# Patient Record
Sex: Female | Born: 1980 | Race: Black or African American | Hispanic: No | Marital: Single | Smoking: Never smoker
Health system: Southern US, Community
[De-identification: ages and names within clinical notes are randomized; demographics above are authoritative.]

## PROBLEM LIST (undated history)

## (undated) DIAGNOSIS — J45909 Unspecified asthma, uncomplicated: Secondary | ICD-10-CM

## (undated) DIAGNOSIS — N62 Hypertrophy of breast: Secondary | ICD-10-CM

## (undated) DIAGNOSIS — Z9889 Other specified postprocedural states: Secondary | ICD-10-CM

## (undated) DIAGNOSIS — O10913 Unspecified pre-existing hypertension complicating pregnancy, third trimester: Secondary | ICD-10-CM

## (undated) DIAGNOSIS — F419 Anxiety disorder, unspecified: Secondary | ICD-10-CM

## (undated) DIAGNOSIS — O039 Complete or unspecified spontaneous abortion without complication: Secondary | ICD-10-CM

## (undated) DIAGNOSIS — R51 Headache: Secondary | ICD-10-CM

## (undated) DIAGNOSIS — R112 Nausea with vomiting, unspecified: Secondary | ICD-10-CM

## (undated) DIAGNOSIS — K219 Gastro-esophageal reflux disease without esophagitis: Secondary | ICD-10-CM

## (undated) DIAGNOSIS — D219 Benign neoplasm of connective and other soft tissue, unspecified: Secondary | ICD-10-CM

## (undated) DIAGNOSIS — R519 Headache, unspecified: Secondary | ICD-10-CM

## (undated) DIAGNOSIS — I10 Essential (primary) hypertension: Secondary | ICD-10-CM

## (undated) HISTORY — PX: NO PAST SURGERIES: SHX2092

## (undated) HISTORY — DX: Complete or unspecified spontaneous abortion without complication: O03.9

## (undated) HISTORY — DX: Benign neoplasm of connective and other soft tissue, unspecified: D21.9

---

## 2004-11-28 ENCOUNTER — Emergency Department: Payer: Self-pay | Admitting: Emergency Medicine

## 2004-12-06 ENCOUNTER — Emergency Department: Payer: Self-pay | Admitting: Emergency Medicine

## 2005-01-19 ENCOUNTER — Emergency Department: Payer: Self-pay | Admitting: Emergency Medicine

## 2005-11-04 ENCOUNTER — Emergency Department: Payer: Self-pay | Admitting: Internal Medicine

## 2006-02-15 ENCOUNTER — Emergency Department: Payer: Self-pay | Admitting: General Practice

## 2006-06-19 ENCOUNTER — Emergency Department: Payer: Self-pay | Admitting: Emergency Medicine

## 2007-03-18 ENCOUNTER — Emergency Department: Payer: Self-pay | Admitting: Emergency Medicine

## 2007-10-17 ENCOUNTER — Emergency Department: Payer: Self-pay | Admitting: Emergency Medicine

## 2007-12-24 ENCOUNTER — Emergency Department: Payer: Self-pay | Admitting: Emergency Medicine

## 2007-12-26 ENCOUNTER — Emergency Department: Payer: Self-pay | Admitting: Emergency Medicine

## 2008-02-18 ENCOUNTER — Emergency Department: Payer: Self-pay | Admitting: Emergency Medicine

## 2008-08-17 ENCOUNTER — Emergency Department: Payer: Self-pay | Admitting: Emergency Medicine

## 2008-09-15 ENCOUNTER — Emergency Department: Payer: Self-pay | Admitting: Emergency Medicine

## 2009-01-23 ENCOUNTER — Emergency Department: Payer: Self-pay | Admitting: Emergency Medicine

## 2009-03-10 ENCOUNTER — Emergency Department: Payer: Self-pay | Admitting: Emergency Medicine

## 2009-04-15 ENCOUNTER — Emergency Department: Payer: Self-pay | Admitting: Emergency Medicine

## 2009-04-28 ENCOUNTER — Emergency Department: Payer: Self-pay | Admitting: Emergency Medicine

## 2009-05-02 ENCOUNTER — Emergency Department: Payer: Self-pay | Admitting: Emergency Medicine

## 2009-05-20 ENCOUNTER — Emergency Department: Payer: Self-pay | Admitting: Internal Medicine

## 2009-05-31 ENCOUNTER — Emergency Department: Payer: Self-pay | Admitting: Internal Medicine

## 2009-07-10 ENCOUNTER — Emergency Department: Payer: Self-pay | Admitting: Emergency Medicine

## 2009-07-15 ENCOUNTER — Emergency Department: Payer: Self-pay | Admitting: Emergency Medicine

## 2009-08-01 ENCOUNTER — Emergency Department: Payer: Self-pay | Admitting: Emergency Medicine

## 2009-08-03 ENCOUNTER — Emergency Department: Payer: Self-pay | Admitting: Emergency Medicine

## 2009-08-07 ENCOUNTER — Emergency Department: Payer: Self-pay | Admitting: Emergency Medicine

## 2009-09-05 ENCOUNTER — Emergency Department: Payer: Self-pay | Admitting: Emergency Medicine

## 2009-10-23 ENCOUNTER — Emergency Department: Payer: Self-pay | Admitting: Emergency Medicine

## 2009-11-02 ENCOUNTER — Emergency Department: Payer: Self-pay | Admitting: Emergency Medicine

## 2009-12-09 ENCOUNTER — Emergency Department: Payer: Self-pay | Admitting: Emergency Medicine

## 2010-03-04 ENCOUNTER — Emergency Department: Payer: Self-pay | Admitting: Emergency Medicine

## 2010-04-20 ENCOUNTER — Emergency Department: Payer: Self-pay

## 2010-09-19 ENCOUNTER — Emergency Department: Payer: Self-pay | Admitting: Emergency Medicine

## 2010-12-07 ENCOUNTER — Emergency Department: Payer: Self-pay | Admitting: Unknown Physician Specialty

## 2011-07-26 ENCOUNTER — Emergency Department: Payer: Self-pay | Admitting: *Deleted

## 2011-07-29 ENCOUNTER — Emergency Department: Payer: Self-pay | Admitting: Internal Medicine

## 2012-08-01 ENCOUNTER — Emergency Department: Payer: Self-pay | Admitting: Emergency Medicine

## 2012-08-01 LAB — BASIC METABOLIC PANEL
BUN: 22 mg/dL — ABNORMAL HIGH (ref 7–18)
Chloride: 108 mmol/L — ABNORMAL HIGH (ref 98–107)
Co2: 27 mmol/L (ref 21–32)
Creatinine: 1.11 mg/dL (ref 0.60–1.30)
Osmolality: 290 (ref 275–301)
Potassium: 4.2 mmol/L (ref 3.5–5.1)

## 2012-08-01 LAB — URINALYSIS, COMPLETE
Ketone: NEGATIVE
Ph: 5 (ref 4.5–8.0)
Protein: 100
RBC,UR: 2776 /HPF (ref 0–5)
WBC UR: 285 /HPF (ref 0–5)

## 2012-08-01 LAB — CBC
HCT: 35.7 % (ref 35.0–47.0)
HGB: 12.3 g/dL (ref 12.0–16.0)
MCV: 89 fL (ref 80–100)
Platelet: 171 10*3/uL (ref 150–440)
RBC: 4.01 10*6/uL (ref 3.80–5.20)
RDW: 13.8 % (ref 11.5–14.5)
WBC: 5.4 10*3/uL (ref 3.6–11.0)

## 2012-08-01 LAB — PREGNANCY, URINE: Pregnancy Test, Urine: NEGATIVE m[IU]/mL

## 2012-10-08 ENCOUNTER — Emergency Department: Payer: Self-pay | Admitting: Emergency Medicine

## 2013-12-18 ENCOUNTER — Ambulatory Visit: Payer: Self-pay | Admitting: Family Medicine

## 2015-06-02 ENCOUNTER — Emergency Department
Admission: EM | Admit: 2015-06-02 | Discharge: 2015-06-02 | Disposition: A | Discharge status: Home / Self Care | Payer: BLUE CROSS/BLUE SHIELD | Attending: Emergency Medicine | Admitting: Emergency Medicine

## 2015-06-02 ENCOUNTER — Encounter: Payer: Self-pay | Admitting: Emergency Medicine

## 2015-06-02 DIAGNOSIS — N390 Urinary tract infection, site not specified: Secondary | ICD-10-CM | POA: Diagnosis not present

## 2015-06-02 DIAGNOSIS — Z3202 Encounter for pregnancy test, result negative: Secondary | ICD-10-CM | POA: Diagnosis not present

## 2015-06-02 DIAGNOSIS — R350 Frequency of micturition: Secondary | ICD-10-CM | POA: Diagnosis present

## 2015-06-02 DIAGNOSIS — Z79899 Other long term (current) drug therapy: Secondary | ICD-10-CM | POA: Diagnosis not present

## 2015-06-02 LAB — URINALYSIS COMPLETE WITH MICROSCOPIC (ARMC ONLY)
Bacteria, UA: NONE SEEN
Bilirubin Urine: NEGATIVE
Glucose, UA: NEGATIVE mg/dL
HGB URINE DIPSTICK: NEGATIVE
KETONES UR: NEGATIVE mg/dL
LEUKOCYTES UA: NEGATIVE
NITRITE: NEGATIVE
Protein, ur: NEGATIVE mg/dL
Specific Gravity, Urine: 1.021 (ref 1.005–1.030)
pH: 5 (ref 5.0–8.0)

## 2015-06-02 LAB — POCT PREGNANCY, URINE: Preg Test, Ur: NEGATIVE

## 2015-06-02 MED ORDER — PHENAZOPYRIDINE HCL 200 MG PO TABS
200.0000 mg | ORAL_TABLET | Freq: Three times a day (TID) | ORAL | Status: DC | PRN
Start: 2015-06-02 — End: 2017-06-21

## 2015-06-02 MED ORDER — SULFAMETHOXAZOLE-TRIMETHOPRIM 800-160 MG PO TABS: 1.0000 | ORAL_TABLET | Freq: Two times a day (BID) | ORAL | Status: DC

## 2015-06-02 MED ORDER — IBUPROFEN 800 MG PO TABS: 800.0000 mg | ORAL_TABLET | Freq: Three times a day (TID) | ORAL | Status: DC | PRN

## 2015-06-02 MED ORDER — METRONIDAZOLE 500 MG PO TABS: 1000.0000 mg | ORAL_TABLET | Freq: Two times a day (BID) | ORAL | Status: DC

## 2015-06-02 NOTE — ED Provider Notes (Signed)
[facility] Emergency Department Provider Note  ____________________________________________  Time seen: Approximately 3:54 PM  I have reviewed the triage vital signs and the nursing notes.   HISTORY  Chief Complaint Urinary Frequency    HPI Gabriela Thomas is a 34 y.o. female presents for evaluation of intermittent urinary frequency for 3 days. Patient states the symptoms started last week and had progressed to today. Complains of dysuria with minimal vaginal discharge.Has a past medical history the same and feels very certain to UTI.   History reviewed. No pertinent past medical history.  There are no active problems to display for this patient.   No past surgical history on file.  Current Outpatient Rx  Name  Route  Sig  Dispense  Refill  . ibuprofen (ADVIL,MOTRIN) 800 MG tablet   Oral   Take 1 tablet (800 mg total) by mouth every 8 (eight) hours as needed.   30 tablet   0   . metroNIDAZOLE (FLAGYL) 500 MG tablet   Oral   Take 2 tablets (1,000 mg total) by mouth 2 (two) times daily.   4 tablet   0   . phenazopyridine (PYRIDIUM) 200 MG tablet   Oral   Take 1 tablet (200 mg total) by mouth 3 (three) times daily as needed for pain.   6 tablet   0   . sulfamethoxazole-trimethoprim (BACTRIM DS,SEPTRA DS) 800-160 MG per tablet   Oral   Take 1 tablet by mouth 2 (two) times daily.   20 tablet   0     Allergies Review of patient's allergies indicates no known allergies.  No family history on file.  Social History History  Substance Use Topics  . Smoking status: Never Smoker   . Smokeless tobacco: Not on file  . Alcohol Use: No    Review of Systems Constitutional: No fever/chills Eyes: No visual changes. ENT: No sore throat. Cardiovascular: Denies chest pain. Respiratory: Denies shortness of breath. Gastrointestinal: No abdominal pain.  No nausea, no vomiting.  No diarrhea.  No constipation. Genitourinary: Positive for  dysuria. Musculoskeletal: Negative for back pain. Skin: Negative for rash. Neurological: Negative for headaches, focal weakness or numbness.  10-point ROS otherwise negative.  ____________________________________________   PHYSICAL EXAM:  VITAL SIGNS: ED Triage Vitals  Enc Vitals Group     BP 06/02/15 1530 150/107 mmHg     Pulse Rate 06/02/15 1530 87     Resp 06/02/15 1530 20     Temp 06/02/15 1530 98.7 F (37.1 C)     Temp Source 06/02/15 1530 Oral     SpO2 06/02/15 1530 97 %     Weight 06/02/15 1530 160 lb (72.576 kg)     Height 06/02/15 1530 5\' 5"  (1.651 m)     Head Cir --      Peak Flow --      Pain Score 06/02/15 1531 8     Pain Loc --      Pain Edu? --      Excl. in [place]? --     Constitutional: Alert and oriented. Well appearing and in no acute distress. Eyes: Conjunctivae are normal. PERRL. EOMI. Head: Atraumatic. Nose: No congestion/rhinnorhea. Mouth/Throat: Mucous membranes are moist.  Oropharynx non-erythematous. Neck: No stridor.   Cardiovascular: Normal rate, regular rhythm. Grossly normal heart sounds.  Good peripheral circulation. Respiratory: Normal respiratory effort.  No retractions. Lungs CTAB. Gastrointestinal: Soft and nontender. No distention. No abdominal bruits. No CVA tenderness. Musculoskeletal: No lower extremity tenderness nor edema.  No joint effusions. Neurologic:  Normal speech and language. No gross focal neurologic deficits are appreciated. Speech is normal. No gait instability. Skin:  Skin is warm, dry and intact. No rash noted. Psychiatric: Mood and affect are normal. Speech and behavior are normal.  ____________________________________________   LABS (all labs ordered are listed, but only abnormal results are displayed)  Labs Reviewed  URINALYSIS COMPLETEWITH MICROSCOPIC ([place] ONLY) - Abnormal; Notable for the following:    Color, Urine YELLOW (*)    APPearance HAZY (*)    Squamous Epithelial / LPF 6-30 (*)    All other  components within normal limits  POCT PREGNANCY, URINE  POC URINE PREG, ED   ____________________________________________  EKG  Deferred ____________________________________________  RADIOLOGY  Not applicable ____________________________________________   PROCEDURES  Procedure(s) performed: None  Critical Care performed: No  ____________________________________________   INITIAL IMPRESSION / ASSESSMENT AND PLAN / ED COURSE  Pertinent labs & imaging results that were available during my care of the patient were reviewed by me and considered in my medical decision making (see chart for details).  Symptoms consistent with UTI. We'll treat prophylactically with Bactrim DS twice a day Pyridium 200 mg 3 times a day and Motrin 800 mg 3 times a day. Flagyl was given at the patient's request for upcoming discharge. ____________________________________________   FINAL CLINICAL IMPRESSION(S) / ED DIAGNOSES  Final diagnoses:  UTI (lower urinary tract infection)      Arlyss Repress, PA-C 06/02/15 1639  Ponciano Ort, MD 06/03/15 0030

## 2015-06-02 NOTE — Discharge Instructions (Signed)

## 2015-06-02 NOTE — ED Notes (Signed)
No fevers

## 2015-06-02 NOTE — ED Notes (Signed)
Low back pain, low abd pain, abd presswure

## 2015-12-15 ENCOUNTER — Encounter: Payer: Self-pay | Admitting: Emergency Medicine

## 2015-12-15 ENCOUNTER — Emergency Department
Admission: EM | Admit: 2015-12-15 | Discharge: 2015-12-15 | Disposition: A | Discharge status: Home / Self Care | Payer: BLUE CROSS/BLUE SHIELD | Attending: Emergency Medicine | Admitting: Emergency Medicine

## 2015-12-15 DIAGNOSIS — IMO0001 Reserved for inherently not codable concepts without codable children: Secondary | ICD-10-CM

## 2015-12-15 DIAGNOSIS — J0110 Acute frontal sinusitis, unspecified: Secondary | ICD-10-CM | POA: Insufficient documentation

## 2015-12-15 DIAGNOSIS — R05 Cough: Secondary | ICD-10-CM | POA: Diagnosis present

## 2015-12-15 DIAGNOSIS — R030 Elevated blood-pressure reading, without diagnosis of hypertension: Secondary | ICD-10-CM | POA: Diagnosis not present

## 2015-12-15 MED ORDER — AMOXICILLIN-POT CLAVULANATE 875-125 MG PO TABS: 1.0000 | ORAL_TABLET | Freq: Two times a day (BID) | ORAL | Status: AC

## 2015-12-15 NOTE — ED Provider Notes (Signed)
[facility] Emergency Department Provider Note  ____________________________________________  Time seen: Approximately 2:15 PM  I have reviewed the triage vital signs and the nursing notes.   HISTORY  Chief Complaint Cough and Nasal Congestion   HPI Gabriela Thomas is a 34 y.o. female is here with complaint of cough congestion for 2 months. Patient states that 2-3 weeks ago she saw her primary care doctorand was placed on some allergy medicine "nasal spray". Patient states that the cough has continued and she has pressure around her face. She states that her ears feel  full and has pressure. Patient is unaware of fever or chills. She denies any past history of bronchitis or pneumonia. Patient is a nonsmoker. Currently she rates her headache and facial pain as 5 out of 10. Facial discomfort is constant.   History reviewed. No pertinent past medical history.  There are no active problems to display for this patient.   History reviewed. No pertinent past surgical history.  Current Outpatient Rx  Name  Route  Sig  Dispense  Refill  . amoxicillin-clavulanate (AUGMENTIN) 875-125 MG tablet   Oral   Take 1 tablet by mouth 2 (two) times daily.   20 tablet   0   . phenazopyridine (PYRIDIUM) 200 MG tablet   Oral   Take 1 tablet (200 mg total) by mouth 3 (three) times daily as needed for pain.   6 tablet   0     Allergies Review of patient's allergies indicates no known allergies.  History reviewed. No pertinent family history.  Social History Social History  Substance Use Topics  . Smoking status: Never Smoker   . Smokeless tobacco: None  . Alcohol Use: No    Review of Systems Constitutional: Unaware of fever/chills Eyes: No visual changes. ENT: No sore throat. Positive facial pain. Positive nasal congestion Cardiovascular: Denies chest pain. Respiratory: Denies shortness of breath. Positive cough Gastrointestinal: No abdominal pain.  No  nausea, no vomiting.  No diarrhea.  No constipation. Musculoskeletal: Negative for back pain. Skin: Negative for rash. Neurological: Positive for normal headaches, no focal weakness or numbness.  10-point ROS otherwise negative.  ____________________________________________   PHYSICAL EXAM:  VITAL SIGNS: ED Triage Vitals  Enc Vitals Group     BP 12/15/15 1343 141/98 mmHg     Pulse Rate 12/15/15 1343 80     Resp 12/15/15 1343 20     Temp 12/15/15 1343 98.8 F (37.1 C)     Temp Source 12/15/15 1343 Oral     SpO2 12/15/15 1343 100 %     Weight 12/15/15 1343 163 lb (73.936 kg)     Height 12/15/15 1343 5\' 5"  (1.651 m)     Head Cir --      Peak Flow --      Pain Score 12/15/15 1344 5     Pain Loc --      Pain Edu? --      Excl. in [place]? --     Constitutional: Alert and oriented. Well appearing and in no acute distress. Eyes: Conjunctivae are normal. PERRL. EOMI. Head: Atraumatic.   Moderate frontal sinus tenderness to percussion. Nose: Mild congestion/rhinnorhea.  EACs are clear bilaterally. TMs are dull with poor light reflex bilaterally. Mouth/Throat: Mucous membranes are moist.  Oropharynx non-erythematous. Posterior drainage present. Neck: No stridor.   Hematological/Lymphatic/Immunilogical: No cervical lymphadenopathy. Cardiovascular: Normal rate, regular rhythm. Grossly normal heart sounds.  Good peripheral circulation. Respiratory: Normal respiratory effort.  No retractions. Lungs CTAB.  Gastrointestinal: Soft and nontender. No distention. Musculoskeletal: Moves upper and lower extremities without any difficulty. Normal gait was noted. Neurologic:  Normal speech and language. No gross focal neurologic deficits are appreciated. No gait instability. Skin:  Skin is warm, dry and intact. No rash noted. Psychiatric: Mood and affect are normal. Speech and behavior are normal.  ____________________________________________   LABS (all labs ordered are listed, but only abnormal  results are displayed)  Labs Reviewed - No data to display   PROCEDURES  Procedure(s) performed: None  Critical Care performed: No  ____________________________________________   INITIAL IMPRESSION / ASSESSMENT AND PLAN / ED COURSE  Pertinent labs & imaging results that were available during my care of the patient were reviewed by me and considered in my medical decision making (see chart for details).   Patient is to continue taking medication for allergies per her medical doctor. She is started on Augmentin 875 twice a day for 10 days. She is to follow-up with [facility] if any continued problems with her sinuses. ____________________________________________   FINAL CLINICAL IMPRESSION(S) / ED DIAGNOSES  Final diagnoses:  Acute frontal sinusitis, recurrence not specified  Elevated blood pressure      Johnn Hai, PA-C 12/15/15 1438  Johnn Hai, PA-C 12/15/15 1439  Daymon Larsen, MD 12/15/15 [phone]

## 2015-12-15 NOTE — ED Notes (Signed)
Reports cough and congestion x 2 months.  States she was given allergy med by her MD but not better.

## 2015-12-15 NOTE — Discharge Instructions (Signed)
Sinusitis, Adult Sinusitis is redness, soreness, and puffiness (inflammation) of the air pockets in the bones of your face (sinuses). The redness, soreness, and puffiness can cause air and mucus to get trapped in your sinuses. This can allow germs to grow and cause an infection.  HOME CARE   Drink enough fluids to keep your pee (urine) clear or pale yellow.  Use a humidifier in your home.  Run a hot shower to create steam in the bathroom. Sit in the bathroom with the door closed. Breathe in the steam 3-4 times a day.  Put a warm, moist washcloth on your face 3-4 times a day, or as told by your doctor.  Use salt water sprays (saline sprays) to wet the thick fluid in your nose. This can help the sinuses drain.  Only take medicine as told by your doctor. GET HELP RIGHT AWAY IF:   Your pain gets worse.  You have very bad headaches.  You are sick to your stomach (nauseous).  You throw up (vomit).  You are very sleepy (drowsy) all the time.  Your face is puffy (swollen).  Your vision changes.  You have a stiff neck.  You have trouble breathing. MAKE SURE YOU:   Understand these instructions.  Will watch your condition.  Will get help right away if you are not doing well or get worse.   This information is not intended to replace advice given to you by your health care provider. Make sure you discuss any questions you have with your health care provider.   Document Released: 05/31/2008 Document Revised: 01/03/2015 Document Reviewed: 07/18/2012 Elsevier Interactive Patient Education 2016 [place] with Dr. Tami Ribas if any continued problems. Continue your allergy medicine as prescribed by your doctor. Augmentin 875 twice a day for 10 days. Tylenol or ibuprofen as needed for headache or aches.

## 2016-10-07 ENCOUNTER — Emergency Department
Admission: EM | Admit: 2016-10-07 | Discharge: 2016-10-07 | Disposition: A | Discharge status: Home / Self Care | Payer: BLUE CROSS/BLUE SHIELD | Attending: Emergency Medicine | Admitting: Emergency Medicine

## 2016-10-07 ENCOUNTER — Encounter: Payer: Self-pay | Admitting: Emergency Medicine

## 2016-10-07 DIAGNOSIS — I10 Essential (primary) hypertension: Secondary | ICD-10-CM | POA: Insufficient documentation

## 2016-10-07 DIAGNOSIS — R300 Dysuria: Secondary | ICD-10-CM | POA: Diagnosis present

## 2016-10-07 DIAGNOSIS — N3090 Cystitis, unspecified without hematuria: Secondary | ICD-10-CM

## 2016-10-07 HISTORY — DX: Essential (primary) hypertension: I10

## 2016-10-07 LAB — URINALYSIS COMPLETE WITH MICROSCOPIC (ARMC ONLY)
Bacteria, UA: NONE SEEN
Bilirubin Urine: NEGATIVE
Glucose, UA: NEGATIVE mg/dL
KETONES UR: NEGATIVE mg/dL
LEUKOCYTES UA: NEGATIVE
NITRITE: POSITIVE — AB
PH: 5 (ref 5.0–8.0)
Protein, ur: 30 mg/dL — AB
SPECIFIC GRAVITY, URINE: 1.025 (ref 1.005–1.030)

## 2016-10-07 LAB — POCT PREGNANCY, URINE: Preg Test, Ur: NEGATIVE

## 2016-10-07 MED ORDER — CEPHALEXIN 500 MG PO CAPS: 500.0000 mg | ORAL_CAPSULE | Freq: Three times a day (TID) | ORAL | 0 refills | Status: DC

## 2016-10-07 NOTE — ED Provider Notes (Signed)
[facility] Emergency Department Provider Note  ____________________________________________   First MD Initiated Contact with Patient 10/07/16 [phone]     (approximate)  I have reviewed the triage vital signs and the nursing notes.   HISTORY  Chief Complaint Dysuria and Back Pain    HPI Gabriela Thomas is a 35 y.o. female is here with complaint of dysuria, frequency and back pain. Patient states it began yesterday as a pressure sensation which increased during the evening. Patient denies any fever or chills. There's been no nausea vomiting. Patient states that she does have a history of urinary tract infections but has never had any problems having them treated. Currently she rates her discomfort as 6/10.   Past Medical History:  Diagnosis Date  . Hypertension     There are no active problems to display for this patient.   No past surgical history on file.  Prior to Admission medications   Medication Sig Start Date End Date Taking? Authorizing Provider  cephALEXin (KEFLEX) 500 MG capsule Take 1 capsule (500 mg total) by mouth 3 (three) times daily. 10/07/16   Johnn Hai, PA-C  phenazopyridine (PYRIDIUM) 200 MG tablet Take 1 tablet (200 mg total) by mouth 3 (three) times daily as needed for pain. 06/02/15   Arlyss Repress, PA-C    Allergies Review of patient's allergies indicates no known allergies.  No family history on file.  Social History Social History  Substance Use Topics  . Smoking status: Never Smoker  . Smokeless tobacco: Not on file  . Alcohol use No    Review of Systems Constitutional: No fever/chills Cardiovascular: Denies chest pain. Respiratory: Denies shortness of breath. Gastrointestinal: No abdominal pain.  No nausea, no vomiting.  Genitourinary: Positive for dysuria Musculoskeletal: Negative for back pain. Skin: Negative for rash. Neurological: Negative for headaches, focal weakness or numbness.  10-point ROS  otherwise negative.  ____________________________________________   PHYSICAL EXAM:  VITAL SIGNS: ED Triage Vitals [10/07/16 0710]  Enc Vitals Group     BP (!) 147/95     Pulse Rate 98     Resp 20     Temp 98.4 F (36.9 C)     Temp Source Oral     SpO2 99 %     Weight 165 lb (74.8 kg)     Height 5\' 5"  (1.651 m)     Head Circumference      Peak Flow      Pain Score 6     Pain Loc      Pain Edu?      Excl. in [place]?     Constitutional: Alert and oriented. Well appearing and in no acute distress. Eyes: Conjunctivae are normal. PERRL. EOMI. Head: Atraumatic. Nose: No congestion/rhinnorhea. Neck: No stridor.   Cardiovascular: Normal rate, regular rhythm. Grossly normal heart sounds.  Good peripheral circulation. Respiratory: Normal respiratory effort.  No retractions. Lungs CTAB. Gastrointestinal: Soft and nontender. No distention.  No CVA tenderness. Musculoskeletal: Moves upper and lower extremities without any difficulty. Neurologic:  Normal speech and language. No gross focal neurologic deficits are appreciated. Normal gait was noted. Skin:  Skin is warm, dry and intact. No rash noted. Psychiatric: Mood and affect are normal. Speech and behavior are normal.  ____________________________________________   LABS (all labs ordered are listed, but only abnormal results are displayed)  Labs Reviewed  URINALYSIS COMPLETEWITH MICROSCOPIC ([place]) - Abnormal; Notable for the following:       Result Value   Color,  Urine AMBER (*)    APPearance CLEAR (*)    Hgb urine dipstick 1+ (*)    Protein, ur 30 (*)    Nitrite POSITIVE (*)    Squamous Epithelial / LPF 0-5 (*)    All other components within normal limits  POC URINE PREG, ED  POCT PREGNANCY, URINE    PROCEDURES  Procedure(s) performed: None  Procedures  Critical Care performed: No  ____________________________________________   INITIAL IMPRESSION / ASSESSMENT AND PLAN / ED COURSE  Pertinent labs &  imaging results that were available during my care of the patient were reviewed by me and considered in my medical decision making (see chart for details).    Clinical Course   Patient was encouraged to increase fluids. Patient was started on Keflex 500 mg 3 times a day for 10 days. She will continue Pyridium that she has at home when necessary. Culture and sensitivity was done.  ____________________________________________   FINAL CLINICAL IMPRESSION(S) / ED DIAGNOSES  Final diagnoses:  Cystitis      NEW MEDICATIONS STARTED DURING THIS VISIT:  Discharge Medication List as of 10/07/2016  8:03 AM    START taking these medications   Details  cephALEXin (KEFLEX) 500 MG capsule Take 1 capsule (500 mg total) by mouth 3 (three) times daily., Starting Thu 10/07/2016, Print         Note:  This document was prepared using Dragon voice recognition software and may include unintentional dictation errors.    Johnn Hai, PA-C 10/07/16 [id number]    Carrie Mew, MD 10/07/16 1515

## 2016-10-07 NOTE — Discharge Instructions (Signed)
Follow-up with your primary care doctor if any continued problems. Increase fluids. Take all of antibiotics for 10 days.

## 2016-10-07 NOTE — ED Triage Notes (Signed)
Pt reports started with dysuria, urinary frequency and back pain and now has come pressure. Reports feels like a UTI.

## 2016-10-14 ENCOUNTER — Encounter: Payer: Self-pay | Admitting: Emergency Medicine

## 2016-10-14 ENCOUNTER — Emergency Department: Payer: BLUE CROSS/BLUE SHIELD

## 2016-10-14 ENCOUNTER — Emergency Department
Admission: EM | Admit: 2016-10-14 | Discharge: 2016-10-14 | Disposition: A | Discharge status: Home / Self Care | Payer: BLUE CROSS/BLUE SHIELD | Attending: Emergency Medicine | Admitting: Emergency Medicine

## 2016-10-14 DIAGNOSIS — R300 Dysuria: Secondary | ICD-10-CM

## 2016-10-14 DIAGNOSIS — Z79899 Other long term (current) drug therapy: Secondary | ICD-10-CM | POA: Diagnosis not present

## 2016-10-14 DIAGNOSIS — N939 Abnormal uterine and vaginal bleeding, unspecified: Secondary | ICD-10-CM | POA: Diagnosis present

## 2016-10-14 DIAGNOSIS — D259 Leiomyoma of uterus, unspecified: Secondary | ICD-10-CM | POA: Diagnosis not present

## 2016-10-14 DIAGNOSIS — I10 Essential (primary) hypertension: Secondary | ICD-10-CM | POA: Insufficient documentation

## 2016-10-14 LAB — CBC
HCT: 36.2 % (ref 35.0–47.0)
HEMOGLOBIN: 12.4 g/dL (ref 12.0–16.0)
MCH: 28.7 pg (ref 26.0–34.0)
MCHC: 34.1 g/dL (ref 32.0–36.0)
MCV: 84 fL (ref 80.0–100.0)
Platelets: 215 10*3/uL (ref 150–440)
RBC: 4.31 MIL/uL (ref 3.80–5.20)
RDW: 14.5 % (ref 11.5–14.5)
WBC: 3.2 10*3/uL — ABNORMAL LOW (ref 3.6–11.0)

## 2016-10-14 LAB — HCG, QUANTITATIVE, PREGNANCY: hCG, Beta Chain, Quant, S: <1 m[IU]/mL (ref <5)

## 2016-10-14 MED ORDER — CIPROFLOXACIN HCL 500 MG PO TABS: 500.0000 mg | ORAL_TABLET | Freq: Two times a day (BID) | ORAL | 0 refills | Status: AC

## 2016-10-14 MED ORDER — MEDROXYPROGESTERONE ACETATE 10 MG PO TABS: 10.0000 mg | ORAL_TABLET | Freq: Every day | ORAL | 0 refills | Status: DC

## 2016-10-14 NOTE — ED Triage Notes (Signed)
Patient presents to the ED with heavy vaginal bleeding that began yesterday.  Patient states her last period started on October 6th and went off on October 12th.  Patient states she was seen on the 14th in the ED and diagnosed with a UTI and prescribed Keflex.  Patient states she started passing blood clots yesterday and is complaining of pelvic, "pressure".  Patient states, "I was thinking it could be this medicine."  Patient denies pain and is in no obvious distress at this time.  Patient's skin is normal color for ethnicity.  Patient reports changing pads approx. Every hour x 2 days.  Patient reports lower back pain as well.

## 2016-10-14 NOTE — ED Notes (Signed)
Pt in via triage with complaints of heavy vaginal bleeding beginning yesterday, "passing clots."  Pt reports last menstrual cycle ending on 10/07/16 and being a normal cycle.  Pt was recently placed on Keflex to treat a UTI, pt concerned that is what is causing her bleeding.  Pt A/Ox4, no immediate distress noted at this time.

## 2016-10-14 NOTE — ED Provider Notes (Signed)
[facility] Emergency Department Provider Note  ____________________________________________  Time seen: Approximately 11:23 AM  I have reviewed the triage vital signs and the nursing notes.   HISTORY  Chief Complaint Vaginal Bleeding    HPI Gabriela Thomas is a 35 y.o. female, NAD, presents to the emergency department with 2 day history of heavy vaginal bleeding.  States the vaginal bleeding starting yesterday and was initially light but then progressively worsened and became heavy with passage of clots.  Notes some diffuse abdominal pressure but states she has a history of constipation at this is similar for such. She was seen in this ED on 10/07/2016 and was treated for a UTI with Keflex.  She works in the [employer] and was told by a pharmacist that Keflex can cause vaginal bleeding and that she needed to come to the emergency department for evaluation.  Her LMP was around October 1st, lasted for approximately 6 days and was normal for her cycles.  She went off birth control in August due to break through bleeding.  She has been having unprotected sex with her boyfriend and the last sexual encounter was over a week ago. Took a home pregnancy test last night which was negative. Last Pap smear was 3 years ago, normal and states she has never had an abnormal pap smear. Next pap smear is due before the end of this year.  Continues to have mild dysuria with increased urinary frequency. Denies any flank pain or back pain. Denies saddle paresthesias or loss of bowel or bladder control. Denies any fevers or chills. Has had no chest pain or shortness of breath.    Past Medical History:  Diagnosis Date  . Hypertension     There are no active problems to display for this patient.   History reviewed. No pertinent surgical history.  Prior to Admission medications   Medication Sig Start Date End Date Taking? Authorizing Provider  cephALEXin (KEFLEX) 500 MG capsule  Take 1 capsule (500 mg total) by mouth 3 (three) times daily. 10/07/16   Johnn Hai, PA-C  ciprofloxacin (CIPRO) 500 MG tablet Take 1 tablet (500 mg total) by mouth 2 (two) times daily. 10/14/16 10/17/16  Bach Rocchi L Kester Stimpson, PA-C  medroxyPROGESTERone (PROVERA) 10 MG tablet Take 1 tablet (10 mg total) by mouth daily. 10/14/16 10/24/16  Kenzy Campoverde L Aubrielle Stroud, PA-C  phenazopyridine (PYRIDIUM) 200 MG tablet Take 1 tablet (200 mg total) by mouth 3 (three) times daily as needed for pain. 06/02/15   Arlyss Repress, PA-C    Allergies Review of patient's allergies indicates no known allergies.  No family history on file.  Social History Social History  Substance Use Topics  . Smoking status: Never Smoker  . Smokeless tobacco: Never Used  . Alcohol use No     Comment: sometimes     Review of Systems  Constitutional: No fever/chills Cardiovascular: No chest pain. Respiratory: No shortness of breath.  Gastrointestinal: Positive diffuse abdominal pressure with history of constipation.  No nausea, vomiting.  No diarrhea.   Genitourinary: Positive for dysuria, vaginal bleeding. No hematuria, urinary hesitancy, urgency or increased frequency. Musculoskeletal: Negative for lower back pain nor flank pain.  Skin: Negative for rash, skin sores. Neurological: Negative for saddle paresthesias nor loss of bowel or bladder control. 10-point ROS otherwise negative.  ____________________________________________   PHYSICAL EXAM:  VITAL SIGNS: ED Triage Vitals  Enc Vitals Group     BP 10/14/16 0950 (!) 142/96     Pulse Rate  10/14/16 0950 93     Resp 10/14/16 0950 20     Temp 10/14/16 0950 98.1 F (36.7 C)     Temp Source 10/14/16 0950 Oral     SpO2 10/14/16 0950 99 %     Weight 10/14/16 0956 165 lb (74.8 kg)     Height 10/14/16 0956 5\' 5"  (1.651 m)     Head Circumference --      Peak Flow --      Pain Score 10/14/16 0957 4     Pain Loc --      Pain Edu? --      Excl. in [place]? --       Constitutional: Alert and oriented. Well appearing and in no acute distress. Eyes: Conjunctivae are normal without icterus or injection Head: Atraumatic. Hematological/Lymphatic/Immunilogical: No cervical lymphadenopathy. Cardiovascular: Normal rate, regular rhythm. Normal S1 and S2. No murmurs, rubs, gallops. Good peripheral circulation. Respiratory: Normal respiratory effort without tachypnea or retractions. Lungs CTAB with breath sounds noted in all lung fields. No wheeze, rhonchi nor rales. Gastrointestinal: Soft and nontender without distention or guarding. No rigidity or rebound. Upper border of uterus is palpable. No CVA tenderness. Musculoskeletal: No lower extremity tenderness nor edema.  No joint effusions. Neurologic:  Normal speech and language. No gross focal neurologic deficits are appreciated.  Skin:  Skin is warm, dry and intact. No rash noted. Psychiatric: Mood and affect are normal. Speech and behavior are normal. Patient exhibits appropriate insight and judgement.   ____________________________________________   LABS (all labs ordered are listed, but only abnormal results are displayed)  Labs Reviewed  CBC - Abnormal; Notable for the following:       Result Value   WBC 3.2 (*)    All other components within normal limits  HCG, QUANTITATIVE, PREGNANCY   ____________________________________________  EKG  None ____________________________________________  RADIOLOGY I, Judithe Modest Teressa Mcglocklin, personally viewed and evaluated these images (plain radiographs) as part of my medical decision making, as well as reviewing the written report by the radiologist.  US Transvaginal Non-ob  Result Date: 10/14/2016 CLINICAL DATA:  Vaginal bleeding for 1 day. LMP 10/01/2016. Premenopausal. EXAM: TRANSABDOMINAL AND TRANSVAGINAL ULTRASOUND OF PELVIS TECHNIQUE: Both transabdominal and transvaginal ultrasound examinations of the pelvis were performed. Transabdominal technique was  performed for global imaging of the pelvis including uterus, ovaries, adnexal regions, and pelvic cul-de-sac. It was necessary to proceed with endovaginal exam following the transabdominal exam to visualize the uterus, endometrium, ovaries. COMPARISON:  Date 08/17/2008 FINDINGS: Uterus Measurements: At least 14.5 x 9.3 x 10.3 cm. Multiple fibroids are present. Three are measured. Large fundal fibroid is 8.0 x 6.4 x 8.7 cm. Right-sided fibroid is 4.7 x 3.6 x 4.3 cm. Left-sided fibroid is 1.7 x 1.1 x 2.2 cm. A heterogeneous mixed echogenicity structure is identified in the region of the cervix, likely representing a nabothian cyst. Endometrium Thickness: 2.3 mm. Fluid is identified within the endometrial canal. Right ovary Measurements: 3.9 x 2.5 x 2.3 cm. Normal appearance/no adnexal mass. Left ovary Measurements: 4.6 x 2.5 x 2.1 cm. Normal appearance/no adnexal mass. Other findings Trace free pelvic fluid is likely physiologic. IMPRESSION: 1. Enlarged uterus containing multiple fibroids. Largest fibroid is 8.7 cm. 2. Normal appearance of both ovaries. 3. Suspect nabothian cyst in the region of the cervix. 4. Normal thickness of the endometrium; fluid within the canal. If bleeding remains unresponsive to hormonal or medical therapy, sonohysterogram should be considered for focal lesion work-up. (Ref: Radiological Reasoning: Algorithmic Workup of Abnormal Vaginal  Bleeding with Endovaginal Sonography and Sonohysterography. AJR [id number]) Electronically Signed   By: Nolon Nations M.D.   On: 10/14/2016 13:19   US Pelvis Complete  Result Date: 10/14/2016 CLINICAL DATA:  Vaginal bleeding for 1 day. LMP 10/01/2016. Premenopausal. EXAM: TRANSABDOMINAL AND TRANSVAGINAL ULTRASOUND OF PELVIS TECHNIQUE: Both transabdominal and transvaginal ultrasound examinations of the pelvis were performed. Transabdominal technique was performed for global imaging of the pelvis including uterus, ovaries, adnexal regions, and  pelvic cul-de-sac. It was necessary to proceed with endovaginal exam following the transabdominal exam to visualize the uterus, endometrium, ovaries. COMPARISON:  Date 08/17/2008 FINDINGS: Uterus Measurements: At least 14.5 x 9.3 x 10.3 cm. Multiple fibroids are present. Three are measured. Large fundal fibroid is 8.0 x 6.4 x 8.7 cm. Right-sided fibroid is 4.7 x 3.6 x 4.3 cm. Left-sided fibroid is 1.7 x 1.1 x 2.2 cm. A heterogeneous mixed echogenicity structure is identified in the region of the cervix, likely representing a nabothian cyst. Endometrium Thickness: 2.3 mm. Fluid is identified within the endometrial canal. Right ovary Measurements: 3.9 x 2.5 x 2.3 cm. Normal appearance/no adnexal mass. Left ovary Measurements: 4.6 x 2.5 x 2.1 cm. Normal appearance/no adnexal mass. Other findings Trace free pelvic fluid is likely physiologic. IMPRESSION: 1. Enlarged uterus containing multiple fibroids. Largest fibroid is 8.7 cm. 2. Normal appearance of both ovaries. 3. Suspect nabothian cyst in the region of the cervix. 4. Normal thickness of the endometrium; fluid within the canal. If bleeding remains unresponsive to hormonal or medical therapy, sonohysterogram should be considered for focal lesion work-up. (Ref: Radiological Reasoning: Algorithmic Workup of Abnormal Vaginal Bleeding with Endovaginal Sonography and Sonohysterography. AJR [id number]) Electronically Signed   By: Nolon Nations M.D.   On: 10/14/2016 13:19    ____________________________________________    PROCEDURES  Procedure(s) performed: None   Procedures   Medications - No data to display   ____________________________________________   INITIAL IMPRESSION / ASSESSMENT AND PLAN / ED COURSE  Pertinent labs & imaging results that were available during my care of the patient were reviewed by me and considered in my medical decision making (see chart for details).  Clinical Course  Comment By Time  OB/GYN on call has been  paged to discuss follow-up for this patient. Braxton Feathers, PA-C 10/19 1350  I spoke with Dr. Clearnce Hasten in regards to the patient. We will discharge her home on oral Provera 10mg  daily x 10 days and have follow up with OBGYN.  Braxton Feathers, PA-C 10/19 1408  Imaging results were discussed with patient. She states that she has had uterine fibroids for many years. States that the last time she had any imaging they were quite small which was years ago. States that over the last few months the first 2-3 days of her menstrual cycle were heavier than previous years. States that she normally sees her primary care provider at [facility] for Pap smears and annual physicals. Explained to the patient that she would need to follow up with Dr. Leafy Ro in OB/GYN for further evaluation and treatment of abnormal uterine bleeding and fibroids. Considering patient has continued to experience dysuria, as well as discontinued Keflex 2 days ago, we will give her a prescription for Cipro 500 mg to take twice a day 3 days to ensure urinary tract infection is eradicated. Braxton Feathers, PA-C 10/19 1430    Patient's diagnosis is consistent with abnormal uterine bleeding and uterine leiomyomas and dysuria. Patient will be discharged home with prescriptions  for Provera and Cipro to take as directed. Patient is to follow up with Dr. Leafy Ro in [facility] for further evaluation and treatment. Patient is given ED precautions to return to the ED for any worsening or new symptoms.    ____________________________________________  FINAL CLINICAL IMPRESSION(S) / ED DIAGNOSES  Final diagnoses:  Abnormal uterine bleeding (AUB)  Uterine leiomyoma, unspecified location  Dysuria      NEW MEDICATIONS STARTED DURING THIS VISIT:  Discharge Medication List as of 10/14/2016  2:28 PM    START taking these medications   Details  ciprofloxacin (CIPRO) 500 MG tablet Take 1 tablet (500 mg total) by mouth 2 (two) times  daily., Starting Thu 10/14/2016, Until Sun 10/17/2016, Print    medroxyPROGESTERone (PROVERA) 10 MG tablet Take 1 tablet (10 mg total) by mouth daily., Starting Thu 10/14/2016, Until Sun 10/24/2016, [place], PA-C 10/14/16 1603    Orbie Pyo, MD 10/14/16 [phone]

## 2017-06-21 ENCOUNTER — Encounter: Payer: Self-pay | Admitting: Obstetrics and Gynecology

## 2017-06-21 ENCOUNTER — Ambulatory Visit (INDEPENDENT_AMBULATORY_CARE_PROVIDER_SITE_OTHER): Payer: BLUE CROSS/BLUE SHIELD | Admitting: Obstetrics and Gynecology

## 2017-06-21 VITALS — BP 170/104 | HR 94 | Ht 65.0 in | Wt 172.0 lb

## 2017-06-21 DIAGNOSIS — D259 Leiomyoma of uterus, unspecified: Secondary | ICD-10-CM

## 2017-06-21 DIAGNOSIS — I10 Essential (primary) hypertension: Secondary | ICD-10-CM | POA: Diagnosis not present

## 2017-06-21 DIAGNOSIS — N920 Excessive and frequent menstruation with regular cycle: Secondary | ICD-10-CM | POA: Diagnosis not present

## 2017-06-21 NOTE — Progress Notes (Signed)
GYNECOLOGY PROGRESS NOTE  Subjective:    Patient ID: Gabriela Thomas, female    DOB: [birth year], 36 y.o.   MRN: [id number]  HPI  Patient is a 36 y.o. G4P1001 female who presents for second opinion for management of uterine fibroids. Has been seen by [facility]. Patient currently receiving Depo-Lupron with add back therapy for symptomatic fibroids (this is her 3rd month) with an initial episode of heavy vaginal bleeding in occurring in October 2017 (seen in the ER at that time).  She has a h/o HTN.    She notes that she does not like the side effects of the Lupron (most significantly noting extreme skin sensitivity, reporting that even her clothes that she wears irritates her skin and feels like something is crawling on her or irritating hr skin).  Does also note a few hot flushes, however this has improved with the initiation of the add-back therapy last month.  Desires to discuss other options.  Patient does note that desires future fertility.  States that she tried Depo Provera in the past (at least 10 years ago for contraception, and noted continuous bleeding for the first month, but afterwards was fine, but still discontinued use).  Notes that she might be willing to try this again. Also inquires about the Nexplanon.    Past Medical History:  Diagnosis Date  . Fibroid   . Hypertension     OB History  Gravida Para Term Preterm AB Living  1 1 1     1   SAB TAB Ectopic Multiple Live Births          1    # Outcome Date GA Lbr Len/2nd Weight Sex Delivery Anes PTL Lv  1 Term 1999 [redacted]w[redacted]d   F Vag-Spont   LIV      Family History  Problem Relation Age of Onset  . Hypertension Mother   . Diabetes Mother   . Congestive Heart Failure Mother   . Hypertension Maternal Aunt   . Hypertension Maternal Uncle   . Hypertension Maternal Grandmother   . Hypertension Maternal Grandfather     Social History   Social History  . Marital status: Single    Spouse name: N/A  .  Number of children: N/A  . Years of education: N/A   Occupational History  . Not on file.   Social History Main Topics  . Smoking status: Never Smoker  . Smokeless tobacco: Never Used  . Alcohol use No     Comment: sometimes  . Drug use: No  . Sexual activity: Yes    Birth control/ protection: None, Condom   Other Topics Concern  . Not on file   Social History Narrative  . No narrative on file    No current outpatient prescriptions on file prior to visit.   No current facility-administered medications on file prior to visit.     No Known Allergies   Review of Systems Pertinent items noted in HPI and remainder of comprehensive ROS otherwise negative.   Objective:   Blood pressure (!) 170/104, pulse 94, height 5\' 5"  (1.651 m), weight 172 lb (78 kg). General appearance: alert and no distress Abdomen: soft, non-tender. Palpable central mass, mobile, extending from the pelvis up to ~ 2 cm below the umbilicus.  Pelvic: deferred Extremities: extremities normal, atraumatic, no cyanosis or edema  Skin: No rashes, ulcers or skin lesions noted. No excessive hirsutism or acne noted.  Neurologic: Grossly normal    Imaging:  CLINICAL  DATA:  Vaginal bleeding for 1 day. LMP 10/01/2016. Premenopausal.  EXAM: TRANSABDOMINAL AND TRANSVAGINAL ULTRASOUND OF PELVIS  TECHNIQUE: Both transabdominal and transvaginal ultrasound examinations of the pelvis were performed. Transabdominal technique was performed for global imaging of the pelvis including uterus, ovaries, adnexal regions, and pelvic cul-de-sac. It was necessary to proceed with endovaginal exam following the transabdominal exam to visualize the uterus, endometrium, ovaries.  COMPARISON:  Date 08/17/2008  FINDINGS: Uterus  Measurements: At least 14.5 x 9.3 x 10.3 cm. Multiple fibroids are present. Three are measured. Large fundal fibroid is 8.0 x 6.4 x 8.7 cm. Right-sided fibroid is 4.7 x 3.6 x 4.3 cm.  Left-sided fibroid is 1.7 x 1.1 x 2.2 cm.  A heterogeneous mixed echogenicity structure is identified in the region of the cervix, likely representing a nabothian cyst.  Endometrium  Thickness: 2.3 mm. Fluid is identified within the endometrial canal.  Right ovary  Measurements: 3.9 x 2.5 x 2.3 cm. Normal appearance/no adnexal mass.  Left ovary  Measurements: 4.6 x 2.5 x 2.1 cm. Normal appearance/no adnexal mass.  Other findings  Trace free pelvic fluid is likely physiologic.  IMPRESSION: 1. Enlarged uterus containing multiple fibroids. Largest fibroid is 8.7 cm. 2. Normal appearance of both ovaries. 3. Suspect nabothian cyst in the region of the cervix. 4. Normal thickness of the endometrium; fluid within the canal. If bleeding remains unresponsive to hormonal or medical therapy, sonohysterogram should be considered for focal lesion work-up. (Ref: Radiological Reasoning: Algorithmic Workup of Abnormal Vaginal Bleeding with Endovaginal Sonography and Sonohysterography. AJR 2008; [id number])  Assessment:   Enlarged fibroid uterus Heavy menstrual bleeding Hypertension  Plan:   - Patient currently receiving Depo-Lupron for treatment of large fibroids and heavy menstrual bleeding. Currently, patient noting side effects that she is not sure that she can continue with it's use.  Discussed that often times side effects lessen, the longer she is on the therapy, especially now that she has also begun receiving add-back therapy. Patient recently received 3rd injection.  Has at least 1 month to decide if she desires to continue current therapy, or change to something different.  Discussed that Lupron would help with any option she decides by reducing fibroid size and controlling the excessive bleeding.  Has previously been counseled at [facility] clinic regarding other options (OCPs (progesterone only due to patient's h/o uncontrolled HTN), IUD (although with enlarged uterus  this may not be the best option).  Also discussed Neplanon (although it may not help with fibroids, may help with management of periods), Depo Provera (although patient notes trying in the past), UFE, and myomectomy.  Patient does not desire definitive management with hysterectomy as she desires to maintain her fertility.  Patient notes that she will think over all her options.  Given handouts on all options. Notes that she is considering conception sometime next year. Advised that shortly prior to that time she may want to consider a myomectomy due to the size of several of the fibroids, if the Lupron has not helped to reduce them in size, as enlarged fibroids could increase her risk of miscarriage depending on their location.   Patient notes understanding.  She is scheduled to repeat an ultrasound after her third injection of Lupron in 1 month. - Hypertension uncontrolled.  Patient has an appointment with a PCP next week to establish care and manage her BP.    Rubie Maid, MD [facility]

## 2017-10-26 ENCOUNTER — Ambulatory Visit (INDEPENDENT_AMBULATORY_CARE_PROVIDER_SITE_OTHER): Payer: BLUE CROSS/BLUE SHIELD | Admitting: Obstetrics and Gynecology

## 2017-10-26 ENCOUNTER — Encounter: Payer: Self-pay | Admitting: Obstetrics and Gynecology

## 2017-10-26 VITALS — BP 151/88 | HR 73 | Ht 65.0 in | Wt 168.9 lb

## 2017-10-26 DIAGNOSIS — N920 Excessive and frequent menstruation with regular cycle: Secondary | ICD-10-CM | POA: Diagnosis not present

## 2017-10-26 DIAGNOSIS — D259 Leiomyoma of uterus, unspecified: Secondary | ICD-10-CM

## 2017-10-26 NOTE — Progress Notes (Signed)
    GYNECOLOGY PROGRESS NOTE  Subjective:    Patient ID: Gabriela Thomas, female    DOB: [birth year], 36 y.o.   MRN: [id number]  HPI  Patient is a 36 y.o. G93P1001 female with PMH of HTN who presents for f/u of uterine fibroids.  Patient was seen in June as a second opinion regarding fibroid management.  Presents today stating that she is planning on transitioning her care to [facility]. Patient has been receiving treatment with Lupron over the past 6 months.  Notes that after the second injection she then began having vasomotor symptoms and was started on add-back therapy.  Notes symptoms improved, but then began having cycles again around month 4 and was started on Lysteda for cycles.  Patient states that her GYN has been recommending hysterectomy, however Notnamed notes that she is just not ready for this step. States she has also discussed myomectomy with her GYN.  Would consider this option, but is somewhat afraid of having a major surgery.  Desires to discuss other options again .   The following portions of the patient's history were reviewed and updated as appropriate: allergies, current medications, past family history, past medical history, past social history, past surgical history and problem list.  Review of Systems Pertinent items noted in HPI and remainder of comprehensive ROS otherwise negative.   Objective:   Blood pressure (!) 151/88, pulse 73, height 5\' 5"  (1.651 m), weight 168 lb 14.4 oz (76.6 kg), last menstrual period 10/12/2017. General appearance: alert and no distress Abdomen: normal findings: bowel sounds normal and soft, non-tender and abnormal findings:  palpable mass 14-16 week sized arising from pelvis.  Pelvic: deferred   Imaging:  Unable to review imaging in CareEverywhere.  Review of notes from previous provider notes 14 cm uterus with multiple fibroids, largest is fundal >8cm (~ March or A2018).  Ultrasound in July 2018 found a 16cm uterus with multiple fibroids,  the largest is at the fundus and continues to be 10 cm   Assessment:   FIbroid uterus Menorrhagia with regular cycle  Plan:   1. Fibroid uterus - patient has completed last injection of Lupron (was given 3 month dose at last visit in July). Has not yet had f/u ultrasound.  Will order to reassess size of uterine fibroids. Patient notes that she is not completely sold on the idea of myomectomy as she would likely require an open procedure.  Declines hysterectomy as she would like to preserve fertility.  Discussed other options, including hormonal management, uterine artery embolization (although not an immediate solution but can be considered once patient no longer desires fertility).  Patient desires to think about her options.  2. Abnormal uterine bleeding. Currently taking Lysteda for bleeding.  Notes that it helps some (decreased the flow), however does feel like it worsens her cramping.  Cramping managed with OTC meds. Was previously counseled regarding starting Depo Provera for bleeding but patient notes she had tried Depo Provera in the remote past, which caused her bleeding to become worse.  Is considering trying Nexplanon.  . To f/u after ultrasound, will determine next viable steps.    A total of 15 minutes were spent face-to-face with the patient during this encounter and over half of that time dealt with counseling and coordination of care.

## 2017-11-07 ENCOUNTER — Other Ambulatory Visit: Payer: BLUE CROSS/BLUE SHIELD

## 2017-11-08 ENCOUNTER — Ambulatory Visit (INDEPENDENT_AMBULATORY_CARE_PROVIDER_SITE_OTHER): Payer: BLUE CROSS/BLUE SHIELD

## 2017-11-08 DIAGNOSIS — D259 Leiomyoma of uterus, unspecified: Secondary | ICD-10-CM

## 2017-11-11 ENCOUNTER — Encounter: Payer: Self-pay | Admitting: Emergency Medicine

## 2017-11-11 ENCOUNTER — Emergency Department
Admission: EM | Admit: 2017-11-11 | Discharge: 2017-11-11 | Disposition: A | Discharge status: Home / Self Care | Payer: BLUE CROSS/BLUE SHIELD | Attending: Emergency Medicine | Admitting: Emergency Medicine

## 2017-11-11 DIAGNOSIS — M436 Torticollis: Secondary | ICD-10-CM | POA: Insufficient documentation

## 2017-11-11 DIAGNOSIS — I10 Essential (primary) hypertension: Secondary | ICD-10-CM | POA: Insufficient documentation

## 2017-11-11 DIAGNOSIS — R51 Headache: Secondary | ICD-10-CM | POA: Diagnosis present

## 2017-11-11 MED ORDER — BUTALBITAL-APAP-CAFFEINE 50-325-40 MG PO TABS
2.0000 | ORAL_TABLET | Freq: Once | ORAL | Status: AC
  Administered 2017-11-11: 2 via ORAL
  Filled 2017-11-11: qty 2

## 2017-11-11 MED ORDER — DIAZEPAM 5 MG PO TABS
5.0000 mg | ORAL_TABLET | Freq: Once | ORAL | Status: AC
  Administered 2017-11-11: 5 mg via ORAL
  Filled 2017-11-11: qty 1

## 2017-11-11 MED ORDER — BUTALBITAL-APAP-CAFFEINE 50-325-40 MG PO TABS: 1.0000 | ORAL_TABLET | Freq: Four times a day (QID) | ORAL | 0 refills | Status: DC | PRN

## 2017-11-11 MED ORDER — DIAZEPAM 5 MG PO TABS: 5.0000 mg | ORAL_TABLET | Freq: Three times a day (TID) | ORAL | 0 refills | Status: DC | PRN

## 2017-11-11 NOTE — ED Triage Notes (Signed)
Pt comes into the ED via POV c/o headache x3 days.  Denies any N/V/D.  Patient has h/o vertigo and started her meclizine today.  States that she has frequent history of migraines.  Today she states the headache is worse when she looks to the left.  Denies any tension headaches that she is aware of, but states the side of her neck and her trap are sore to touch.  Patient is neurologically intact at this time and denies chest pain at this time.

## 2017-11-11 NOTE — ED Provider Notes (Signed)
[facility] Emergency Department Provider Note       Time seen: ----------------------------------------- 2:51 PM on 11/11/2017 -----------------------------------------    I have reviewed the triage vital signs and the nursing notes.  HISTORY   Chief Complaint Headache   HPI Gabriela Thomas is a 36 y.o. female with a history of headache for 3 days.  Patient denies any fevers, chills, nausea, vomiting or diarrhea.  She reports a history of vertigo and started some meclizine today.  She has a frequent history of migraines that are frontal but the pain today is along the left side of her neck and posterior scalp.  Patient reports she has not had pain before that is tender to touch.  Past Medical History:  Diagnosis Date  . Fibroid   . Hypertension     There are no active problems to display for this patient.   History reviewed. No pertinent surgical history.  Allergies Patient has no known allergies.  Social History Social History   Tobacco Use  . Smoking status: Never Smoker  . Smokeless tobacco: Never Used  Substance Use Topics  . Alcohol use: No    Comment: sometimes  . Drug use: No    Review of Systems Constitutional: Negative for fever. Eyes: Negative for vision changes ENT:  Negative for congestion, sore throat Cardiovascular: Negative for chest pain. Respiratory: Negative for shortness of breath. Gastrointestinal: Negative for abdominal pain, vomiting and diarrhea. Genitourinary: Negative for dysuria. Musculoskeletal: Positive for neck pain Skin: Negative for rash. Neurological: Negative for headaches, focal weakness or numbness.  All systems negative/normal/unremarkable except as stated in the HPI  ____________________________________________   PHYSICAL EXAM:  VITAL SIGNS: ED Triage Vitals  Enc Vitals Group     BP 11/11/17 1133 (!) 191/105     Pulse Rate 11/11/17 1133 90     Resp 11/11/17 1133 16     Temp 11/11/17  1133 99 F (37.2 C)     Temp Source 11/11/17 1133 Oral     SpO2 11/11/17 1133 100 %     Weight 11/11/17 1134 170 lb (77.1 kg)     Height 11/11/17 1134 5\' 5"  (1.651 m)     Head Circumference --      Peak Flow --      Pain Score 11/11/17 1137 8     Pain Loc --      Pain Edu? --      Excl. in [place]? --     Constitutional: Alert and oriented. Well appearing and in no distress. Eyes: Conjunctivae are normal. Normal extraocular movements. ENT   Head: Normocephalic and atraumatic.   Nose: No congestion/rhinnorhea.   Mouth/Throat: Mucous membranes are moist.   Neck: No stridor. Cardiovascular: Normal rate, regular rhythm. No murmurs, rubs, or gallops. Respiratory: Normal respiratory effort without tachypnea nor retractions. Breath sounds are clear and equal bilaterally. No wheezes/rales/rhonchi. Musculoskeletal: Nontender with normal range of motion in extremities. No lower extremity tenderness nor edema.  Left-sided trapezius, paraspinous and occipitalis muscle tenderness. Neurologic:  Normal speech and language. No gross focal neurologic deficits are appreciated.  Strength and cranial nerves appear to be normal Skin:  Skin is warm, dry and intact. No rash noted. Psychiatric: Mood and affect are normal. Speech and behavior are normal.  ____________________________________________  ED COURSE:  Pertinent labs & imaging results that were available during my care of the patient were reviewed by me and considered in my medical decision making (see chart for details). Patient presents for neck  pain, we will assess with labs and imaging as indicated.   Procedures ____________________________________________  DIFFERENTIAL DIAGNOSIS   Migraine, torticollis, tension headache, muscle spasm, vertigo  FINAL ASSESSMENT AND PLAN  Torticollis   Plan: Patient had presented for headache and neck pain.  Clinically she appears very well and this seems very reproducible and superficial.   She has tenderness along the left trapezius as well as the paraspinous muscles and scalp tenderness.  Advised heating pad, massage and stretching with muscle relaxants.  She is stable for outpatient follow-up with her doctor.   Earleen Newport, MD   Note: This note was generated in part or whole with voice recognition software. Voice recognition is usually quite accurate but there are transcription errors that can and very often do occur. I apologize for any typographical errors that were not detected and corrected.     Earleen Newport, MD 11/11/17 [phone]

## 2017-11-13 ENCOUNTER — Encounter: Payer: Self-pay | Admitting: Emergency Medicine

## 2017-11-13 ENCOUNTER — Emergency Department
Admission: EM | Admit: 2017-11-13 | Discharge: 2017-11-13 | Disposition: A | Discharge status: Home / Self Care | Payer: BLUE CROSS/BLUE SHIELD | Attending: Emergency Medicine | Admitting: Emergency Medicine

## 2017-11-13 DIAGNOSIS — Y939 Activity, unspecified: Secondary | ICD-10-CM | POA: Insufficient documentation

## 2017-11-13 DIAGNOSIS — Y929 Unspecified place or not applicable: Secondary | ICD-10-CM | POA: Diagnosis not present

## 2017-11-13 DIAGNOSIS — X58XXXA Exposure to other specified factors, initial encounter: Secondary | ICD-10-CM | POA: Insufficient documentation

## 2017-11-13 DIAGNOSIS — S134XXA Sprain of ligaments of cervical spine, initial encounter: Secondary | ICD-10-CM | POA: Insufficient documentation

## 2017-11-13 DIAGNOSIS — I10 Essential (primary) hypertension: Secondary | ICD-10-CM | POA: Diagnosis not present

## 2017-11-13 DIAGNOSIS — Z79899 Other long term (current) drug therapy: Secondary | ICD-10-CM | POA: Insufficient documentation

## 2017-11-13 DIAGNOSIS — R51 Headache: Secondary | ICD-10-CM | POA: Diagnosis present

## 2017-11-13 DIAGNOSIS — Y999 Unspecified external cause status: Secondary | ICD-10-CM | POA: Insufficient documentation

## 2017-11-13 DIAGNOSIS — S139XXA Sprain of joints and ligaments of unspecified parts of neck, initial encounter: Secondary | ICD-10-CM

## 2017-11-13 MED ORDER — NAPROXEN 500 MG PO TABS: 500.0000 mg | ORAL_TABLET | Freq: Two times a day (BID) | ORAL | 2 refills | Status: DC

## 2017-11-13 MED ORDER — KETOROLAC TROMETHAMINE 30 MG/ML IJ SOLN
30.0000 mg | Freq: Once | INTRAMUSCULAR | Status: AC
  Administered 2017-11-13: 30 mg via INTRAMUSCULAR
  Filled 2017-11-13: qty 1

## 2017-11-13 NOTE — ED Provider Notes (Signed)
[facility] Emergency Department Provider Note   ____________________________________________    I have reviewed the triage vital signs and the nursing notes.   HISTORY  Chief Complaint Headache    HPI Gabriela Thomas is a 36 y.o. female who presents with complaints of headache.  However primarily she is complaining of throbbing pain on the posterior head at the insertion site of the left trapezius muscle which is worse with turning her head to the left.  This is similar to her complaint when she was here several days ago.  She reports she has been taking the medications prescribed but pain is only slightly improved.  She is frustrated that is not better yet.  She denies fevers or chills.  No neuro deficits.  She denies photophobia to me  Past Medical History:  Diagnosis Date  . Fibroid   . Hypertension     There are no active problems to display for this patient.   History reviewed. No pertinent surgical history.  Prior to Admission medications   Medication Sig Start Date End Date Taking? Authorizing Provider  amLODipine (NORVASC) 5 MG tablet Take by mouth.    [provider]  butalbital-acetaminophen-caffeine (FIORICET, ESGIC) 50-325-40 MG tablet Take 1-2 tablets every 6 (six) hours as needed by mouth for headache. 11/11/17 11/11/18  Earleen Newport, MD  diazepam (VALIUM) 5 MG tablet Take 1 tablet (5 mg total) every 8 (eight) hours as needed by mouth for muscle spasms. 11/11/17   Earleen Newport, MD  leuprolide (LUPRON DEPOT, 77-MONTH,) 3.75 MG injection  05/30/17   [provider]  losartan (COZAAR) 50 MG tablet Take by mouth. 09/09/17 09/09/18  [provider]  naproxen (NAPROSYN) 500 MG tablet Take 1 tablet (500 mg total) 2 (two) times daily with a meal by mouth. 11/13/17   Lavonia Drafts, MD     Allergies Patient has no known allergies.  Family History  Problem Relation Age of Onset  . Hypertension Mother    . Diabetes Mother   . Congestive Heart Failure Mother   . Hypertension Maternal Aunt   . Hypertension Maternal Uncle   . Hypertension Maternal Grandmother   . Hypertension Maternal Grandfather     Social History Social History   Tobacco Use  . Smoking status: Never Smoker  . Smokeless tobacco: Never Used  Substance Use Topics  . Alcohol use: No    Comment: sometimes  . Drug use: No    Review of Systems  Constitutional: No fever/chills  ENT: Neck pain as above   Gastrointestinal:  No nausea, no vomiting.   Genitourinary: Negative for dysuria. Musculoskeletal: Negative for back pain. Skin: Negative for rash. Neurological: Negative for neuro deficits    ____________________________________________   PHYSICAL EXAM:  VITAL SIGNS: ED Triage Vitals  Enc Vitals Group     BP 11/13/17 1815 (!) 173/123     Pulse Rate 11/13/17 1815 80     Resp 11/13/17 1815 20     Temp 11/13/17 1815 98 F (36.7 C)     Temp Source 11/13/17 1815 Oral     SpO2 11/13/17 1815 99 %     Weight --      Height --      Head Circumference --      Peak Flow --      Pain Score 11/13/17 1820 10     Pain Loc --      Pain Edu? --      Excl.  in [place]? --     Constitutional: Alert and oriented. No acute distress. Pleasant and interactive Eyes: Conjunctivae are normal.  PERRLA, EOMI Head: Atraumatic. Nose: No congestion/rhinnorhea. Mouth/Throat: Mucous membranes are moist.   Cardiovascular: Normal rate, regular rhythm.  Respiratory: Normal respiratory effort.  No retractions. Genitourinary: deferred Musculoskeletal: Tenderness to palpation of the insertion site of the left trapezius muscle, no fluctuance or erythema.  No vertebral tenderness palpation. Neurologic:  Normal speech and language. No gross focal neurologic deficits are appreciated.   Skin:  Skin is warm, dry and intact. No rash noted.   ____________________________________________   LABS (all labs ordered are listed, but only  abnormal results are displayed)  Labs Reviewed - No data to display ____________________________________________  EKG   ____________________________________________  RADIOLOGY  None ____________________________________________   PROCEDURES  Procedure(s) performed: No  Procedures   Critical Care performed: No ____________________________________________   INITIAL IMPRESSION / ASSESSMENT AND PLAN / ED COURSE  Pertinent labs & imaging results that were available during my care of the patient were reviewed by me and considered in my medical decision making (see chart for details).  Exam is most consistent with muscular skeletal pain, patient given IM Toradol with significant improvement.  I will add on naproxen to her medication regimen, recommend outpatient follow-up PCP   ____________________________________________   FINAL CLINICAL IMPRESSION(S) / ED DIAGNOSES  Final diagnoses:  Cervical sprain, initial encounter      NEW MEDICATIONS STARTED DURING THIS VISIT:  This SmartLink is deprecated. Use AVSMEDLIST instead to display the medication list for a patient.   Note:  This document was prepared using Dragon voice recognition software and may include unintentional dictation errors.    Lavonia Drafts, MD 11/13/17 2038

## 2017-11-13 NOTE — ED Triage Notes (Signed)
Pt in via POV with complaints of headache, seen on Friday for same, prescribed fioricet and valium but without any relief.  Pt reports nausea, photosensitivity.  Pt hypertensive upon arrival, other vitals WDL.  NAD noted at this time.

## 2017-11-15 ENCOUNTER — Encounter: Payer: BLUE CROSS/BLUE SHIELD | Admitting: Obstetrics and Gynecology

## 2017-11-15 ENCOUNTER — Telehealth: Payer: Self-pay | Admitting: Obstetrics and Gynecology

## 2017-11-15 MED ORDER — NORETHIN ACE-ETH ESTRAD-FE 1-20 MG-MCG PO TABS: 1.0000 | ORAL_TABLET | Freq: Every day | ORAL | 11 refills | Status: DC

## 2017-11-15 NOTE — Telephone Encounter (Signed)
Spoke with pt today and she states she wants to go with the low dose birth control option. Spoke with Dr. Marcelline Mates in the office and gave verbal for low dose junel to be sent in. States this will help with bleeding and fibroids.  Pt agreed to medication. Rx was sent to her pharmacy of choice.  Notes recorded by Rubie Maid, MD on 11/09/2017 at 10:56 PM EST Please inform patient that her fibroids have shrunk some since her last ultrasound. Her uterus has decreased in size, by ~ 2-3 cm, as well as each of the fibroids has decreased in size by 2-3 cm. Please let us know know what her plans are with regards to further management so that we can schedule her next appointment accordingly

## 2018-01-04 ENCOUNTER — Encounter: Payer: BLUE CROSS/BLUE SHIELD | Admitting: Obstetrics and Gynecology

## 2018-04-02 ENCOUNTER — Emergency Department
Admission: EM | Admit: 2018-04-02 | Discharge: 2018-04-02 | Disposition: A | Discharge status: Home / Self Care | Payer: BLUE CROSS/BLUE SHIELD | Attending: Emergency Medicine | Admitting: Emergency Medicine

## 2018-04-02 ENCOUNTER — Encounter: Payer: Self-pay | Admitting: Emergency Medicine

## 2018-04-02 DIAGNOSIS — Z79899 Other long term (current) drug therapy: Secondary | ICD-10-CM | POA: Insufficient documentation

## 2018-04-02 DIAGNOSIS — I10 Essential (primary) hypertension: Secondary | ICD-10-CM | POA: Diagnosis not present

## 2018-04-02 DIAGNOSIS — M436 Torticollis: Secondary | ICD-10-CM | POA: Diagnosis present

## 2018-04-02 MED ORDER — HYDROCODONE-ACETAMINOPHEN 5-325 MG PO TABS
1.0000 | ORAL_TABLET | Freq: Once | ORAL | Status: AC
  Administered 2018-04-02: 1 via ORAL
  Filled 2018-04-02: qty 1

## 2018-04-02 MED ORDER — IBUPROFEN 600 MG PO TABS: 600.0000 mg | ORAL_TABLET | Freq: Three times a day (TID) | ORAL | 0 refills | Status: DC | PRN

## 2018-04-02 MED ORDER — METHOCARBAMOL 500 MG PO TABS: 500.0000 mg | ORAL_TABLET | Freq: Four times a day (QID) | ORAL | 0 refills | Status: DC | PRN

## 2018-04-02 MED ORDER — KETOROLAC TROMETHAMINE 30 MG/ML IJ SOLN
30.0000 mg | Freq: Once | INTRAMUSCULAR | Status: AC
  Administered 2018-04-02: 30 mg via INTRAMUSCULAR
  Filled 2018-04-02: qty 1

## 2018-04-02 MED ORDER — HYDROCODONE-ACETAMINOPHEN 5-325 MG PO TABS: 1.0000 | ORAL_TABLET | Freq: Four times a day (QID) | ORAL | 0 refills | Status: DC | PRN

## 2018-04-02 MED ORDER — METHOCARBAMOL 500 MG PO TABS
1000.0000 mg | ORAL_TABLET | Freq: Once | ORAL | Status: AC
  Administered 2018-04-02: 1000 mg via ORAL
  Filled 2018-04-02: qty 2

## 2018-04-02 NOTE — Discharge Instructions (Signed)
Follow-up with your primary care provider at [facility] if any continued problems.  Continue taking medication only as directed.  Norco 1 every 6 hours as needed for moderate pain, ibuprofen 600 mg every 8 hours for pain and inflammation and Robaxin 500 mg every 6 hours for muscle spasms.  Discontinue taking medication at home at this time.  Use warm compresses or ice packs to your neck as needed for discomfort.

## 2018-04-02 NOTE — ED Provider Notes (Signed)
[facility] Emergency Department Provider Note   ____________________________________________   First MD Initiated Contact with Patient 04/02/18 1058     (approximate)  I have reviewed the triage vital signs and the nursing notes.   HISTORY  Chief Complaint Torticollis   HPI Gabriela Thomas is a 37 y.o. female is here with complaint of muscle spasms in her neck for the last 3 days.  Patient states that she took a muscle relaxant last night without any improvement.  Patient states that she has had this problem in the past and was told that she is under a lot of stress.  Patient also took ibuprofen last evening with the muscle relaxant.  She denies any recent injury to her neck or paresthesias.  She rates her pain as 10/10.   Past Medical History:  Diagnosis Date  . Fibroid   . Hypertension     There are no active problems to display for this patient.   History reviewed. No pertinent surgical history.  Prior to Admission medications   Medication Sig Start Date End Date Taking? Authorizing Provider  amLODipine (NORVASC) 5 MG tablet Take by mouth.    [provider]  HYDROcodone-acetaminophen (NORCO/VICODIN) 5-325 MG tablet Take 1 tablet by mouth every 6 (six) hours as needed for moderate pain. 04/02/18   Johnn Hai, PA-C  ibuprofen (ADVIL,MOTRIN) 600 MG tablet Take 1 tablet (600 mg total) by mouth every 8 (eight) hours as needed. 04/02/18   Johnn Hai, PA-C  leuprolide (LUPRON DEPOT, 56-MONTH,) 3.75 MG injection  05/30/17   [provider]  losartan (COZAAR) 50 MG tablet Take by mouth. 09/09/17 09/09/18  [provider]  methocarbamol (ROBAXIN) 500 MG tablet Take 1 tablet (500 mg total) by mouth every 6 (six) hours as needed for muscle spasms. 04/02/18   Johnn Hai, PA-C  norethindrone-ethinyl estradiol (JUNEL FE 1/20) 1-20 MG-MCG tablet Take 1 tablet by mouth daily. 11/15/17   Rubie Maid, MD     Allergies Patient has no known allergies.  Family History  Problem Relation Age of Onset  . Hypertension Mother   . Diabetes Mother   . Congestive Heart Failure Mother   . Hypertension Maternal Aunt   . Hypertension Maternal Uncle   . Hypertension Maternal Grandmother   . Hypertension Maternal Grandfather     Social History Social History   Tobacco Use  . Smoking status: Never Smoker  . Smokeless tobacco: Never Used  Substance Use Topics  . Alcohol use: No    Comment: sometimes  . Drug use: No    Review of Systems Constitutional: No fever/chills Eyes: No visual changes. ENT: No sore throat. Cardiovascular: Denies chest pain. Respiratory: Denies shortness of breath. Gastrointestinal: No abdominal pain.  No nausea, no vomiting.  Musculoskeletal: Positive for muscle spasms right lateral neck. Skin: Negative for rash. Neurological: Negative for headaches, focal weakness or numbness. ____________________________________________   PHYSICAL EXAM:  VITAL SIGNS: ED Triage Vitals  Enc Vitals Group     BP 04/02/18 1034 (!) 165/99     Pulse Rate 04/02/18 1034 88     Resp 04/02/18 1034 18     Temp 04/02/18 1034 98.8 F (37.1 C)     Temp Source 04/02/18 1034 Oral     SpO2 04/02/18 1034 96 %     Weight 04/02/18 1047 170 lb (77.1 kg)     Height 04/02/18 1047 5\' 5"  (1.651 m)     Head Circumference --  Peak Flow --      Pain Score 04/02/18 1046 10     Pain Loc --      Pain Edu? --      Excl. in [place]? --     Constitutional: Alert and oriented. Well appearing and in no acute distress. Eyes: Conjunctivae are normal.  Head: Atraumatic. Nose: No congestion/rhinnorhea. Mouth/Throat: Mucous membranes are moist.  Oropharynx non-erythematous. Neck: No stridor.  Nontender cervical spine to palpation posteriorly.  Range of motion laterally is slightly restricted however patient is able to flex and extend with minimal discomfort.  On palpation of the right lateral cervical  muscles and trapezius there is soft tissue tenderness.  No gross deformity or discoloration of the skin. Hematological/Lymphatic/Immunilogical: No cervical lymphadenopathy. Cardiovascular: Normal rate, regular rhythm. Grossly normal heart sounds.  Good peripheral circulation. Respiratory: Normal respiratory effort.  No retractions. Lungs CTAB. Musculoskeletal: Moves upper and lower extremities without any difficulty.  Normal gait was noted. Neurologic:  Normal speech and language. No gross focal neurologic deficits are appreciated. No gait instability. Skin:  Skin is warm, dry and intact. No rash noted. Psychiatric: Mood and affect are normal. Speech and behavior are normal.  ____________________________________________   LABS (all labs ordered are listed, but only abnormal results are displayed)  Labs Reviewed - No data to display   PROCEDURES  Procedure(s) performed: None  Procedures  Critical Care performed: No  ____________________________________________   INITIAL IMPRESSION / ASSESSMENT AND PLAN / ED COURSE  As part of my medical decision making, I reviewed the following data within the electronic MEDICAL RECORD NUMBER Notes from prior ED visits and [place] Controlled Substance Database  Patient was seen for torticollis and given Toradol 30 mg IM along with Norco and methocarbamol 1000 mg p.o.  Patient was improving at the time of discharge.  She is encouraged to continue taking medication and also use ice or heat to her neck as needed for discomfort.  She is to follow-up with her PCP at [facility] if any continued problems.  Patient was discharged with prescription for Norco as needed for pain and Robaxin as a muscle relaxant.  At this time she will discontinue taking the muscle relaxant that she was taking at home as it did not give any relief. ____________________________________________    FINAL CLINICAL IMPRESSION(S) / ED DIAGNOSES  Final diagnoses:  Torticollis, acute      ED Discharge Orders        Ordered    HYDROcodone-acetaminophen (NORCO/VICODIN) 5-325 MG tablet  Every 6 hours PRN     04/02/18 1227    methocarbamol (ROBAXIN) 500 MG tablet  Every 6 hours PRN     04/02/18 1227    ibuprofen (ADVIL,MOTRIN) 600 MG tablet  Every 8 hours PRN     04/02/18 1227       Note:  This document was prepared using Dragon voice recognition software and may include unintentional dictation errors.    Johnn Hai, PA-C 04/02/18 1501    Delman Kitten, MD 04/02/18 1705

## 2018-04-02 NOTE — ED Notes (Signed)
See triage note  Presents with pain to right side of neck and into right shoulder area for the past 3 days  States she had a similar episode with the left   Was placed on muscle relaxer at that time  She took one last pm and now pain is worse  Limit movement of arm d/t increased pain

## 2018-04-02 NOTE — ED Triage Notes (Signed)
Pt reports neck spasms x3 days, reports taking a muscle relaxer last night and pain is worse, now in right shoulder too with movement.

## 2018-05-22 ENCOUNTER — Emergency Department
Admission: EM | Admit: 2018-05-22 | Discharge: 2018-05-22 | Disposition: A | Discharge status: Home / Self Care | Payer: BLUE CROSS/BLUE SHIELD | Attending: Emergency Medicine | Admitting: Emergency Medicine

## 2018-05-22 ENCOUNTER — Emergency Department: Payer: BLUE CROSS/BLUE SHIELD

## 2018-05-22 ENCOUNTER — Other Ambulatory Visit: Payer: Self-pay

## 2018-05-22 DIAGNOSIS — Z79899 Other long term (current) drug therapy: Secondary | ICD-10-CM | POA: Insufficient documentation

## 2018-05-22 DIAGNOSIS — R0789 Other chest pain: Secondary | ICD-10-CM | POA: Insufficient documentation

## 2018-05-22 DIAGNOSIS — I10 Essential (primary) hypertension: Secondary | ICD-10-CM | POA: Insufficient documentation

## 2018-05-22 LAB — CBC
HEMATOCRIT: 37.6 % (ref 35.0–47.0)
Hemoglobin: 12.9 g/dL (ref 12.0–16.0)
MCH: 30.9 pg (ref 26.0–34.0)
MCHC: 34.2 g/dL (ref 32.0–36.0)
MCV: 90.3 fL (ref 80.0–100.0)
Platelets: 215 10*3/uL (ref 150–440)
RBC: 4.16 MIL/uL (ref 3.80–5.20)
RDW: 14.7 % — AB (ref 11.5–14.5)
WBC: 4.8 10*3/uL (ref 3.6–11.0)

## 2018-05-22 LAB — BASIC METABOLIC PANEL
Anion gap: 10 (ref 5–15)
BUN: 23 mg/dL — AB (ref 6–20)
CHLORIDE: 102 mmol/L (ref 101–111)
CO2: 23 mmol/L (ref 22–32)
CREATININE: 1.24 mg/dL — AB (ref 0.44–1.00)
Calcium: 9.4 mg/dL (ref 8.9–10.3)
GFR calc Af Amer: >60 mL/min (ref >60)
GFR calc non Af Amer: 55 mL/min — ABNORMAL LOW (ref >60)
Glucose, Bld: 89 mg/dL (ref 65–99)
POTASSIUM: 3.3 mmol/L — AB (ref 3.5–5.1)
SODIUM: 135 mmol/L (ref 135–145)

## 2018-05-22 LAB — POCT PREGNANCY, URINE: Preg Test, Ur: NEGATIVE

## 2018-05-22 LAB — TROPONIN I: Troponin I: <0.03 ng/mL (ref <0.03)

## 2018-05-22 MED ORDER — GI COCKTAIL ~~LOC~~
30.0000 mL | Freq: Once | ORAL | Status: AC
  Administered 2018-05-22: 30 mL via ORAL
  Filled 2018-05-22: qty 30

## 2018-05-22 MED ORDER — KETOROLAC TROMETHAMINE 30 MG/ML IJ SOLN
15.0000 mg | Freq: Once | INTRAMUSCULAR | Status: AC
  Administered 2018-05-22: 15 mg via INTRAVENOUS
  Filled 2018-05-22: qty 1

## 2018-05-22 MED ORDER — IOPAMIDOL (ISOVUE-370) INJECTION 76%
75.0000 mL | Freq: Once | INTRAVENOUS | Status: AC | PRN
  Administered 2018-05-22: 75 mL via INTRAVENOUS

## 2018-05-22 NOTE — ED Triage Notes (Signed)
FIRST NURSE NOTE-here for CP/back pain and pain in left arm.  Pulled for EKG.  ambulatory without distress. Tried tums without relief.

## 2018-05-22 NOTE — Discharge Instructions (Signed)
It was a pleasure to take care of you today, and thank you for coming to our emergency department.  If you have any questions or concerns before leaving please ask the nurse to grab me and I'm more than happy to go through your aftercare instructions again.  If you were prescribed any opioid pain medication today such as Norco, Vicodin, Percocet, morphine, hydrocodone, or oxycodone please make sure you do not drive when you are taking this medication as it can alter your ability to drive safely.  If you have any concerns once you are home that you are not improving or are in fact getting worse before you can make it to your follow-up appointment, please do not hesitate to call 911 and come back for further evaluation.  Gabriela Hong, MD  Results for orders placed or performed during the hospital encounter of 94/17/40  Basic metabolic panel  Result Value Ref Range   Sodium 135 135 - 145 mmol/L   Potassium 3.3 (L) 3.5 - 5.1 mmol/L   Chloride 102 101 - 111 mmol/L   CO2 23 22 - 32 mmol/L   Glucose, Bld 89 65 - 99 mg/dL   BUN 23 (H) 6 - 20 mg/dL   Creatinine, Ser 1.24 (H) 0.44 - 1.00 mg/dL   Calcium 9.4 8.9 - 10.3 mg/dL   GFR calc non Af Amer 55 (L) >60 mL/min   GFR calc Af Amer >60 >60 mL/min   Anion gap 10 5 - 15  CBC  Result Value Ref Range   WBC 4.8 3.6 - 11.0 K/uL   RBC 4.16 3.80 - 5.20 MIL/uL   Hemoglobin 12.9 12.0 - 16.0 g/dL   HCT 37.6 35.0 - 47.0 %   MCV 90.3 80.0 - 100.0 fL   MCH 30.9 26.0 - 34.0 pg   MCHC 34.2 32.0 - 36.0 g/dL   RDW 14.7 (H) 11.5 - 14.5 %   Platelets 215 150 - 440 K/uL  Troponin I  Result Value Ref Range   Troponin I <0.03 <0.03 ng/mL  Pregnancy, urine POC  Result Value Ref Range   Preg Test, Ur NEGATIVE NEGATIVE   Dg Chest 2 View  Result Date: 05/22/2018 CLINICAL DATA:  Acute onset of generalized chest pain. EXAM: CHEST - 2 VIEW COMPARISON:  None. FINDINGS: The lungs are well-aerated and clear. There is no evidence of focal opacification, pleural  effusion or pneumothorax. The heart is normal in size; the mediastinal contour is within normal limits. No acute osseous abnormalities are seen. IMPRESSION: No acute cardiopulmonary process seen. Electronically Signed   By: Garald Balding M.D.   On: 05/22/2018 19:05   Ct Angio Chest Pe W/cm &/or Wo Cm  Result Date: 05/22/2018 CLINICAL DATA:  Acute onset of generalized chest pain and left arm pain. EXAM: CT ANGIOGRAPHY CHEST WITH CONTRAST TECHNIQUE: Multidetector CT imaging of the chest was performed using the standard protocol during bolus administration of intravenous contrast. Multiplanar CT image reconstructions and MIPs were obtained to evaluate the vascular anatomy. CONTRAST:  78mL ISOVUE-370 IOPAMIDOL (ISOVUE-370) INJECTION 76% COMPARISON:  Chest radiograph performed earlier today at 6:53 p.m. FINDINGS: Cardiovascular:  There is no evidence of pulmonary embolus. The heart is normal in size. The thoracic aorta is unremarkable. The great vessels are within normal limits. Mediastinum/Nodes: The mediastinum is unremarkable in appearance. No mediastinal lymphadenopathy is seen. No pericardial effusion is identified. The visualized portions of the thyroid gland are unremarkable. No axillary lymphadenopathy is seen. Lungs/Pleura: The lungs are clear bilaterally. No  focal consolidation, pleural effusion or pneumothorax is seen. No masses are identified. Upper Abdomen: The visualized portions of the liver and spleen are unremarkable. The visualized portions of the gallbladder, pancreas, adrenal glands and kidneys are within normal limits. Musculoskeletal: No acute osseous abnormalities are identified. The visualized musculature is unremarkable in appearance. Review of the MIP images confirms the above findings. IMPRESSION: 1. No evidence of pulmonary embolus. 2. Lungs clear bilaterally. Electronically Signed   By: Garald Balding M.D.   On: 05/22/2018 20:43

## 2018-05-22 NOTE — ED Provider Notes (Signed)
[facility] Emergency Department Provider Note  ____________________________________________   First MD Initiated Contact with Patient 05/22/18 1909     (approximate)  I have reviewed the triage vital signs and the nursing notes.   HISTORY  Chief Complaint Chest Pain   HPI Gabriela Thomas is a 37 y.o. female who self presents to the emergency department with atypical chest pain.  The pain is in her right upper chest.  It is described as "heaviness".  It is nonradiating.  Nonexertional.  Positive for shortness of breath.  She does have pain in her right upper back.  No history of DVT or pulmonary embolism.  She does take birth control.  No cough.  No hemoptysis.  No recent surgery travel or immobilization.  The pain is not ripping or tearing.  She has no recent illness.  The pain is non-positional.  Nothing particular seems to make it better or worse.  Past Medical History:  Diagnosis Date  . Fibroid   . Hypertension     There are no active problems to display for this patient.   History reviewed. No pertinent surgical history.  Prior to Admission medications   Medication Sig Start Date End Date Taking? Authorizing Provider  amLODipine (NORVASC) 5 MG tablet Take by mouth.    [provider]  HYDROcodone-acetaminophen (NORCO/VICODIN) 5-325 MG tablet Take 1 tablet by mouth every 6 (six) hours as needed for moderate pain. 04/02/18   Johnn Hai, PA-C  ibuprofen (ADVIL,MOTRIN) 600 MG tablet Take 1 tablet (600 mg total) by mouth every 8 (eight) hours as needed. 04/02/18   Johnn Hai, PA-C  leuprolide (LUPRON DEPOT, 25-MONTH,) 3.75 MG injection  05/30/17   [provider]  losartan (COZAAR) 50 MG tablet Take by mouth. 09/09/17 09/09/18  [provider]  methocarbamol (ROBAXIN) 500 MG tablet Take 1 tablet (500 mg total) by mouth every 6 (six) hours as needed for muscle spasms. 04/02/18   Johnn Hai, PA-C    norethindrone-ethinyl estradiol (JUNEL FE 1/20) 1-20 MG-MCG tablet Take 1 tablet by mouth daily. 11/15/17   Rubie Maid, MD    Allergies Patient has no known allergies.  Family History  Problem Relation Age of Onset  . Hypertension Mother   . Diabetes Mother   . Congestive Heart Failure Mother   . Hypertension Maternal Aunt   . Hypertension Maternal Uncle   . Hypertension Maternal Grandmother   . Hypertension Maternal Grandfather     Social History Social History   Tobacco Use  . Smoking status: Never Smoker  . Smokeless tobacco: Never Used  Substance Use Topics  . Alcohol use: No    Comment: sometimes  . Drug use: No    Review of Systems Constitutional: No fever/chills Eyes: No visual changes. ENT: No sore throat. Cardiovascular: Positive for chest pain. Respiratory: Positive for shortness of breath. Gastrointestinal: No abdominal pain.  No nausea, no vomiting.  No diarrhea.  No constipation. Genitourinary: Negative for dysuria. Musculoskeletal: Negative for back pain. Skin: Negative for rash. Neurological: Negative for headaches, focal weakness or numbness.   ____________________________________________   PHYSICAL EXAM:  VITAL SIGNS: ED Triage Vitals  Enc Vitals Group     BP 05/22/18 1836 (!) 165/114     Pulse Rate 05/22/18 1836 89     Resp 05/22/18 1836 20     Temp 05/22/18 1836 98.7 F (37.1 C)     Temp Source 05/22/18 1836 Oral     SpO2 05/22/18 1836  99 %     Weight 05/22/18 1839 170 lb (77.1 kg)     Height 05/22/18 1839 5\' 5"  (1.651 m)     Head Circumference --      Peak Flow --      Pain Score --      Pain Loc --      Pain Edu? --      Excl. in [place]? --     Constitutional: Alert and oriented x4 somewhat anxious appearing nontoxic no diaphoresis speaks in full clear sentences Eyes: PERRL EOMI. Head: Atraumatic. Nose: No congestion/rhinnorhea. Mouth/Throat: No trismus Neck: No stridor.   Cardiovascular: Normal rate, regular rhythm.  Grossly normal heart sounds.  Good peripheral circulation. Respiratory: Normal respiratory effort.  No retractions. Lungs CTAB and moving good air Gastrointestinal: Soft nontender Musculoskeletal: No lower extremity edema legs are equal in size Neurologic:  Normal speech and language. No gross focal neurologic deficits are appreciated. Skin:  Skin is warm, dry and intact. No rash noted. Psychiatric: Mood and affect are normal. Speech and behavior are normal.    ____________________________________________   DIFFERENTIAL includes but not limited to  Pulmonary embolism, pneumothorax, Boerhaave syndrome, acute coronary syndrome ____________________________________________   LABS (all labs ordered are listed, but only abnormal results are displayed)  Labs Reviewed  BASIC METABOLIC PANEL - Abnormal; Notable for the following components:      Result Value   Potassium 3.3 (*)    BUN 23 (*)    Creatinine, Ser 1.24 (*)    GFR calc non Af Amer 55 (*)    All other components within normal limits  CBC - Abnormal; Notable for the following components:   RDW 14.7 (*)    All other components within normal limits  TROPONIN I  POC URINE PREG, ED  POCT PREGNANCY, URINE    Lab work reviewed by me with no acute disease __________________________________________  EKG  ED ECG REPORT I, Darel Hong, the attending physician, personally viewed and interpreted this ECG.  Date: 05/22/2018 EKG Time:  Rate: 99 Rhythm: normal sinus rhythm QRS Axis: normal Intervals: normal ST/T Wave abnormalities: normal Narrative Interpretation: no evidence of acute ischemia  ____________________________________________  RADIOLOGY  Chest x-ray reviewed by me with no acute disease CT angiogram reviewed by me with no acute disease ____________________________________________   PROCEDURES  Procedure(s) performed: no  Procedures  Critical Care performed: no  Observation:  no ____________________________________________   INITIAL IMPRESSION / ASSESSMENT AND PLAN / ED COURSE  Pertinent labs & imaging results that were available during my care of the patient were reviewed by me and considered in my medical decision making (see chart for details).  The patient arrives with atypical chest pain and a nonischemic EKG.  She does have some shortness of breath raising concern for pulmonary embolism.  She is on oral contraceptives.  CT angiogram fortunately is negative.  The patient feels significant relief and nearly complete resolution of her symptoms after GI cocktail.  I will refer her back to primary care with strict return precautions.  The patient herself verbalizes understanding and agreement the plan.      ____________________________________________   FINAL CLINICAL IMPRESSION(S) / ED DIAGNOSES  Final diagnoses:  Atypical chest pain      NEW MEDICATIONS STARTED DURING THIS VISIT:  Discharge Medication List as of 05/22/2018  9:01 PM       Note:  This document was prepared using Dragon voice recognition software and may include unintentional dictation errors.  Darel Hong, MD 05/22/18 2218

## 2018-05-22 NOTE — ED Triage Notes (Signed)
Pt c/o intermittent chest tightness since yesterday.

## 2018-07-09 ENCOUNTER — Encounter: Payer: Self-pay | Admitting: Emergency Medicine

## 2018-07-09 ENCOUNTER — Emergency Department
Admission: EM | Admit: 2018-07-09 | Discharge: 2018-07-09 | Disposition: A | Discharge status: Home / Self Care | Payer: BLUE CROSS/BLUE SHIELD | Attending: Emergency Medicine | Admitting: Emergency Medicine

## 2018-07-09 ENCOUNTER — Other Ambulatory Visit: Payer: Self-pay

## 2018-07-09 DIAGNOSIS — I10 Essential (primary) hypertension: Secondary | ICD-10-CM | POA: Insufficient documentation

## 2018-07-09 DIAGNOSIS — G4489 Other headache syndrome: Secondary | ICD-10-CM | POA: Diagnosis not present

## 2018-07-09 DIAGNOSIS — Z79899 Other long term (current) drug therapy: Secondary | ICD-10-CM | POA: Insufficient documentation

## 2018-07-09 DIAGNOSIS — R51 Headache: Secondary | ICD-10-CM | POA: Diagnosis present

## 2018-07-09 MED ORDER — BUTALBITAL-APAP-CAFFEINE 50-325-40 MG PO TABS
2.0000 | ORAL_TABLET | Freq: Once | ORAL | Status: AC
  Administered 2018-07-09: 2 via ORAL
  Filled 2018-07-09: qty 2

## 2018-07-09 MED ORDER — LOSARTAN POTASSIUM 50 MG PO TABS: 50.0000 mg | ORAL_TABLET | Freq: Every day | ORAL | 1 refills | Status: DC

## 2018-07-09 MED ORDER — CLONIDINE HCL 0.1 MG PO TABS
0.2000 mg | ORAL_TABLET | Freq: Once | ORAL | Status: AC
  Administered 2018-07-09: 0.2 mg via ORAL
  Filled 2018-07-09: qty 2

## 2018-07-09 MED ORDER — HYDROXYZINE HCL 25 MG PO TABS: 25.0000 mg | ORAL_TABLET | Freq: Three times a day (TID) | ORAL | 0 refills | Status: DC | PRN

## 2018-07-09 MED ORDER — BUTALBITAL-APAP-CAFFEINE 50-325-40 MG PO TABS: 1.0000 | ORAL_TABLET | Freq: Four times a day (QID) | ORAL | 0 refills | Status: AC | PRN

## 2018-07-09 MED ORDER — ONDANSETRON 4 MG PO TBDP
4.0000 mg | ORAL_TABLET | Freq: Once | ORAL | Status: AC
  Administered 2018-07-09: 4 mg via ORAL
  Filled 2018-07-09: qty 1

## 2018-07-09 MED ORDER — OXYCODONE HCL 5 MG PO TABS
5.0000 mg | ORAL_TABLET | Freq: Once | ORAL | Status: AC
  Administered 2018-07-09: 5 mg via ORAL
  Filled 2018-07-09: qty 1

## 2018-07-09 MED ORDER — DIPHENHYDRAMINE HCL 25 MG PO CAPS
25.0000 mg | ORAL_CAPSULE | Freq: Once | ORAL | Status: AC
  Administered 2018-07-09: 25 mg via ORAL
  Filled 2018-07-09: qty 1

## 2018-07-09 MED ORDER — METOCLOPRAMIDE HCL 10 MG PO TABS
10.0000 mg | ORAL_TABLET | Freq: Once | ORAL | Status: AC
  Administered 2018-07-09: 10 mg via ORAL
  Filled 2018-07-09: qty 1

## 2018-07-09 NOTE — ED Triage Notes (Signed)
Pt arrives ambulatory to triage with c/o HA x 2 weeks. Pt reports being given a prescription by the Dr on call at [facility] Pt reports hx of migraines. Pt is in NAD.

## 2018-07-10 NOTE — ED Provider Notes (Signed)
[facility] Emergency Department Provider Note ____________________________________________  Time seen: Approximately 12:09 AM  I have reviewed the triage vital signs and the nursing notes.   HISTORY  Chief Complaint Headache   HPI Gabriela Thomas is a 37 y.o. female who presents to the emergency department for treatment and evaluation of headache that is been present for the past 3 days.   Location: Frontal Similar to previous headaches: Yes Duration: 3 days TIMING: Unspecified SEVERITY: 9/10 QUALITY: Throbbing CONTEXT: Increases with stressors MODIFYING FACTORS: None ASSOCIATED SYMPTOMS: None Past Medical History:  Diagnosis Date  . Fibroid   . Hypertension     There are no active problems to display for this patient.   History reviewed. No pertinent surgical history.  Prior to Admission medications   Medication Sig Start Date End Date Taking? Authorizing Provider  amLODipine (NORVASC) 5 MG tablet Take by mouth.    [provider]  butalbital-acetaminophen-caffeine (FIORICET, ESGIC) [phone] MG tablet Take 1 tablet by mouth every 6 (six) hours as needed for up to 5 days for headache. 07/09/18 07/14/18  Puneet Masoner, Johnette Abraham B, FNP  HYDROcodone-acetaminophen (NORCO/VICODIN) 5-325 MG tablet Take 1 tablet by mouth every 6 (six) hours as needed for moderate pain. 04/02/18   Johnn Hai, PA-C  hydrOXYzine (ATARAX/VISTARIL) 25 MG tablet Take 1 tablet (25 mg total) by mouth 3 (three) times daily as needed for anxiety. 07/09/18   Napolean Sia, Johnette Abraham B, FNP  ibuprofen (ADVIL,MOTRIN) 600 MG tablet Take 1 tablet (600 mg total) by mouth every 8 (eight) hours as needed. 04/02/18   Johnn Hai, PA-C  leuprolide (LUPRON DEPOT, 44-MONTH,) 3.75 MG injection  05/30/17   [provider]  losartan (COZAAR) 50 MG tablet Take 1 tablet (50 mg total) by mouth daily. 07/09/18 07/09/19  Jaye Saal, Dessa Phi, FNP  methocarbamol (ROBAXIN) 500 MG tablet Take 1 tablet (500  mg total) by mouth every 6 (six) hours as needed for muscle spasms. 04/02/18   Johnn Hai, PA-C  norethindrone-ethinyl estradiol (JUNEL FE 1/20) 1-20 MG-MCG tablet Take 1 tablet by mouth daily. 11/15/17   Rubie Maid, MD    Allergies Patient has no known allergies.  Family History  Problem Relation Age of Onset  . Hypertension Mother   . Diabetes Mother   . Congestive Heart Failure Mother   . Hypertension Maternal Aunt   . Hypertension Maternal Uncle   . Hypertension Maternal Grandmother   . Hypertension Maternal Grandfather     Social History Social History   Tobacco Use  . Smoking status: Never Smoker  . Smokeless tobacco: Never Used  Substance Use Topics  . Alcohol use: No    Comment: sometimes  . Drug use: No    Review of Systems Constitutional: No fever/chills or recent injury. Eyes: No visual changes. ENT: No sore throat. Respiratory: Denies shortness of breath. Gastrointestinal: No abdominal pain.  No nausea, no vomiting.  No diarrhea.  No constipation. Musculoskeletal: Negative for pain. Skin: Negative for rash. Neurological:Positive for headache, negative for focal weakness or numbness. No confusion or fainting. ___________________________________________   PHYSICAL EXAM:  VITAL SIGNS: ED Triage Vitals  Enc Vitals Group     BP 07/09/18 1858 (!) 177/121     Pulse Rate 07/09/18 1858 (!) 101     Resp 07/09/18 1858 18     Temp 07/09/18 1858 98.6 F (37 C)     Temp Source 07/09/18 1858 Oral     SpO2 07/09/18 1858 100 %  Weight 07/09/18 1858 170 lb (77.1 kg)     Height 07/09/18 1858 5\' 5"  (1.651 m)     Head Circumference --      Peak Flow --      Pain Score 07/09/18 1904 10     Pain Loc --      Pain Edu? --      Excl. in [place]? --     Constitutional: Alert and oriented. Well appearing and in no acute distress. Eyes: Conjunctivae are normal. PERRL. EOMI without expressed pain. No evidence of papilledema on limited exam. Head:  Atraumatic. Nose: No congestion/rhinnorhea. Mouth/Throat: Mucous membranes are moist.  Oropharynx non-erythematous. Neck: No stridor. Supple, no meningismus.  Cardiovascular: Normal rate, regular rhythm. Grossly normal heart sounds.  Good peripheral circulation. Respiratory: Normal respiratory effort.  No retractions. Lungs CTAB. Gastrointestinal: Soft and nontender. No distention.  Musculoskeletal: No lower extremity tenderness nor edema.  No joint effusions. Neurologic:  Normal speech and language. No gross focal neurologic deficits are appreciated. No gait instability. Cranial nerves: 2-10 normal as tested. Cerebellar:Normal Romberg, finger-nose-finger, heel to shin, normal gait. Sensorimotor: No aphasia, pronator drift, clonus, sensory loss or abnormal reflexes.  Skin:  Skin is warm, dry and intact. No rash noted. Psychiatric: Mood and affect are normal. Speech and behavior are normal. Normal thought process and cognition.  ____________________________________________   LABS (all labs ordered are listed, but only abnormal results are displayed)  Labs Reviewed - No data to display ____________________________________________  EKG  Not indicated. ____________________________________________  RADIOLOGY  No results found. ____________________________________________   PROCEDURES  Procedure(s) performed:  Procedures  Critical Care performed: None ____________________________________________   INITIAL IMPRESSION / ASSESSMENT AND PLAN / ED COURSE  37 year old female presenting to the emergency department for treatment and evaluation of headache.  She has not been on any antihypertensives in several months as she ran out and has not scheduled an appointment to get any refills.  The patient states that she is living in a very stressful situation at the moment and feels that this contributes to both her hypertension as well as her headache.  She has taken some BC powder for the  headache without much relief.  While in the ER tonight, she was given 0.2 mg of Catapres with slight reduction of headache she was given Roxicet as well with little to no improvement.  Patient requests to be discharged home with a refill of her losartan and something to help with anxiety related to her living situation.  Patient was strongly advised to follow-up with her primary care provider to monitor and manage her anti-hypertensive medications as well as follow-up on anxiety and headache.  She was advised to return to the emergency department for symptoms of change or worsen if she is unable to schedule appointment.  Pertinent labs & imaging results that were available during my care of the patient were reviewed by me and considered in my medical decision making (see chart for details). ____________________________________________   FINAL CLINICAL IMPRESSION(S) / ED DIAGNOSES  Final diagnoses:  Essential hypertension  Other headache syndrome    ED Discharge Orders        Ordered    losartan (COZAAR) 50 MG tablet  Daily     07/09/18 2259    butalbital-acetaminophen-caffeine (FIORICET, ESGIC) 50-325-40 MG tablet  Every 6 hours PRN     07/09/18 2259    hydrOXYzine (ATARAX/VISTARIL) 25 MG tablet  3 times daily PRN     07/09/18 2304  Victorino Dike, FNP 07/10/18 Jen Mow    Delman Kitten, MD 07/10/18 [phone]

## 2018-08-10 ENCOUNTER — Other Ambulatory Visit: Payer: Self-pay | Admitting: Obstetrics and Gynecology

## 2018-08-15 NOTE — Telephone Encounter (Signed)
Pt called and informed that her Junel had been refilled but she needed to make an appointment to see if the medication is working well. Pt made an appointment today to come in 3 months for a follow up from birth control.

## 2018-11-15 ENCOUNTER — Encounter: Payer: Self-pay | Admitting: Obstetrics and Gynecology

## 2018-11-15 ENCOUNTER — Ambulatory Visit (INDEPENDENT_AMBULATORY_CARE_PROVIDER_SITE_OTHER): Payer: BLUE CROSS/BLUE SHIELD | Admitting: Obstetrics and Gynecology

## 2018-11-15 VITALS — BP 155/86 | HR 78 | Ht 64.0 in | Wt 171.4 lb

## 2018-11-15 DIAGNOSIS — D251 Intramural leiomyoma of uterus: Secondary | ICD-10-CM

## 2018-11-15 DIAGNOSIS — I10 Essential (primary) hypertension: Secondary | ICD-10-CM

## 2018-11-15 DIAGNOSIS — M545 Low back pain, unspecified: Secondary | ICD-10-CM

## 2018-11-15 DIAGNOSIS — N921 Excessive and frequent menstruation with irregular cycle: Secondary | ICD-10-CM | POA: Diagnosis not present

## 2018-11-15 MED ORDER — NORETHINDRONE ACET-ETHINYL EST 1.5-30 MG-MCG PO TABS: 1.0000 | ORAL_TABLET | Freq: Every day | ORAL | 11 refills | Status: DC

## 2018-11-15 NOTE — Progress Notes (Signed)
GYNECOLOGY PROGRESS NOTE  Subjective:    Patient ID: Gabriela Thomas, female    DOB: 16-Dec-1981, 37 y.o.   MRN: [id number]  HPI  Patient is a 37 y.o. [id number] female who presents for further discussion of management of her fibroids and to change her birth control pill. She notes that beginning in September she began noting bleeding during the last week of active pills.  She is also beginning to note more back pain, and feels as though they have gotten bigger. She states that she would now like to proceed with having her fibroids removed.   The following portions of the patient's history were reviewed and updated as appropriate: allergies, current medications, past family history, past medical history, past social history, past surgical history and problem list.  Review of Systems Pertinent items noted in HPI and remainder of comprehensive ROS otherwise negative.   Objective:   Blood pressure (!) 155/86, pulse 78, height 5\' 4"  (1.626 m), weight 171 lb 6.4 oz (77.7 kg), last menstrual period 11/14/2018. General appearance: alert and no distress Abdomen: normal findings: no organomegaly and soft, non-tender and abnormal findings:  mass arising from the pelvis, mobile, ~ 16-18 week size Pelvic: Pelvis deferred     Imaging:  ULTRASOUND REPORT  Location: [facility] Date of Service:  11/08/17   Indications: Fibroids Findings:  The uterus measures 13.4 x 8.7 x 8.5 cm. Echo texture is heterogeneous with evidence of focal masses. Within the uterus are multiple suspected fibroids measuring: Fibroid 1: Posterior LUS intramural measuring 3.6 x 3.3 x 3.4 cm. Fibroid 2: Fundal intramural measuring 7.5 x 7.7 x 7.0 cm. The Endometrium was unable to be delineated due to presence of fibroids.  Neither ovary was able to be visualized due to overlying bowel gas and presence of fibroids. Survey of the adnexa demonstrates no adnexal masses. There is no free fluid in the cul de  sac.  Impression: 1. Enlarged, anteverted uterus containing multiple fibroids.       Fibroid 1: 3.6 x 3.3 x 3.4 cm       Fibroid 2: 7.5 x 7.7 x 7.0 cm 2. Endometrium was unable to be delineated due to the presence of fibroids. 3. Bilateral ovaries were not visualized due to the presence of fibroids and bowel gas.  Recommendations: 1.Clinical correlation with the patient's History and Physical Exam.   Dario Ave, RDMS      Assessment:   Fibroid uterus Back pain Breakthrough bleeding on OCPs  Plan:   - Patient now notes that she is becoming more symptomatic with her fibroids, now desires surgical management with myomectomy. Discussed risks and benefits of procedure. Discussed procedure in detail, including hospital stay and recovery time of 4-6 weeks. Patient notes understanding. States that she is very nervous about having surgery, but thinks it is the right decision. Will return in 2 weeks for pre-op and further discussion of procedure.  Surgery to be scheduled 12/04/18.  She will need a repeat ultrasound as her last imaging was ~ 1 year ago.  - Continue to take OTC NSAIDs for back pain.  - Discussed contraception management with patient. Notes that she still desires to use combined OCPs, but thinks she may need to change to a different pill or higher dose.  Discussed that she has the option of changing pills now (as she is close to the end of her pill pack), or waiting until after her surgery Patient desires to change now. Will increase dosing, however  strongly encouraged patient to take her BP meds as prescribed as her HTN is currently uncontrolled.  - Hypertension. Currently uncontrolled. Not taking medications as prescribed by PCP. Meds were changed to Losartan-HCTZ.  Patient is currently not taking the Losartin and appears to be still taking her Norvasc.     A total of 15 minutes were spent face-to-face with the patient during this encounter and over half of that time dealt  with counseling and coordination of care.  Rubie Maid, MD [facility]

## 2018-11-15 NOTE — Patient Instructions (Signed)
Myomectomy Myomectomy is surgery to remove a noncancerous tumor (myoma) from the uterus. Myomas are tumors made up of fibrous tissue. They are often called fibroid tumors. Fibroid tumors can range from the size of a pea to the size of a grapefruit. In a myomectomy, the fibroid tumor is removed without removing the uterus. Because these tumors are rarely cancerous, this surgery is usually done only if the tumor is growing or causing symptoms such as pain, pressure, bleeding, or pain with intercourse. LET [facility] CARE PROVIDER KNOW ABOUT:  Any allergies you have.  All medicines you are taking, including vitamins, herbs, eye drops, creams, and over-the-counter medicines.  Previous problems you or members of your family have had with the use of anesthetics.  Any blood disorders you have.  Previous surgeries you have had.  Medical conditions you have. RISKS AND COMPLICATIONS Generally, this is a safe procedure. However, as with any procedure, complications can occur. Possible complications include:  Excessive bleeding.  Infection.  Injury to nearby organs.  Blood clots in the legs, chest, or brain.  Scar tissue on other organs and in the pelvis. This may require another surgery to remove the scar tissue.  BEFORE THE PROCEDURE  Ask your health care provider about changing or stopping your regular medicines. Avoid taking aspirin or blood thinners as directed by your health care provider.  Do not  eat or drink anything after midnight on the night before surgery.  If you smoke, do not  smoke for 2 weeks before the surgery.  Do not  drink alcohol the day before the surgery.  Arrange for someone to drive you home after the procedure or after your hospital stay. Also arrange for someone to help you with activities during your recovery. PROCEDURE You will be given medicine to make you sleep through the procedure (general anesthetic). Any of the following methods may be used to perform  a myomectomy:  Small monitors will be put on your body. They are used to check your heart, blood pressure, and oxygen level.  An IV access tube will be put into one of your veins. Medicine will be able to flow directly into your body through this IV tube.  You might be given a medicine to help you relax (sedative).  You will be given a medicine to make you sleep (general anesthetic). A breathing tube will be placed into your lungs during the procedure.  A thin, flexible tube (catheter) will be inserted into your bladder to collect urine.  Any of the following methods may be used to perform a myomectomy: ? Hysteroscopic myomectomy-This method may be used when the fibroid tumor is inside the cavity of the uterus. A long, thin tube that is like a telescope (hysteroscope) is inserted inside the uterus. A saline solution is put into your uterus. This expands the uterus and allows the surgeon to see the fibroids. Tools are passed through the hysteroscope to remove the fibroid tumor in pieces. ? Laparoscopic myomectomy-A few small cuts (incisions) are made in the lower abdomen. A thin, lighted tube with a tiny camera on the end (laparoscope) is inserted through one of the incisions. This gives the surgeon a good view of the area. The fibroid tumor is removed through the other incisions. The incisions are then closed with stitches (sutures) or staples. ? Abdominal myomectomy-This method is used when the fibroid tumor cannot be removed with a hysteroscope or laparoscope. The surgery is performed through a larger surgical incision in the abdomen. The  fibroid tumor is removed through this incision. The incision is closed with sutures or staples.  What to expect after the procedure  If you had a laparoscopic or hysteroscopic myomectomy, you may be able to go home the same day, or you may need to stay in the hospital overnight.  If you had an abdominal myomectomy, you may need to stay in the hospital for a  few days.  Your IV access tube and catheter will be removed in 1-2 days.  You may be given medicine for pain or to help you sleep.  You may be given an antibiotic medicine, if needed. This information is not intended to replace advice given to you by your health care provider. Make sure you discuss any questions you have with your health care provider. Document Released: 10/10/2007 Document Revised: 05/20/2016 Document Reviewed: 07/25/2013 Elsevier Interactive Patient Education  2017 [employer].

## 2018-11-15 NOTE — Progress Notes (Signed)
Pt stated that she wants to change her birth control pill and discuss having surgery to have her fibroids removed.

## 2018-11-18 ENCOUNTER — Encounter: Payer: Self-pay | Admitting: Obstetrics and Gynecology

## 2018-11-19 DIAGNOSIS — I10 Essential (primary) hypertension: Secondary | ICD-10-CM | POA: Insufficient documentation

## 2018-11-28 ENCOUNTER — Ambulatory Visit (INDEPENDENT_AMBULATORY_CARE_PROVIDER_SITE_OTHER): Payer: BLUE CROSS/BLUE SHIELD

## 2018-11-28 ENCOUNTER — Ambulatory Visit (INDEPENDENT_AMBULATORY_CARE_PROVIDER_SITE_OTHER): Payer: BLUE CROSS/BLUE SHIELD | Admitting: Obstetrics and Gynecology

## 2018-11-28 ENCOUNTER — Encounter: Payer: Self-pay | Admitting: Obstetrics and Gynecology

## 2018-11-28 VITALS — BP 160/100 | HR 84 | Ht 64.0 in | Wt 175.0 lb

## 2018-11-28 DIAGNOSIS — I10 Essential (primary) hypertension: Secondary | ICD-10-CM

## 2018-11-28 DIAGNOSIS — N921 Excessive and frequent menstruation with irregular cycle: Secondary | ICD-10-CM

## 2018-11-28 DIAGNOSIS — D259 Leiomyoma of uterus, unspecified: Secondary | ICD-10-CM | POA: Diagnosis not present

## 2018-11-28 DIAGNOSIS — Z01818 Encounter for other preprocedural examination: Secondary | ICD-10-CM

## 2018-11-28 DIAGNOSIS — D219 Benign neoplasm of connective and other soft tissue, unspecified: Secondary | ICD-10-CM

## 2018-11-28 NOTE — Progress Notes (Signed)
    GYNECOLOGY PROGRESS NOTE  Subjective:    Patient ID: Gabriela Thomas, female    DOB: [birth year], 37 y.o.   MRN: [id number]  HPI  Patient is a 37 y.o. G64P1001 female who presents for pre-operative examination for scheduled surgery of abdominal myomectomy for fibroid uterus, breakthrough bleeding on OCPs, back pain. Has prior history of Lupron use. She denies complaints today.   The following portions of the patient's history were reviewed and updated as appropriate: allergies, current medications, past family history, past medical history, past social history, past surgical history and problem list.  Review of Systems Pertinent items noted in HPI and remainder of comprehensive ROS otherwise negative.   Objective:   Blood pressure (!) 160/100, pulse 84, height 5\' 4"  (1.626 m), weight 175 lb (79.4 kg), last menstrual period 11/14/2018. General appearance: alert and no distress See previous note for details of exam.    Assessment:   Fibroid uterus Back pain Breakthrough bleeding on OCPs Hypertension  Plan:   - Patient desires surgical management with abdominal myomectomy.  The risks of surgery were discussed in detail with the patient including but not limited to: bleeding which may require transfusion or reoperation; infection which may require prolonged hospitalization or re-hospitalization and antibiotic therapy; injury to bowel, bladder, ureters and major vessels or other surrounding organs; need for additional procedures including hysterectomy; thromboembolic phenomenon, incisional problems and other postoperative or anesthesia complications.  Patient was told that the likelihood that her condition and symptoms will be treated effectively with this surgical management was very high; the postoperative expectations were also discussed in detail. The patient also understands the alternative treatment options which were discussed in full. All questions were answered.  She was told that  she will be contacted by our surgical scheduler regarding the time and date of her surgery; routine preoperative instructions of having nothing to eat or drink after midnight on the day prior to surgery and also coming to the hospital 1.5 hours prior to her time of surgery were also emphasized.  She has a hospital pre-operative appointment (phone interview) and will be given further preoperative instructions at that visit. Printed patient education handouts about the procedure were given to the patient to review at home. - Discussed use of cell-savers during surgery to help with anticipated surgical blood loss.  - Advised to increase dosing of her blood pressure medication from 1 to 2 tablets daily as it is currently still uncontrolled.  - Breakthrough bleeding on OCPs may improve once fibroids removed. Was also recently changed to a different birth control last visit.    Rubie Maid, MD [facility]

## 2018-11-28 NOTE — H&P (View-Only) (Signed)
GYNECOLOGY PREOPERATIVE HISTORY AND PHYSICAL   Subjective:  Gabriela Thomas is a 37 y.o. G1P1001 here for surgical management of uterine fibroids, breakthrough bleeding on OCPs, and low back pain.  No significant preoperative concerns.  Has been on trial of Lupron recently for ~ 6 months.   Proposed surgery: Abdominal myomectomy    Pertinent Gynecological History: Menses: flow is irregular, with breakthrough bleeding during 1st-2nd week of contraceptive pills. Flow is moderate. Contraception: OCP (estrogen/progesterone) Last pap: normal Date: 02/2017 (noted in Care Everywhere)   Past Medical History:  Diagnosis Date  . Fibroid   . Hypertension     History reviewed. No pertinent surgical history.    OB History  Gravida Para Term Preterm AB Living  1 1 1     1   SAB TAB Ectopic Multiple Live Births          1    # Outcome Date GA Lbr Len/2nd Weight Sex Delivery Anes PTL Lv  1 Term 1999 [redacted]w[redacted]d   F Vag-Spont   LIV    Family History  Problem Relation Age of Onset  . Hypertension Mother   . Diabetes Mother   . Congestive Heart Failure Mother   . Hypertension Maternal Aunt   . Hypertension Maternal Uncle   . Hypertension Maternal Grandmother   . Hypertension Maternal Grandfather    Social History   Socioeconomic History  . Marital status: Single    Spouse name: Not on file  . Number of children: Not on file  . Years of education: Not on file  . Highest education level: Not on file  Occupational History  . Not on file  Social Needs  . Financial resource strain: Not on file  . Food insecurity:    Worry: Not on file    Inability: Not on file  . Transportation needs:    Medical: Not on file    Non-medical: Not on file  Tobacco Use  . Smoking status: Never Smoker  . Smokeless tobacco: Never Used  Substance and Sexual Activity  . Alcohol use: Yes    Alcohol/week: 7.0 standard drinks    Types: 7 Glasses of wine per week    Comment: sometimes  . Drug use: No   . Sexual activity: Yes    Birth control/protection: Pill  Lifestyle  . Physical activity:    Days per week: Not on file    Minutes per session: Not on file  . Stress: Not on file  Relationships  . Social connections:    Talks on phone: Not on file    Gets together: Not on file    Attends religious service: Not on file    Active member of club or organization: Not on file    Attends meetings of clubs or organizations: Not on file    Relationship status: Not on file  . Intimate partner violence:    Fear of current or ex partner: Not on file    Emotionally abused: Not on file    Physically abused: Not on file    Forced sexual activity: Not on file  Other Topics Concern  . Not on file  Social History Narrative  . Not on file   Current Outpatient Medications on File Prior to Visit  Medication Sig Dispense Refill  . butalbital-acetaminophen-caffeine (FIORICET, ESGIC) 50-325-40 MG tablet Take 1 tablet by mouth 2 (two) times daily as needed for headache or migraine.     Marland Kitchen EVENING PRIMROSE OIL PO Take 1  capsule by mouth 3 (three) times daily.    Marland Kitchen FLUoxetine (PROZAC) 20 MG capsule Take 20 mg by mouth daily.     . hydrochlorothiazide (HYDRODIURIL) 12.5 MG tablet Take 12.5 mg by mouth daily.    Marland Kitchen ibuprofen (ADVIL,MOTRIN) 200 MG tablet Take 800 mg by mouth every 6 (six) hours as needed for headache or moderate pain.    Marland Kitchen norethindrone-ethinyl estradiol-iron (MICROGESTIN FE,GILDESS FE,LOESTRIN FE) 1.5-30 MG-MCG tablet Take 1 tablet by mouth daily.     No current facility-administered medications on file prior to visit.    No Known Allergies    Review of Systems Constitutional: No recent fever/chills/sweats Respiratory: No recent cough/bronchitis Cardiovascular: No chest pain Gastrointestinal: No recent nausea/vomiting/diarrhea Genitourinary: No UTI symptoms Hematologic/lymphatic:No history of coagulopathy or recent blood thinner use    Objective:   Blood pressure (!) 160/100,  pulse 84, height 5\' 4"  (1.626 m), weight 175 lb (79.4 kg), last menstrual period 11/14/2018. CONSTITUTIONAL: Well-developed, well-nourished female in no acute distress.  HENT:  Normocephalic, atraumatic, External right and left ear normal. Oropharynx is clear and moist EYES: Conjunctivae and EOM are normal. Pupils are equal, round, and reactive to light. No scleral icterus.  NECK: Normal range of motion, supple, no masses SKIN: Skin is warm and dry. No rash noted. Not diaphoretic. No erythema. No pallor. NEUROLOGIC: Alert and oriented to person, place, and time. Normal reflexes, muscle tone coordination. No cranial nerve deficit noted. PSYCHIATRIC: Normal mood and affect. Normal behavior. Normal judgment and thought content. CARDIOVASCULAR: Normal heart rate noted, regular rhythm RESPIRATORY: Effort and breath sounds normal, no problems with respiration noted ABDOMEN: Soft, mildly tender in lower abdomen. Mass arising from pelvis, mobile, ~ 16-18 week sized.  PELVIC: Deferred MUSCULOSKELETAL: Normal range of motion. No edema and no tenderness. 2+ distal pulses.    Labs: No results found for this or any previous visit (from the past 336 hour(s)).   Imaging Studies: No results found.  Assessment:    Preop examination Essential hypertension Fibroid Breakthrough bleeding on birth control pills   Plan:    Counseling: Procedure, risks, reasons, benefits and complications (including injury to bowel, bladder, major blood vessel, ureter, bleeding, possibility of transfusion, infection, or fistula formation) reviewed in detail. Likelihood of success in alleviating the patient's condition was discussed. Routine postoperative instructions will be reviewed with the patient and her family in detail after surgery.  The patient concurred with the proposed plan, giving informed written consent for the surgery.   Preop testing ordered. Instructions reviewed, including NPO after midnight. Continue  use of OCPs. Breakthrough bleeding may improve after hysterectomy. .  Advised to increase HCTZ to 2 tablets daily due to persistent elevated BP.     Rubie Maid, MD [facility]

## 2018-11-28 NOTE — Patient Instructions (Signed)
Myomectomy Myomectomy is surgery to remove a noncancerous tumor (myoma) from the uterus. Myomas are tumors made up of fibrous tissue. They are often called fibroid tumors. Fibroid tumors can range from the size of a pea to the size of a grapefruit. In a myomectomy, the fibroid tumor is removed without removing the uterus. Because these tumors are rarely cancerous, this surgery is usually done only if the tumor is growing or causing symptoms such as pain, pressure, bleeding, or pain with intercourse. LET [facility] CARE PROVIDER KNOW ABOUT:  Any allergies you have.  All medicines you are taking, including vitamins, herbs, eye drops, creams, and over-the-counter medicines.  Previous problems you or members of your family have had with the use of anesthetics.  Any blood disorders you have.  Previous surgeries you have had.  Medical conditions you have. RISKS AND COMPLICATIONS Generally, this is a safe procedure. However, as with any procedure, complications can occur. Possible complications include:  Excessive bleeding.  Infection.  Injury to nearby organs.  Blood clots in the legs, chest, or brain.  Scar tissue on other organs and in the pelvis. This may require another surgery to remove the scar tissue.  BEFORE THE PROCEDURE  Ask your health care provider about changing or stopping your regular medicines. Avoid taking aspirin or blood thinners as directed by your health care provider.  Do not  eat or drink anything after midnight on the night before surgery.  If you smoke, do not  smoke for 2 weeks before the surgery.  Do not  drink alcohol the day before the surgery.  Arrange for someone to drive you home after the procedure or after your hospital stay. Also arrange for someone to help you with activities during your recovery. PROCEDURE You will be given medicine to make you sleep through the procedure (general anesthetic). Any of the following methods may be used to perform  a myomectomy:  Small monitors will be put on your body. They are used to check your heart, blood pressure, and oxygen level.  An IV access tube will be put into one of your veins. Medicine will be able to flow directly into your body through this IV tube.  You might be given a medicine to help you relax (sedative).  You will be given a medicine to make you sleep (general anesthetic). A breathing tube will be placed into your lungs during the procedure.  A thin, flexible tube (catheter) will be inserted into your bladder to collect urine.  Any of the following methods may be used to perform a myomectomy: ? Hysteroscopic myomectomy-This method may be used when the fibroid tumor is inside the cavity of the uterus. A long, thin tube that is like a telescope (hysteroscope) is inserted inside the uterus. A saline solution is put into your uterus. This expands the uterus and allows the surgeon to see the fibroids. Tools are passed through the hysteroscope to remove the fibroid tumor in pieces. ? Laparoscopic myomectomy-A few small cuts (incisions) are made in the lower abdomen. A thin, lighted tube with a tiny camera on the end (laparoscope) is inserted through one of the incisions. This gives the surgeon a good view of the area. The fibroid tumor is removed through the other incisions. The incisions are then closed with stitches (sutures) or staples. ? Abdominal myomectomy-This method is used when the fibroid tumor cannot be removed with a hysteroscope or laparoscope. The surgery is performed through a larger surgical incision in the abdomen. The  fibroid tumor is removed through this incision. The incision is closed with sutures or staples.  What to expect after the procedure  If you had a laparoscopic or hysteroscopic myomectomy, you may be able to go home the same day, or you may need to stay in the hospital overnight.  If you had an abdominal myomectomy, you may need to stay in the hospital for a  few days.  Your IV access tube and catheter will be removed in 1-2 days.  You may be given medicine for pain or to help you sleep.  You may be given an antibiotic medicine, if needed. This information is not intended to replace advice given to you by your health care provider. Make sure you discuss any questions you have with your health care provider. Document Released: 10/10/2007 Document Revised: 05/20/2016 Document Reviewed: 07/25/2013 Elsevier Interactive Patient Education  2017 [employer].

## 2018-11-28 NOTE — Progress Notes (Signed)
Pt is present today for pre-op visit. Pt stated that she is doing well no complaints.

## 2018-11-28 NOTE — H&P (Signed)
GYNECOLOGY PREOPERATIVE HISTORY AND PHYSICAL   Subjective:  Gabriela Thomas is a 37 y.o. G1P1001 here for surgical management of uterine fibroids, breakthrough bleeding on OCPs, and low back pain.  No significant preoperative concerns.  Has been on trial of Lupron recently for ~ 6 months.   Proposed surgery: Abdominal myomectomy    Pertinent Gynecological History: Menses: Thomas is irregular, with breakthrough bleeding during 1st-2nd week of contraceptive pills. Thomas is moderate. Contraception: OCP (estrogen/progesterone) Last pap: normal Date: 02/2017 (noted in Care Everywhere)   Past Medical History:  Diagnosis Date  . Fibroid   . Hypertension     History reviewed. No pertinent surgical history.    OB History  Gravida Para Term Preterm AB Living  1 1 1     1   SAB TAB Ectopic Multiple Live Births          1    # Outcome Date GA Lbr Len/2nd Weight Sex Delivery Anes PTL Lv  1 Term 1999 [redacted]w[redacted]d   F Vag-Spont   LIV    Family History  Problem Relation Age of Onset  . Hypertension Mother   . Diabetes Mother   . Congestive Heart Failure Mother   . Hypertension Maternal Aunt   . Hypertension Maternal Uncle   . Hypertension Maternal Grandmother   . Hypertension Maternal Grandfather    Social History   Socioeconomic History  . Marital status: Single    Spouse name: Not on file  . Number of children: Not on file  . Years of education: Not on file  . Highest education level: Not on file  Occupational History  . Not on file  Social Needs  . Financial resource strain: Not on file  . Food insecurity:    Worry: Not on file    Inability: Not on file  . Transportation needs:    Medical: Not on file    Non-medical: Not on file  Tobacco Use  . Smoking status: Never Smoker  . Smokeless tobacco: Never Used  Substance and Sexual Activity  . Alcohol use: Yes    Alcohol/week: 7.0 standard drinks    Types: 7 Glasses of wine per week    Comment: sometimes  . Drug use: No   . Sexual activity: Yes    Birth control/protection: Pill  Lifestyle  . Physical activity:    Days per week: Not on file    Minutes per session: Not on file  . Stress: Not on file  Relationships  . Social connections:    Talks on phone: Not on file    Gets together: Not on file    Attends religious service: Not on file    Active member of club or organization: Not on file    Attends meetings of clubs or organizations: Not on file    Relationship status: Not on file  . Intimate partner violence:    Fear of current or ex partner: Not on file    Emotionally abused: Not on file    Physically abused: Not on file    Forced sexual activity: Not on file  Other Topics Concern  . Not on file  Social History Narrative  . Not on file   Current Outpatient Medications on File Prior to Visit  Medication Sig Dispense Refill  . butalbital-acetaminophen-caffeine (FIORICET, ESGIC) 50-325-40 MG tablet Take 1 tablet by mouth 2 (two) times daily as needed for headache or migraine.     Marland Kitchen EVENING PRIMROSE OIL PO Take 1  capsule by mouth 3 (three) times daily.    Marland Kitchen FLUoxetine (PROZAC) 20 MG capsule Take 20 mg by mouth daily.     . hydrochlorothiazide (HYDRODIURIL) 12.5 MG tablet Take 12.5 mg by mouth daily.    Marland Kitchen ibuprofen (ADVIL,MOTRIN) 200 MG tablet Take 800 mg by mouth every 6 (six) hours as needed for headache or moderate pain.    Marland Kitchen norethindrone-ethinyl estradiol-iron (MICROGESTIN FE,GILDESS FE,LOESTRIN FE) 1.5-30 MG-MCG tablet Take 1 tablet by mouth daily.     No current facility-administered medications on file prior to visit.    No Known Allergies    Review of Systems Constitutional: No recent fever/chills/sweats Respiratory: No recent cough/bronchitis Cardiovascular: No chest pain Gastrointestinal: No recent nausea/vomiting/diarrhea Genitourinary: No UTI symptoms Hematologic/lymphatic:No history of coagulopathy or recent blood thinner use    Objective:   Blood pressure (!) 160/100,  pulse 84, height 5\' 4"  (1.626 m), weight 175 lb (79.4 kg), last menstrual period 11/14/2018. CONSTITUTIONAL: Well-developed, well-nourished female in no acute distress.  HENT:  Normocephalic, atraumatic, External right and left ear normal. Oropharynx is clear and moist EYES: Conjunctivae and EOM are normal. Pupils are equal, round, and reactive to light. No scleral icterus.  NECK: Normal range of motion, supple, no masses SKIN: Skin is warm and dry. No rash noted. Not diaphoretic. No erythema. No pallor. NEUROLOGIC: Alert and oriented to person, place, and time. Normal reflexes, muscle tone coordination. No cranial nerve deficit noted. PSYCHIATRIC: Normal mood and affect. Normal behavior. Normal judgment and thought content. CARDIOVASCULAR: Normal heart rate noted, regular rhythm RESPIRATORY: Effort and breath sounds normal, no problems with respiration noted ABDOMEN: Soft, mildly tender in lower abdomen. Mass arising from pelvis, mobile, ~ 16-18 week sized.  PELVIC: Deferred MUSCULOSKELETAL: Normal range of motion. No edema and no tenderness. 2+ distal pulses.    Labs: No results found for this or any previous visit (from the past 336 hour(s)).   Imaging Studies: No results found.  Assessment:    Preop examination Essential hypertension Fibroid Breakthrough bleeding on birth control pills   Plan:    Counseling: Procedure, risks, reasons, benefits and complications (including injury to bowel, bladder, major blood vessel, ureter, bleeding, possibility of transfusion, infection, or fistula formation) reviewed in detail. Likelihood of success in alleviating the patient's condition was discussed. Routine postoperative instructions will be reviewed with the patient and her family in detail after surgery.  The patient concurred with the proposed plan, giving informed written consent for the surgery.   Preop testing ordered. Instructions reviewed, including NPO after midnight. Continue  use of OCPs. Breakthrough bleeding may improve after hysterectomy. .  Advised to increase HCTZ to 2 tablets daily due to persistent elevated BP.     Rubie Maid, MD [facility]

## 2018-11-29 ENCOUNTER — Emergency Department
Admission: EM | Admit: 2018-11-29 | Discharge: 2018-11-29 | Disposition: A | Discharge status: Home / Self Care | Payer: BLUE CROSS/BLUE SHIELD | Attending: Emergency Medicine | Admitting: Emergency Medicine

## 2018-11-29 ENCOUNTER — Other Ambulatory Visit: Payer: Self-pay

## 2018-11-29 DIAGNOSIS — I10 Essential (primary) hypertension: Secondary | ICD-10-CM | POA: Diagnosis present

## 2018-11-29 DIAGNOSIS — Z79899 Other long term (current) drug therapy: Secondary | ICD-10-CM | POA: Diagnosis not present

## 2018-11-29 DIAGNOSIS — R51 Headache: Secondary | ICD-10-CM | POA: Insufficient documentation

## 2018-11-29 LAB — COMPREHENSIVE METABOLIC PANEL
ALT: 17 U/L (ref 0–44)
AST: 25 U/L (ref 15–41)
Albumin: 3.9 g/dL (ref 3.5–5.0)
Alkaline Phosphatase: 47 U/L (ref 38–126)
Anion gap: 9 (ref 5–15)
BUN: 16 mg/dL (ref 6–20)
CO2: 27 mmol/L (ref 22–32)
Calcium: 9 mg/dL (ref 8.9–10.3)
Chloride: 101 mmol/L (ref 98–111)
Creatinine, Ser: 0.99 mg/dL (ref 0.44–1.00)
Glucose, Bld: 131 mg/dL — ABNORMAL HIGH (ref 70–99)
Potassium: 3.4 mmol/L — ABNORMAL LOW (ref 3.5–5.1)
Sodium: 137 mmol/L (ref 135–145)
Total Bilirubin: 0.3 mg/dL (ref 0.3–1.2)
Total Protein: 7.7 g/dL (ref 6.5–8.1)

## 2018-11-29 LAB — CBC
HCT: 39 % (ref 36.0–46.0)
Hemoglobin: 13.1 g/dL (ref 12.0–15.0)
MCH: 30.8 pg (ref 26.0–34.0)
MCHC: 33.6 g/dL (ref 30.0–36.0)
MCV: 91.5 fL (ref 80.0–100.0)
PLATELETS: 209 10*3/uL (ref 150–400)
RBC: 4.26 MIL/uL (ref 3.87–5.11)
RDW: 12.4 % (ref 11.5–15.5)
WBC: 6.3 10*3/uL (ref 4.0–10.5)
nRBC: 0 % (ref 0.0–0.2)

## 2018-11-29 LAB — TROPONIN I

## 2018-11-29 MED ORDER — KETOROLAC TROMETHAMINE 30 MG/ML IJ SOLN
30.0000 mg | Freq: Once | INTRAMUSCULAR | Status: AC
  Administered 2018-11-29: 30 mg via INTRAVENOUS
  Filled 2018-11-29: qty 1

## 2018-11-29 MED ORDER — METOCLOPRAMIDE HCL 5 MG/ML IJ SOLN
10.0000 mg | Freq: Once | INTRAMUSCULAR | Status: AC
  Administered 2018-11-29: 10 mg via INTRAVENOUS
  Filled 2018-11-29: qty 2

## 2018-11-29 MED ORDER — DIPHENHYDRAMINE HCL 50 MG/ML IJ SOLN
50.0000 mg | Freq: Once | INTRAMUSCULAR | Status: AC
  Administered 2018-11-29: 50 mg via INTRAVENOUS
  Filled 2018-11-29: qty 1

## 2018-11-29 MED ORDER — CLONIDINE HCL 0.1 MG PO TABS
0.1000 mg | ORAL_TABLET | Freq: Once | ORAL | Status: AC
  Administered 2018-11-29: 0.1 mg via ORAL
  Filled 2018-11-29: qty 1

## 2018-11-29 NOTE — ED Triage Notes (Signed)
Pt in with co hypertension states saw pmd and had bp med added, but was not able to fill it. Pt here for persistent headache, Pt also co dizziness when she stands.

## 2018-11-29 NOTE — ED Notes (Signed)
Pt d/c'd during computer downtime by Caryl Pina, Therapist, sports. Paper form signed for scanning into EMR.

## 2018-11-29 NOTE — ED Provider Notes (Signed)
[facility] Emergency Department Provider Note  Time seen: 9:31 PM  I have reviewed the triage vital signs and the nursing notes.   HISTORY  Chief Complaint Hypertension    HPI Gabriela Thomas is a 37 y.o. female with a past medical history of hypertension, fibroids, presents the emergency department for elevated blood pressure and headache.  According to the patient for the past several days she has had elevated blood pressure, states her doctor recently increased her blood pressure medication and prescribed a new medication although the patient has not yet picked up this medication.  Patient is concerned about her blood pressure because she has a surgery scheduled this coming Monday for uterine fibroids however was told if she cannot get her blood pressure controlled in time that they will not be able to do the surgery.  Patient states she checked it today at the pharmacy at [facility] and it was 180/117 so the patient came to the emergency department for evaluation.  Patient denies any chest pain.  Does state a headache worse with looking at light, history of migraines in the past.  Took her home migraine medication without relief.   Past Medical History:  Diagnosis Date  . Fibroid   . Hypertension     Patient Active Problem List   Diagnosis Date Noted  . Essential hypertension 11/19/2018  . Intramural leiomyoma of uterus 11/15/2018    No past surgical history on file.  Prior to Admission medications   Medication Sig Start Date End Date Taking? Authorizing Provider  butalbital-acetaminophen-caffeine (FIORICET, ESGIC) 50-325-40 MG tablet Take 1 tablet by mouth 2 (two) times daily as needed for headache or migraine.     [provider]  EVENING PRIMROSE OIL PO Take 1 capsule by mouth 3 (three) times daily.    [provider]  FLUoxetine (PROZAC) 20 MG capsule Take 20 mg by mouth daily.     [provider]  hydrochlorothiazide  (HYDRODIURIL) 12.5 MG tablet Take 12.5 mg by mouth daily.    [provider]  ibuprofen (ADVIL,MOTRIN) 200 MG tablet Take 800 mg by mouth every 6 (six) hours as needed for headache or moderate pain.    [provider]  norethindrone-ethinyl estradiol-iron (MICROGESTIN FE,GILDESS FE,LOESTRIN FE) 1.5-30 MG-MCG tablet Take 1 tablet by mouth daily.    [provider]    No Known Allergies  Family History  Problem Relation Age of Onset  . Hypertension Mother   . Diabetes Mother   . Congestive Heart Failure Mother   . Hypertension Maternal Aunt   . Hypertension Maternal Uncle   . Hypertension Maternal Grandmother   . Hypertension Maternal Grandfather     Social History Social History   Tobacco Use  . Smoking status: Never Smoker  . Smokeless tobacco: Never Used  Substance Use Topics  . Alcohol use: Yes    Alcohol/week: 7.0 standard drinks    Types: 7 Glasses of wine per week    Comment: sometimes  . Drug use: No    Review of Systems Constitutional: Negative for fever. Cardiovascular: Negative for chest pain. Respiratory: Negative for shortness of breath. Gastrointestinal: Negative for abdominal pain, vomiting  Genitourinary: Negative for urinary compaints Musculoskeletal: Negative for musculoskeletal complaints Skin: Negative for skin complaints  Neurological: Moderate headache.  Denies weakness or numbness. All other ROS negative  ____________________________________________   PHYSICAL EXAM:  VITAL SIGNS: ED Triage Vitals  Enc Vitals Group     BP 11/29/18 1906 (!) 188/117  Pulse Rate 11/29/18 1906 93     Resp 11/29/18 1906 20     Temp 11/29/18 1906 98.4 F (36.9 C)     Temp Source 11/29/18 1906 Oral     SpO2 11/29/18 1906 99 %     Weight 11/29/18 1907 177 lb (80.3 kg)     Height 11/29/18 1907 5\' 4"  (1.626 m)     Head Circumference --      Peak Flow --      Pain Score 11/29/18 1906 8     Pain Loc --      Pain Edu? --      Excl.  in [place]? --    Constitutional: Alert and oriented. Well appearing and in no distress. Eyes: Normal exam ENT   Head: Normocephalic and atraumatic.   Mouth/Throat: Mucous membranes are moist. Cardiovascular: Normal rate, regular rhythm. No murmur Respiratory: Normal respiratory effort without tachypnea nor retractions. Breath sounds are clear  Gastrointestinal: Soft and nontender. No distention.  Musculoskeletal: Nontender with normal range of motion in all extremities.  Neurologic:  Normal speech and language. No gross focal neurologic deficits.  Equal grip strengths. Skin:  Skin is warm, dry and intact.  Psychiatric: Mood and affect are normal.   ____________________________________________    INITIAL IMPRESSION / ASSESSMENT AND PLAN / ED COURSE  Pertinent labs & imaging results that were available during my care of the patient were reviewed by me and considered in my medical decision making (see chart for details).  Patient presents to the emergency department for elevated blood pressure as well as a headache.  Differential would include hypertension, migraine, general headache, tension headache.  We will check labs, treat the patient's headache with a migraine cocktail medications and dose a one-time dose of clonidine.  Patient agreeable to plan.  Overall the patient appears well, no distress.  Patient's labs are reassuring.  ____________________________________________   FINAL CLINICAL IMPRESSION(S) / ED DIAGNOSES  Hypertension     Harvest Dark, MD 12/01/18 [phone]

## 2018-11-30 ENCOUNTER — Encounter
Admission: RE | Admit: 2018-11-30 | Discharge: 2018-11-30 | Disposition: A | Discharge status: Home / Self Care | Payer: BLUE CROSS/BLUE SHIELD | Source: Ambulatory Visit | Attending: Obstetrics and Gynecology | Admitting: Obstetrics and Gynecology

## 2018-11-30 ENCOUNTER — Other Ambulatory Visit: Payer: Self-pay

## 2018-11-30 HISTORY — DX: Anxiety disorder, unspecified: F41.9

## 2018-11-30 HISTORY — DX: Headache, unspecified: R51.9

## 2018-11-30 HISTORY — DX: Unspecified asthma, uncomplicated: J45.909

## 2018-11-30 HISTORY — DX: Headache: R51

## 2018-11-30 HISTORY — DX: Gastro-esophageal reflux disease without esophagitis: K21.9

## 2018-11-30 NOTE — Pre-Procedure Instructions (Signed)
Progress Notes - documented in this encounter  Lendon Colonel, PA - 11/29/2018 11:15 AM EST Formatting of this note might be different from the original. Chief Complaint  Patient presents with  . Blood Pressure Check  High yesterday at preop appointment.   Gabriela Thomas is a 37 y.o. female who is here for an acute visit.  She is here for concern of elevated blood pressure. She has struggled with hypertension for awhile now and we have been adjusting her medications. She was previously taking Amlodipine but stopped due to increased suprapubic pressure. She also was previously on Losartan but stopped this because she thought she was advised to stop it. She has been taking HCTZ 12.5 mg daily.  She went for a pre-op examination yesterday with OB/GYN and her blood pressure was elevated at 160/100. She is scheduled for an abdominal myomectomy to remove her fibroid uterus. She was advised to increase her HCTZ to 25 mg daily until the day of her surgery. She is scheduled for surgery in 5 days and has been told that she will not be able to undergo surgery if her blood pressure is elevated. She is experiencing a lot of pain because of the fibroids - she has been experiencing back pain as the result of her fibroids.   She has also been stressed recently because her mom is in the hospital and her mom found out she had to be on dialysis. This morning her coworker took her blood pressure and her diastolic reading was 800. She was experiencing dizziness as well. She has been taking Prozac 20 mg daily. She has been advised to increase to 30 mg daily, but has been unable to pick up the additional prescription due to insurance purposes.   Patient Active Problem List  Diagnosis  . History of uterine fibroid  . Essential hypertension  . Generalized anxiety disorder with panic attacks   Past Medical History:  Diagnosis Date  . Anxiety  . Hypertension  . Uterine fibroid   No past surgical history on  file.  Family History  Problem Relation Age of Onset  . High blood pressure (Hypertension) Mother  . COPD Mother  . Heart disease Mother  . Diabetes type II Mother  . High blood pressure (Hypertension) Father  . HIV Father  AIDS  . Cirrhosis Father  . High blood pressure (Hypertension) Maternal Grandmother  . Myocardial Infarction (Heart attack) Maternal Grandmother  . Cervical cancer Maternal Grandmother  . High blood pressure (Hypertension) Maternal Grandfather  . High blood pressure (Hypertension) Paternal Grandmother  . High blood pressure (Hypertension) Paternal Grandfather  . No Known Problems Daughter   Social History   Socioeconomic History  . Marital status: Single  Spouse name: Not on file  . Number of children: Not on file  . Years of education: Not on file  . Highest education level: Not on file  Occupational History  . Occupation: Chartered certified accountant  Social Needs  . Financial resource strain: Not on file  . Food insecurity:  Worry: Not on file  Inability: Not on file  . Transportation needs:  Medical: Not on file  Non-medical: Not on file  Tobacco Use  . Smoking status: Never Smoker  . Smokeless tobacco: Never Used  Substance and Sexual Activity  . Alcohol use: Yes  Alcohol/week: 2.0 standard drinks  Types: 2 Glasses of wine per week  Frequency: 4 or more times a week  . Drug use: No  . Sexual activity: Yes  Partners: Male  Birth control/protection: Condom, Pill  Lifestyle  . Physical activity:  Days per week: Not on file  Minutes per session: Not on file  . Stress: Not on file  Relationships  . Social connections:  Talks on phone: Not on file  Gets together: Not on file  Attends religious service: Not on file  Active member of club or organization: Not on file  Attends meetings of clubs or organizations: Not on file  Relationship status: Not on file  Other Topics Concern  . Not on file  Social History Narrative  . Not on  file   Current Outpatient Medications Ordered in Epic  Medication Sig Dispense Refill  . butalbital-acetaminophen-caffeine (FIORICET) 50-325-40 mg tablet Take 1 tablet by mouth every 6 (six) hours as needed for Headache 30 tablet 0  . FLUoxetine (PROZAC) 10 MG tablet 1  . hydroCHLOROthiazide (HYDRODIURIL) 12.5 MG tablet Take 1 tablet (12.5 mg total) by mouth once daily 90 tablet 1  . ibuprofen (ADVIL,MOTRIN) 600 MG tablet Take by mouth  . methocarbamol (ROBAXIN) 500 MG tablet Take by mouth  . norethindrone-ethinyl estradiol (JUNEL FE 1/20, 28,) 1 mg-20 mcg (21)/75 mg (7) tablet Take by mouth   No current Epic-ordered facility-administered medications on file.   No Known Allergies  Results for orders placed or performed in visit on 11/15/18  CBC w/auto Differential (3 Part)  Result Value Ref Range  WBC (White Blood Cell Count) 3.2 (L) 4.1 - 10.2 10^3/uL  RBC (Red Blood Cell Count) 4.22 4.04 - 5.48 10^6/uL  Hemoglobin 12.8 12.0 - 15.0 gm/dL  Hematocrit 38.5 35.0 - 47.0 %  MCV (Mean Corpuscular Volume) 91.2 80.0 - 100.0 fl  MCH (Mean Corpuscular Hemoglobin) 30.3 27.0 - 31.2 pg  MCHC (Mean Corpuscular Hemoglobin Concentration) 33.2 32.0 - 36.0 gm/dL  Platelet Count 213 150 - 450 10^3/uL  RDW-CV (Red Cell Distribution Width) 12.2 11.6 - 14.8 %  MPV (Mean Platelet Volume) 8.7 (L) 9.4 - 12.4 fl  Neutrophils 1.50 1.50 - 7.80 10^3/uL  Lymphocytes 1.50 1.00 - 3.60 10^3/uL  Mixed Count 0.20 0.10 - 0.90 10^3/uL  Neutrophil % 46.3 32.0 - 70.0 %  Lymphocyte % 46.7 10.0 - 50.0 %  Mixed % 7.0 3.0 - 14.4 %  Comprehensive Metabolic Panel (CMP)  Result Value Ref Range  Glucose 92 70 - 110 mg/dL  Sodium 140 136 - 145 mmol/L  Potassium 3.9 3.6 - 5.1 mmol/L  Chloride 104 97 - 109 mmol/L  Carbon Dioxide (CO2) 30.4 22.0 - 32.0 mmol/L  Urea Nitrogen (BUN) 17 7 - 25 mg/dL  Creatinine 1.1 0.6 - 1.1 mg/dL  Glomerular Filtration Rate (eGFR), MDRD Estimate 68 >60 mL/min/1.73sq m  Calcium 9.5 8.7 - 10.3  mg/dL  AST 19 8 - 39 U/L  ALT 13 5 - 38 U/L  Alk Phos (alkaline Phosphatase) 44 34 - 104 U/L  Albumin 4.0 3.5 - 4.8 g/dL  Bilirubin, Total 0.4 0.3 - 1.2 mg/dL  Protein, Total 7.0 6.1 - 7.9 g/dL  A/G Ratio 1.3 1.0 - 5.0 gm/dL  Hemoglobin A1C  Result Value Ref Range  Hemoglobin A1C 5.5 4.2 - 5.6 %  Average Blood Glucose (Calc) 111 mg/dL  Narrative  Normal Range: 4.2 - 5.6% Increased Risk: 5.7 - 6.4% Diabetes: >= 6.5% Glycemic Control for adults with diabetes: <7%  Lipid Panel w/calc LDL  Result Value Ref Range  Cholesterol, Total 159 100 - 200 mg/dL  Triglyceride 38 35 - 199 mg/dL  HDL (High Density Lipoprotein) Cholesterol  72.1 35.0 - 85.0 mg/dL  LDL (Low Density Lipoprotien), Calculated 79 0 - 130 mg/dL  VLDL Cholesterol 8 mg/dL  Cholesterol/HDL Ratio 2.2   BP Readings from Last 3 Encounters:  11/29/18 (!) 162/108  11/15/18 126/82  08/31/18 136/88   Wt Readings from Last 3 Encounters:  11/29/18 78.5 kg (173 lb)  11/15/18 77.1 kg (170 lb)  08/31/18 78 kg (172 lb)   Body mass index is 28.79 kg/m.  Review of Systems (as otherwise noted in HPI):  Negative for: fever, chills, nausea, or vomiting. Positive for:  Physical Exam:  Vitals:  11/29/18 1107  BP: (!) 162/108  BP Location: Left upper arm  Patient Position: Sitting  BP Cuff Size: Adult  Pulse: 90  SpO2: 99%  Weight: 78.5 kg (173 lb)  Height: 165.1 cm (_0 )  BP recheck: 150/100  General: Patient is well-groomed, well-nourished, appears stated age, in no acute distress.  HEENT: Head - atraumatic and normocephalic. Eyes - conjunctiva are clear bilaterally without injection, PERRL.   CV: Regular rhythm and normal heart rate, no murmur, no JVD.  Respiratory: Clear to auscultation throughout bilateral lung fields with no wheezing, crackles, or rhonchi. No increased work of breathing.  Assessment/Plan:  1. Essential hypertension 2. Generalized anxiety disorder with panic attacks 3. Uterine leiomyoma,  unspecified location She has been experiencing elevated blood pressure for the past several days. She underwent pre-op examination yesterday with OB/GYN prior to an abdominal myomectomy and her blood pressure was significantly elevated. She has been taking HCTZ 12.5 mg daily. She was originally additionally on Losartan 50, but has not been taking this medication for the past several months. We discussed that blood pressure can be elevated from pain or stress, both of which are contributing here. I will add back the Losartan 50 mg daily to her HCTZ 12.5 mg daily. Encouraged her to increase her Prozac to 30 mg as well, as she will benefit from this on a daily basis. She has been advised again to adhere to a DASH diet and to work on reducing her stress. Return precautions given.  - losartan (COZAAR) 50 MG tablet; Take 1 tablet (50 mg total) by mouth once daily Dispense: 90 tablet; Refill: 1  Wayland Denis, PA-C  Electronically signed by Lendon Colonel, PA at 11/29/2018 11:45 AM EST  Plan of Treatment - documented as of this encounter  Not on file  Visit Diagnoses - documented in this encounter  Diagnosis  Essential hypertension - Primary   Generalized anxiety disorder with panic attacks   Uterine leiomyoma, unspecified location   Discontinued Medications - documented as of this encounter  Medication Sig Discontinue Reason Start Date End Date  FLUoxetine (PROZAC) 10 MG tablet    03/28/2018 11/29/2018  Historical Medications - added in this encounter  This list may reflect changes made after this encounter.  Medication Sig Dispensed Refills Start Date End Date  FLUoxetine (PROZAC) 10 MG tablet  2 tablets (20 mg total) once daily  1 11/29/2018   Images Patient Contacts   Contact Name Contact Address Communication Relationship to Patient  Edward Qualia [address] [place], [place] [address] [phone] [facility]) [phone] [facility]) Son or Daughter, Emergency Contact  Document  Information  Primary Care Provider Other Service Providers Document Coverage Dates  Lendon Colonel, [place] (Feb. 19, 2018February 19, 2018 - Present) DM: [phone] (Work) [phone] (Fax) [place], [place] [address] Family Medicine [facility] [place], [place]  [address]  Dec. 04, 2019December 04, 2019   [facility] [address] [place], [place] [address]   Encounter Providers Encounter Date  Lendon Colonel, [place] (Attending) DM: [phone] (Work) [phone] (Fax) [place],  [address] Family Medicine Dec. 04, 2019December 04, 2019

## 2018-11-30 NOTE — Patient Instructions (Signed)
Your procedure is scheduled on: 12-04-18 MONDAY Report to Same Day Surgery 2nd floor medical mall [facility] Entrance-take elevator on left to 2nd floor.  Check in with surgery information desk.) To find out your arrival time please call [phone] between 1PM - 3PM on 12-01-18 FRIDAY  Remember: Instructions that are not followed completely may result in serious medical risk, up to and including death, or upon the discretion of your surgeon and anesthesiologist your surgery may need to be rescheduled.    _x___ 1. Do not eat food after midnight the night before your procedure. NO GUM OR CANDY AFTER MIDNIGHT.  You may drink clear liquids up to 2 hours before you are scheduled to arrive at the hospital for your procedure.  Do not drink clear liquids within 2 hours of your scheduled arrival to the hospital.  Clear liquids include  --Water or Apple juice without pulp  --Clear carbohydrate beverage such as ClearFast or Gatorade  --Black Coffee or Clear Tea (No milk, no creamers, do not add anything to the coffee or Tea   ____Ensure clear carbohydrate drink on the way to the hospital for bariatric patients  ____Ensure clear carbohydrate drink 3 hours before surgery for Dr Dwyane Luo patients if physician instructed.    __x__ 2. No Alcohol for 24 hours before or after surgery.   __x__3. No Smoking or e-cigarettes for 24 prior to surgery.  Do not use any chewable tobacco products for at least 6 hour prior to surgery   ____  4. Bring all medications with you on the day of surgery if instructed.    __x__ 5. Notify your doctor if there is any change in your medical condition     (cold, fever, infections).    x___6. On the morning of surgery brush your teeth with toothpaste and water.  You may rinse your mouth with mouth wash if you wish.  Do not swallow any toothpaste or mouthwash.   Do not wear jewelry, make-up, hairpins, clips or nail polish.  Do not wear lotions, powders, or perfumes. You  may wear deodorant.  Do not shave 48 hours prior to surgery. Men may shave face and neck.  Do not bring valuables to the hospital.    [facility] is not responsible for any belongings or valuables.               Contacts, dentures or bridgework may not be worn into surgery.  Leave your suitcase in the car. After surgery it may be brought to your room.  For patients admitted to the hospital, discharge time is determined by your treatment team.  _  Patients discharged the day of surgery will not be allowed to drive home.  You will need someone to drive you home and stay with you the night of your procedure.    Please read over the following fact sheets that you were given:   [facility] Preparing for Surgery   _x___ TAKE THE FOLLOWING MEDICATION THE MORNING OF SURGERY WITH A SMALL SIP OF WATER. These include:  1. PROZAC  2.  3.  4.  5.  6.  ____Fleets enema or Magnesium Citrate as directed.   _x___ Use CHG Soap or sage wipes as directed on instruction sheet   ____ Use inhalers on the day of surgery and bring to hospital day of surgery  ____ Stop Metformin and Janumet 2 days prior to surgery.    ____ Take 1/2 of usual insulin dose the night before surgery  and none on the morning surgery.   ____ Follow recommendations from Cardiologist, Pulmonologist or PCP regarding stopping Aspirin, Coumadin, Plavix ,Eliquis, Effient, or Pradaxa, and Pletal.  X____Stop Anti-inflammatories such as Advil, Aleve, Ibuprofen, Motrin, Naproxen, Naprosyn, Goodies powders or aspirin products NOW-OK to take Tylenol    _x___ Stop supplements until after surgery-STOP EVENING PRIMROSE OIL NOW-MAY RESUME AFTER SURGERY   ____ Bring C-Pap to the hospital.

## 2018-12-01 ENCOUNTER — Encounter
Admission: RE | Admit: 2018-12-01 | Discharge: 2018-12-01 | Disposition: A | Discharge status: Home / Self Care | Payer: BLUE CROSS/BLUE SHIELD | Source: Ambulatory Visit | Attending: Obstetrics and Gynecology | Admitting: Obstetrics and Gynecology

## 2018-12-01 DIAGNOSIS — Z01812 Encounter for preprocedural laboratory examination: Secondary | ICD-10-CM | POA: Insufficient documentation

## 2018-12-02 LAB — RPR: RPR Ser Ql: NONREACTIVE

## 2018-12-03 MED ORDER — CEFAZOLIN SODIUM-DEXTROSE 2-4 GM/100ML-% IV SOLN: 2.0000 g | INTRAVENOUS | Status: DC

## 2018-12-04 ENCOUNTER — Encounter: Admission: RE | Payer: Self-pay | Source: Ambulatory Visit

## 2018-12-04 ENCOUNTER — Telehealth: Payer: Self-pay | Admitting: Obstetrics and Gynecology

## 2018-12-04 ENCOUNTER — Ambulatory Visit
Admission: RE | Admit: 2018-12-04 | Payer: BLUE CROSS/BLUE SHIELD | Source: Ambulatory Visit | Admitting: Obstetrics and Gynecology

## 2018-12-04 SURGERY — MYOMECTOMY, ABDOMINAL APPROACH
Anesthesia: General

## 2018-12-04 NOTE — Telephone Encounter (Signed)
Message was sent to [facility].

## 2018-12-04 NOTE — Telephone Encounter (Signed)
The patient called and stated that she will be unable to do her surgery today with Dr. Marcelline Mates due to her mother passing away in ICU today. The patient would like a call back to reschedule her surgery to the next available date. And to notify her provider. Please advise.

## 2018-12-06 LAB — HIV-1/2 AB - DIFFERENTIATION
HIV 1 Ab: NEGATIVE
HIV 2 Ab: NEGATIVE
Note: NEGATIVE

## 2018-12-06 LAB — RNA QUALITATIVE: HIV 1 RNA Qualitative: 1

## 2018-12-11 ENCOUNTER — Ambulatory Visit: Payer: BLUE CROSS/BLUE SHIELD | Admitting: Anesthesiology

## 2018-12-11 ENCOUNTER — Other Ambulatory Visit: Payer: Self-pay

## 2018-12-11 ENCOUNTER — Encounter
Admission: RE | Disposition: A | Discharge status: Home / Self Care | Payer: Self-pay | Source: Home / Self Care | Attending: Obstetrics and Gynecology

## 2018-12-11 ENCOUNTER — Observation Stay
Admission: RE | Admit: 2018-12-11 | Discharge: 2018-12-13 | DRG: 742 | Disposition: A | Discharge status: Home / Self Care | Payer: BLUE CROSS/BLUE SHIELD | Attending: Obstetrics and Gynecology | Admitting: Obstetrics and Gynecology

## 2018-12-11 DIAGNOSIS — I10 Essential (primary) hypertension: Secondary | ICD-10-CM

## 2018-12-11 DIAGNOSIS — D251 Intramural leiomyoma of uterus: Principal | ICD-10-CM | POA: Diagnosis present

## 2018-12-11 DIAGNOSIS — D649 Anemia, unspecified: Secondary | ICD-10-CM | POA: Diagnosis not present

## 2018-12-11 DIAGNOSIS — N83202 Unspecified ovarian cyst, left side: Secondary | ICD-10-CM | POA: Diagnosis not present

## 2018-12-11 DIAGNOSIS — M549 Dorsalgia, unspecified: Secondary | ICD-10-CM

## 2018-12-11 DIAGNOSIS — E861 Hypovolemia: Secondary | ICD-10-CM | POA: Diagnosis not present

## 2018-12-11 DIAGNOSIS — K661 Hemoperitoneum: Secondary | ICD-10-CM | POA: Diagnosis not present

## 2018-12-11 DIAGNOSIS — N179 Acute kidney failure, unspecified: Secondary | ICD-10-CM | POA: Diagnosis not present

## 2018-12-11 DIAGNOSIS — Z9889 Other specified postprocedural states: Secondary | ICD-10-CM

## 2018-12-11 DIAGNOSIS — N939 Abnormal uterine and vaginal bleeding, unspecified: Secondary | ICD-10-CM | POA: Diagnosis not present

## 2018-12-11 DIAGNOSIS — N938 Other specified abnormal uterine and vaginal bleeding: Secondary | ICD-10-CM

## 2018-12-11 DIAGNOSIS — D259 Leiomyoma of uterus, unspecified: Secondary | ICD-10-CM | POA: Diagnosis present

## 2018-12-11 HISTORY — PX: MYOMECTOMY: SHX85

## 2018-12-11 LAB — CBC
HCT: 27 % — ABNORMAL LOW (ref 36.0–46.0)
Hemoglobin: 8.6 g/dL — ABNORMAL LOW (ref 12.0–15.0)
MCH: 30.9 pg (ref 26.0–34.0)
MCHC: 31.9 g/dL (ref 30.0–36.0)
MCV: 97.1 fL (ref 80.0–100.0)
PLATELETS: 252 10*3/uL (ref 150–400)
RBC: 2.78 MIL/uL — AB (ref 3.87–5.11)
RDW: 12.8 % (ref 11.5–15.5)
WBC: 21 10*3/uL — ABNORMAL HIGH (ref 4.0–10.5)
nRBC: 0 % (ref 0.0–0.2)

## 2018-12-11 LAB — ABO/RH: ABO/RH(D): B NEG

## 2018-12-11 SURGERY — MYOMECTOMY, ABDOMINAL APPROACH
Anesthesia: General

## 2018-12-11 MED ORDER — ONDANSETRON HCL 4 MG/2ML IJ SOLN
INTRAMUSCULAR | Status: AC
  Filled 2018-12-11: qty 2

## 2018-12-11 MED ORDER — LIDOCAINE 5 % EX PTCH
MEDICATED_PATCH | CUTANEOUS | Status: DC | PRN
  Administered 2018-12-11: 1 via TRANSDERMAL

## 2018-12-11 MED ORDER — HYDROMORPHONE HCL 1 MG/ML IJ SOLN
INTRAMUSCULAR | Status: AC
  Administered 2018-12-11: 0.5 mg via INTRAVENOUS
  Filled 2018-12-11: qty 1

## 2018-12-11 MED ORDER — ONDANSETRON HCL 4 MG/2ML IJ SOLN
INTRAMUSCULAR | Status: DC | PRN
  Administered 2018-12-11: 4 mg via INTRAVENOUS

## 2018-12-11 MED ORDER — SUGAMMADEX SODIUM 200 MG/2ML IV SOLN
INTRAVENOUS | Status: DC | PRN
  Administered 2018-12-11: 100 mg via INTRAVENOUS

## 2018-12-11 MED ORDER — ONDANSETRON HCL 4 MG PO TABS: 4.0000 mg | ORAL_TABLET | Freq: Four times a day (QID) | ORAL | Status: DC | PRN

## 2018-12-11 MED ORDER — HYDROMORPHONE HCL 1 MG/ML IJ SOLN
0.5000 mg | INTRAMUSCULAR | Status: DC | PRN
  Administered 2018-12-11 (×3): 0.5 mg via INTRAVENOUS

## 2018-12-11 MED ORDER — VASOPRESSIN 20 UNIT/ML IV SOLN
INTRAVENOUS | Status: AC
  Filled 2018-12-11: qty 1

## 2018-12-11 MED ORDER — EPHEDRINE SULFATE 50 MG/ML IJ SOLN
INTRAMUSCULAR | Status: DC | PRN
  Administered 2018-12-11: 5 mg via INTRAVENOUS

## 2018-12-11 MED ORDER — FENTANYL CITRATE (PF) 100 MCG/2ML IJ SOLN
INTRAMUSCULAR | Status: AC
  Filled 2018-12-11: qty 2

## 2018-12-11 MED ORDER — SODIUM CHLORIDE 0.9% IV SOLUTION
Freq: Once | INTRAVENOUS | Status: AC
  Administered 2018-12-12: 03:00:00 via INTRAVENOUS

## 2018-12-11 MED ORDER — ZOLPIDEM TARTRATE 5 MG PO TABS: 5.0000 mg | ORAL_TABLET | Freq: Every evening | ORAL | Status: DC | PRN

## 2018-12-11 MED ORDER — HYDROMORPHONE HCL 1 MG/ML IJ SOLN
0.2000 mg | INTRAMUSCULAR | Status: DC | PRN
  Administered 2018-12-11: 0.6 mg via INTRAVENOUS
  Administered 2018-12-12 – 2018-12-13 (×4): 0.4 mg via INTRAVENOUS
  Filled 2018-12-11 (×5): qty 1

## 2018-12-11 MED ORDER — FENTANYL CITRATE (PF) 100 MCG/2ML IJ SOLN
INTRAMUSCULAR | Status: AC
  Administered 2018-12-11: 25 ug via INTRAVENOUS
  Filled 2018-12-11: qty 2

## 2018-12-11 MED ORDER — PROMETHAZINE HCL 25 MG/ML IJ SOLN
25.0000 mg | Freq: Four times a day (QID) | INTRAMUSCULAR | Status: DC | PRN
  Administered 2018-12-11: 25 mg via INTRAVENOUS
  Filled 2018-12-11: qty 1

## 2018-12-11 MED ORDER — LIDOCAINE HCL (CARDIAC) PF 100 MG/5ML IV SOSY
PREFILLED_SYRINGE | INTRAVENOUS | Status: DC | PRN
  Administered 2018-12-11: 60 mg via INTRAVENOUS

## 2018-12-11 MED ORDER — SUCCINYLCHOLINE CHLORIDE 20 MG/ML IJ SOLN
INTRAMUSCULAR | Status: DC | PRN
  Administered 2018-12-11: 100 mg via INTRAVENOUS

## 2018-12-11 MED ORDER — BISACODYL 10 MG RE SUPP: 10.0000 mg | Freq: Every day | RECTAL | Status: DC | PRN

## 2018-12-11 MED ORDER — CEFAZOLIN SODIUM-DEXTROSE 2-4 GM/100ML-% IV SOLN
INTRAVENOUS | Status: AC
  Filled 2018-12-11: qty 100

## 2018-12-11 MED ORDER — LACTATED RINGERS IV SOLN
INTRAVENOUS | Status: DC
  Administered 2018-12-11: 18:00:00 via INTRAVENOUS

## 2018-12-11 MED ORDER — FENTANYL CITRATE (PF) 100 MCG/2ML IJ SOLN
25.0000 ug | INTRAMUSCULAR | Status: AC | PRN
  Administered 2018-12-11 (×2): 25 ug via INTRAVENOUS

## 2018-12-11 MED ORDER — KETOROLAC TROMETHAMINE 30 MG/ML IJ SOLN
30.0000 mg | Freq: Four times a day (QID) | INTRAMUSCULAR | Status: DC
  Administered 2018-12-11 – 2018-12-12 (×2): 30 mg via INTRAVENOUS
  Filled 2018-12-11 (×2): qty 1

## 2018-12-11 MED ORDER — LACTATED RINGERS IV SOLN
INTRAVENOUS | Status: DC
  Administered 2018-12-11: 12:00:00 via INTRAVENOUS

## 2018-12-11 MED ORDER — MIDAZOLAM HCL 2 MG/2ML IJ SOLN
INTRAMUSCULAR | Status: AC
  Filled 2018-12-11: qty 2

## 2018-12-11 MED ORDER — NALOXONE HCL 4 MG/10ML IJ SOLN
0.2500 mg/h | Freq: Once | INTRAVENOUS | Status: DC
  Filled 2018-12-11: qty 10

## 2018-12-11 MED ORDER — DOCUSATE SODIUM 100 MG PO CAPS
100.0000 mg | ORAL_CAPSULE | Freq: Two times a day (BID) | ORAL | Status: DC
  Administered 2018-12-12 – 2018-12-13 (×3): 100 mg via ORAL
  Filled 2018-12-11 (×3): qty 1

## 2018-12-11 MED ORDER — IBUPROFEN 800 MG PO TABS: 800.0000 mg | ORAL_TABLET | Freq: Four times a day (QID) | ORAL | Status: DC

## 2018-12-11 MED ORDER — PROPOFOL 10 MG/ML IV BOLUS
INTRAVENOUS | Status: AC
  Filled 2018-12-11: qty 20

## 2018-12-11 MED ORDER — FLUOXETINE HCL 10 MG PO CAPS
10.0000 mg | ORAL_CAPSULE | ORAL | Status: DC
  Administered 2018-12-13: 10 mg via ORAL
  Filled 2018-12-11: qty 1

## 2018-12-11 MED ORDER — LOSARTAN POTASSIUM 50 MG PO TABS: 50.0000 mg | ORAL_TABLET | Freq: Every day | ORAL | Status: DC

## 2018-12-11 MED ORDER — MIDAZOLAM HCL 2 MG/2ML IJ SOLN
INTRAMUSCULAR | Status: DC | PRN
  Administered 2018-12-11: 2 mg via INTRAVENOUS

## 2018-12-11 MED ORDER — ONDANSETRON HCL 4 MG/2ML IJ SOLN: 4.0000 mg | Freq: Four times a day (QID) | INTRAMUSCULAR | Status: DC | PRN

## 2018-12-11 MED ORDER — OXYCODONE-ACETAMINOPHEN 5-325 MG PO TABS
1.0000 | ORAL_TABLET | ORAL | Status: DC | PRN
  Administered 2018-12-12: 2 via ORAL
  Filled 2018-12-11: qty 2

## 2018-12-11 MED ORDER — CEFAZOLIN SODIUM-DEXTROSE 2-4 GM/100ML-% IV SOLN
2.0000 g | Freq: Once | INTRAVENOUS | Status: AC
  Administered 2018-12-11: 2 g via INTRAVENOUS

## 2018-12-11 MED ORDER — KETOROLAC TROMETHAMINE 30 MG/ML IJ SOLN
INTRAMUSCULAR | Status: DC | PRN
  Administered 2018-12-11: 15 mg via INTRAVENOUS

## 2018-12-11 MED ORDER — MAGNESIUM CITRATE PO SOLN
1.0000 | Freq: Once | ORAL | Status: DC | PRN
  Filled 2018-12-11: qty 296

## 2018-12-11 MED ORDER — NORETHIN ACE-ETH ESTRAD-FE 1.5-30 MG-MCG PO TABS: 1.0000 | ORAL_TABLET | Freq: Every day | ORAL | Status: DC

## 2018-12-11 MED ORDER — ALUM & MAG HYDROXIDE-SIMETH 200-200-20 MG/5ML PO SUSP: 30.0000 mL | ORAL | Status: DC | PRN

## 2018-12-11 MED ORDER — LACTATED RINGERS IV SOLN
INTRAVENOUS | Status: DC
  Administered 2018-12-11: 13:00:00 via INTRAVENOUS

## 2018-12-11 MED ORDER — DEXAMETHASONE SODIUM PHOSPHATE 10 MG/ML IJ SOLN
INTRAMUSCULAR | Status: AC
  Filled 2018-12-11: qty 1

## 2018-12-11 MED ORDER — LACTATED RINGERS IV BOLUS
1000.0000 mL | Freq: Once | INTRAVENOUS | Status: AC
  Administered 2018-12-11: 1000 mL via INTRAVENOUS

## 2018-12-11 MED ORDER — FAMOTIDINE 20 MG PO TABS: 20.0000 mg | ORAL_TABLET | Freq: Once | ORAL | Status: DC

## 2018-12-11 MED ORDER — PANTOPRAZOLE SODIUM 40 MG PO TBEC
40.0000 mg | DELAYED_RELEASE_TABLET | Freq: Every day | ORAL | Status: DC
  Administered 2018-12-12 – 2018-12-13 (×2): 40 mg via ORAL
  Filled 2018-12-11 (×2): qty 1

## 2018-12-11 MED ORDER — LIDOCAINE HCL (PF) 2 % IJ SOLN
INTRAMUSCULAR | Status: AC
  Filled 2018-12-11: qty 10

## 2018-12-11 MED ORDER — VASOPRESSIN 20 UNIT/ML IV SOLN
INTRAVENOUS | Status: DC | PRN
  Administered 2018-12-11: 27 mL via INTRAMUSCULAR

## 2018-12-11 MED ORDER — PROPOFOL 10 MG/ML IV BOLUS
INTRAVENOUS | Status: DC | PRN
  Administered 2018-12-11: 150 mg via INTRAVENOUS

## 2018-12-11 MED ORDER — LIDOCAINE 5 % EX PTCH
1.0000 | MEDICATED_PATCH | CUTANEOUS | Status: DC
  Filled 2018-12-11 (×2): qty 1

## 2018-12-11 MED ORDER — MENTHOL 3 MG MT LOZG
1.0000 | LOZENGE | OROMUCOSAL | Status: DC | PRN
  Administered 2018-12-12: 3 mg via ORAL
  Filled 2018-12-11 (×2): qty 9

## 2018-12-11 MED ORDER — HYDROCHLOROTHIAZIDE 25 MG PO TABS: 25.0000 mg | ORAL_TABLET | Freq: Every day | ORAL | Status: DC

## 2018-12-11 MED ORDER — DEXAMETHASONE SODIUM PHOSPHATE 10 MG/ML IJ SOLN
INTRAMUSCULAR | Status: DC | PRN
  Administered 2018-12-11: 5 mg via INTRAVENOUS

## 2018-12-11 MED ORDER — LIDOCAINE 5 % EX PTCH
1.0000 | MEDICATED_PATCH | CUTANEOUS | Status: DC
  Administered 2018-12-13: 1 via TRANSDERMAL
  Filled 2018-12-11: qty 1

## 2018-12-11 MED ORDER — NALOXONE HCL 0.4 MG/ML IJ SOLN
INTRAMUSCULAR | Status: AC
  Filled 2018-12-11: qty 1

## 2018-12-11 MED ORDER — SENNOSIDES-DOCUSATE SODIUM 8.6-50 MG PO TABS: 1.0000 | ORAL_TABLET | Freq: Every evening | ORAL | Status: DC | PRN

## 2018-12-11 MED ORDER — FLUOXETINE HCL 20 MG PO CAPS
20.0000 mg | ORAL_CAPSULE | ORAL | Status: DC
  Administered 2018-12-13: 20 mg via ORAL
  Filled 2018-12-11 (×2): qty 1
  Filled 2018-12-11: qty 2
  Filled 2018-12-11 (×2): qty 1

## 2018-12-11 MED ORDER — FENTANYL CITRATE (PF) 100 MCG/2ML IJ SOLN
INTRAMUSCULAR | Status: DC | PRN
  Administered 2018-12-11: 25 ug via INTRAVENOUS
  Administered 2018-12-11: 50 ug via INTRAVENOUS
  Administered 2018-12-11: 25 ug via INTRAVENOUS

## 2018-12-11 MED ORDER — ONDANSETRON HCL 4 MG/2ML IJ SOLN: 4.0000 mg | Freq: Once | INTRAMUSCULAR | Status: DC | PRN

## 2018-12-11 MED ORDER — LACTATED RINGERS IV SOLN
INTRAVENOUS | Status: DC
  Administered 2018-12-11 – 2018-12-13 (×7): via INTRAVENOUS

## 2018-12-11 MED ORDER — HEPARIN SODIUM (PORCINE) 10000 UNIT/ML IJ SOLN
INTRAMUSCULAR | Status: AC
  Filled 2018-12-11: qty 3

## 2018-12-11 MED ORDER — ROCURONIUM BROMIDE 50 MG/5ML IV SOLN
INTRAVENOUS | Status: AC
  Filled 2018-12-11: qty 1

## 2018-12-11 MED ORDER — SUGAMMADEX SODIUM 200 MG/2ML IV SOLN
INTRAVENOUS | Status: AC
  Filled 2018-12-11: qty 2

## 2018-12-11 MED ORDER — NALOXONE HCL 0.4 MG/ML IJ SOLN
0.4000 mg | INTRAMUSCULAR | Status: DC | PRN
  Administered 2018-12-11: 0.4 mg via INTRAVENOUS

## 2018-12-11 MED ORDER — SUCCINYLCHOLINE CHLORIDE 20 MG/ML IJ SOLN
INTRAMUSCULAR | Status: AC
  Filled 2018-12-11: qty 1

## 2018-12-11 MED ORDER — LIDOCAINE 5 % EX PTCH
MEDICATED_PATCH | CUTANEOUS | Status: AC
  Filled 2018-12-11: qty 1

## 2018-12-11 MED ORDER — SIMETHICONE 80 MG PO CHEW: 80.0000 mg | CHEWABLE_TABLET | Freq: Four times a day (QID) | ORAL | Status: DC | PRN

## 2018-12-11 MED ORDER — ROCURONIUM BROMIDE 100 MG/10ML IV SOLN
INTRAVENOUS | Status: DC | PRN
  Administered 2018-12-11: 20 mg via INTRAVENOUS
  Administered 2018-12-11: 5 mg via INTRAVENOUS

## 2018-12-11 MED ORDER — FAMOTIDINE 20 MG PO TABS
ORAL_TABLET | ORAL | Status: AC
  Filled 2018-12-11: qty 1

## 2018-12-11 MED ORDER — SODIUM CHLORIDE (PF) 0.9 % IJ SOLN
INTRAMUSCULAR | Status: AC
  Filled 2018-12-11: qty 50

## 2018-12-11 MED ORDER — FENTANYL CITRATE (PF) 100 MCG/2ML IJ SOLN
25.0000 ug | INTRAMUSCULAR | Status: AC | PRN
  Administered 2018-12-11 (×6): 25 ug via INTRAVENOUS

## 2018-12-11 SURGICAL SUPPLY — 38 items
BARRIER ADHS 3X4 INTERCEED (GAUZE/BANDAGES/DRESSINGS) ×6 IMPLANT
CANISTER SUCT 1200ML W/VALVE (MISCELLANEOUS) ×3 IMPLANT
CELL SAVER STANDBY W/COLL (MISCELLANEOUS) ×3
CHLORAPREP W/TINT 26ML (MISCELLANEOUS) ×3 IMPLANT
COVER WAND RF STERILE (DRAPES) ×3 IMPLANT
DERMABOND ADVANCED (GAUZE/BANDAGES/DRESSINGS) ×2
DERMABOND ADVANCED .7 DNX12 (GAUZE/BANDAGES/DRESSINGS) ×1 IMPLANT
DRAPE LAPAROTOMY 100X77 ABD (DRAPES) IMPLANT
DRAPE LAPAROTOMY TRNSV 106X77 (MISCELLANEOUS) ×6 IMPLANT
DRSG TELFA 3X8 NADH (GAUZE/BANDAGES/DRESSINGS) ×3 IMPLANT
ELECT BLADE 6 FLAT ULTRCLN (ELECTRODE) IMPLANT
ELECT REM PT RETURN 9FT ADLT (ELECTROSURGICAL) ×3
ELECTRODE REM PT RTRN 9FT ADLT (ELECTROSURGICAL) ×1 IMPLANT
GAUZE SPONGE 4X4 12PLY STRL (GAUZE/BANDAGES/DRESSINGS) ×3 IMPLANT
GLOVE BIO SURGEON STRL SZ 6.5 (GLOVE) ×2 IMPLANT
GLOVE BIO SURGEONS STRL SZ 6.5 (GLOVE) ×1
GLOVE INDICATOR 7.0 STRL GRN (GLOVE) ×18 IMPLANT
GOWN STRL REUS W/ TWL LRG LVL3 (GOWN DISPOSABLE) ×2 IMPLANT
GOWN STRL REUS W/TWL LRG LVL3 (GOWN DISPOSABLE) ×4
KIT TURNOVER CYSTO (KITS) ×3 IMPLANT
LABEL OR SOLS (LABEL) ×3 IMPLANT
NS IRRIG 1000ML POUR BTL (IV SOLUTION) ×3 IMPLANT
PACK BASIN MAJOR ARMC (MISCELLANEOUS) ×3 IMPLANT
SPONGE LAP 18X18 RF (DISPOSABLE) ×3 IMPLANT
STANDBY W/COLL CELL SAVER (MISCELLANEOUS) ×1 IMPLANT
STAPLER SKIN PROX 35W (STAPLE) ×3 IMPLANT
SUT MNCRL 4-0 (SUTURE) ×2
SUT MNCRL 4-0 27XMFL (SUTURE) ×1
SUT VIC AB 0 CT1 27 (SUTURE)
SUT VIC AB 0 CT1 27XCR 8 STRN (SUTURE) IMPLANT
SUT VIC AB 0 CT1 36 (SUTURE) ×6 IMPLANT
SUT VIC AB 2-0 CT1 (SUTURE) ×18 IMPLANT
SUT VICRYL+ 3-0 36IN CT-1 (SUTURE) ×15 IMPLANT
SUTURE MNCRL 4-0 27XMF (SUTURE) ×1 IMPLANT
SYR BULB IRRIG 60ML STRL (SYRINGE) ×3 IMPLANT
SYR CONTROL 10ML (SYRINGE) ×3 IMPLANT
TRAY FOLEY MTR SLVR 16FR STAT (SET/KITS/TRAYS/PACK) ×3 IMPLANT
TRAY PREP VAG/GEN (MISCELLANEOUS) ×3 IMPLANT

## 2018-12-11 NOTE — Anesthesia Procedure Notes (Signed)
Procedure Name: Intubation Date/Time: 12/11/2018 1:08 PM Performed by: Zetta Bills, CRNA Pre-anesthesia Checklist: Patient identified, Emergency Drugs available, Suction available and Patient being monitored Patient Re-evaluated:Patient Re-evaluated prior to induction Oxygen Delivery Method: Circle system utilized Preoxygenation: Pre-oxygenation with 100% oxygen Induction Type: IV induction Ventilation: Mask ventilation without difficulty Laryngoscope Size: Mac and 3 Grade View: Grade I Tube type: Oral Tube size: 7.0 mm Number of attempts: 1 Airway Equipment and Method: Stylet Placement Confirmation: ETT inserted through vocal cords under direct vision Secured at: 21 cm Tube secured with: Tape Dental Injury: Teeth and Oropharynx as per pre-operative assessment

## 2018-12-11 NOTE — Transfer of Care (Signed)
Immediate Anesthesia Transfer of Care Note  Patient: Gabriela Thomas  Procedure(s) Performed: ABDOMINAL MYOMECTOMY (N/A )  Patient Location: PACU  Anesthesia Type:General  Level of Consciousness: awake  Airway & Oxygen Therapy: Patient Spontanous Breathing  Post-op Assessment: Report given to RN  Post vital signs: stable  Last Vitals:  Vitals Value Taken Time  BP 165/80 12/11/2018  3:00 PM  Temp 36.6 C 12/11/2018  2:59 PM  Pulse 94 12/11/2018  3:04 PM  Resp 23 12/11/2018  3:04 PM  SpO2 100 % 12/11/2018  3:04 PM  Vitals shown include unvalidated device data.  Last Pain:  Vitals:   12/11/18 1459  TempSrc:   PainSc: 10-Worst pain ever         Complications: No apparent anesthesia complications

## 2018-12-11 NOTE — Interval H&P Note (Signed)
History and Physical Interval Note:  12/11/2018 12:19 PM  Gabriela Thomas  has presented today for surgery, with the diagnosis of INTRAMURAL LEIOMYOMA OF UTERUS, BREAKTHROUGH BLEEDING ON OCP'S, ACUTE MIDLINE BACK PAIN, ESSENTIAL HYPERTENSION  The various methods of treatment have been discussed with the patient and family. After consideration of risks, benefits and other options for treatment, the patient has consented to  Procedure(s): ABDOMINAL MYOMECTOMY (N/A) as a surgical intervention .  The patient's history has been reviewed, patient examined, no change in status, stable for surgery.  I have reviewed the patient's chart and labs.  Questions were answered to the patient's satisfaction.     Rubie Maid, MD [facility]

## 2018-12-11 NOTE — Anesthesia Post-op Follow-up Note (Signed)
Anesthesia QCDR form completed.        

## 2018-12-11 NOTE — Op Note (Addendum)
Procedure(s): ABDOMINAL MYOMECTOMY Procedure Note  Gabriela Thomas female 37 y.o. 12/11/2018  Indications: The patient is a 37 y.o. G31P1001 female with enlarged fibroid uterus, back pain, abnormal uterine bleeding.   Pre-operative Diagnosis: Enlarged fibroid uterus, back pain, abnormal uterine bleeding  Post-operative Diagnosis: Same  Surgeon: Rubie Maid, MD  Assistants:  Malachi Paradise, MD.  No other capable assistant available in surgery requiring a high level assistant.   Anesthesia: General endotracheal anesthesia  Findings: The uterus was enlarged, ~ 18 week sized on exam under anesthesia.   Uterine leiomyomata. At least 12 in number, ranging in size  From 1cm to 8 cm, submucosal and subserosal in location, with 4 located on posterior uterine surface and the other 7 located on the anterior surface of the uterus.  There was one fibroid near the round ligament and fallopian tube that was not excised due to location near the uterine vessels and very small size (~ 2 cm).  Endometrial cavity was not breached. Fallopian tubes and ovaries appeared normal.   Procedure Details: The patient was seen in the Holding Room. The risks, benefits, complications, treatment options, and expected outcomes were discussed with the patient.  The patient concurred with the proposed plan, giving informed consent.  The site of surgery properly noted/marked. The patient was taken to the Operating Room, identified as [name] and the procedure verified as Procedure(s) (LRB): ABDOMINAL MYOMECTOMY (N/A). The patient received IV preoperative antibiotics approximately 30 minutes prior to procedure.    She was then placed under general anesthesia without difficulty and SCDs were placed on the lower extremities.   She was placed in dorsal supine position and prepped and draped in a sterile manner.  A Foley catheter was inserted into the bladder and attached to constant drainage.  After an adequate  timeout was performed, attention was then turned to the abdomen where a Pfannenstiel incision was made with a scalpel.  This incision was carried down to the fascia using electrocautery.  The fascia was incised in the midline and this incision was extended bilaterally using the Mayo scissors.  Kocher's were applied to the superior aspect of the fascial incision, and the rectus muscles were dissected off bluntly and sharply using the Mayo scissors.  A similar process was carried out at the inferior aspect of the incision.  The rectus muscles were separated in the midline bluntly and the peritoneum was entered bluntly, with good visualization of the bladder and bowel.  The uterus was then exteriorized from the abdomen.  Attention was then turned to the uterus where the myomas were noted.  No retractors were required due to the size of the uterus and fibroids.    Vasopressin solution was injected over the surface of each of the leiomyomas to aid with hemostasis.   Three separate vertical incision was made over the uterine leiomyoma into the leiomyoma, and the capsule was recognized.  Using blunt methods, the leiomyomas was freed from the surrounding myometrial tissue and removed intact. The incisions were closed in multiple layers, using 0 Vicryl running interlocking stitches, and the serosa was reapproximated using either a 2-0 Vicryl or a 3-0 Vicryl stitch (depending on depth of defect). The uterine cavity was not breached. Overall good hemostasis was noted    The uterus was returned to the abdomen and Surgicele was placed over the uterine incisions as an adhesive barrier over the incisions.  The fascia was reapproximated with 0-Vicryl running stitch  The skin was closed with 4-0 Monocryl  subcuticular stitch and covered with Dermabond. The patient tolerated the procedure well. There were no complications during this case.  Sponge, lap, needle and instrument counts were correct x 3.  The patient was taken to the  recovery room extubated and in stable condition.  Estimated Blood Loss:  200 ml      Drains: straight catheterization prior to procedure with 200 ml of clear urine         Total IV Fluids:  750  ml  Specimens: Leiomyomata sent to pathology         Implants: None         Complications:  None; patient tolerated the procedure well.         Disposition: PACU - hemodynamically stable.         Condition: stable   Rubie Maid, MD [facility]

## 2018-12-11 NOTE — Anesthesia Preprocedure Evaluation (Signed)
Anesthesia Evaluation  Patient identified by MRN, date of birth, ID band Patient awake    Reviewed: Allergy & Precautions, H&P , NPO status , Patient's Chart, lab work & pertinent test results, reviewed documented beta blocker date and time   Airway Mallampati: II  TM Distance: >3 FB Neck ROM: full    Dental  (+) Teeth Intact   Pulmonary asthma ,    Pulmonary exam normal        Cardiovascular Exercise Tolerance: Good hypertension, On Medications negative cardio ROS Normal cardiovascular exam Rhythm:regular Rate:Normal     Neuro/Psych  Headaches, negative psych ROS   GI/Hepatic Neg liver ROS, GERD  Medicated,  Endo/Other  negative endocrine ROS  Renal/GU negative Renal ROS  negative genitourinary   Musculoskeletal   Abdominal   Peds  Hematology negative hematology ROS (+)   Anesthesia Other Findings Past Medical History: No date: Anxiety No date: Asthma     Comment:  AS A CHILD -NO INHALERS No date: Fibroid No date: GERD (gastroesophageal reflux disease)     Comment:  OCC No date: Headache No date: Hypertension Past Surgical History: No date: NO PAST SURGERIES   Reproductive/Obstetrics negative OB ROS                             Anesthesia Physical Anesthesia Plan  ASA: II  Anesthesia Plan: General ETT   Post-op Pain Management:    Induction:   PONV Risk Score and Plan:   Airway Management Planned:   Additional Equipment:   Intra-op Plan:   Post-operative Plan:   Informed Consent: I have reviewed the patients History and Physical, chart, labs and discussed the procedure including the risks, benefits and alternatives for the proposed anesthesia with the patient or authorized representative who has indicated his/her understanding and acceptance.   Dental Advisory Given  Plan Discussed with: CRNA  Anesthesia Plan Comments:         Anesthesia Quick  Evaluation

## 2018-12-11 NOTE — Discharge Instructions (Signed)
Myomectomy, Care After Refer to this sheet in the next few weeks. These instructions provide you with information on caring for yourself after your procedure. Your health care provider may also give you more specific instructions. Your treatment has been planned according to current medical practices, but problems sometimes occur. Call your health care provider if you have any problems or questions after your procedure. What can I expect after the procedure? After your procedure, it is typical to have the following:  Pain in your abdomen, especially at any incision sites. You will be given pain medicine to control the pain.  Tiredness. This is a normal part of the recovery process. Your energy level will return to normal over the next several weeks.  Constipation.  Vaginal bleeding. This is normal and should stop after 1-2 weeks.  Follow these instructions at home:  Only take over-the-counter or prescription medicines as directed by your health care provider. Avoid aspirin because it can cause bleeding.  Do not douche, use tampons, or have sexual intercourse until given permission by your health care provider.  Remove or change any bandages (dressings) as directed by your health care provider.  Take showers instead of baths as directed by your health care provider.  You will probably be able to go back to your normal routine after a few days. Do not do anything that requires extra effort until your health care provider says it is okay. Do not lift anything heavier than 15 pounds (6.8 kg) until your health care provider approves.  Walk daily but take frequent rest breaks if you tire easily.  Continue to practice deep breathing and coughing. If it hurts to cough, try holding a pillow against your belly as you cough.  If you become constipated, you may: ? Use a mild laxative if your health care provider approves. ? Add more fruit and bran to your diet. ? Drink enough fluids to keep your  urine clear or pale yellow.  Take your temperature twice a day and write it down.  Do not drink alcohol.  Do not drive until your health care provider approves.  Have someone help you at home for 1 week or until you can do your own household activities.  Follow up with your health care provider as directed. Contact a health care provider if:  You have a fever.  You have increasing abdominal pain that is not relieved with medicine.  You have nausea, vomiting, or diarrhea.  You have pain when you urinate, or you have blood in your urine.  You have a rash on your body.  You have pain or redness where your IV access tube was inserted.  You have redness, swelling, or any kind of drainage from an incision. Get help right away if:  You have weakness or lightheadedness.  You have pain, swelling, or redness in your legs.  You have chest pain.  You faint.  You have shortness of breath.  You have heavy vaginal bleeding.  Your incision is opening up. This information is not intended to replace advice given to you by your health care provider. Make sure you discuss any questions you have with your health care provider. Document Released: 05/05/2011 Document Revised: 05/20/2016 Document Reviewed: 07/25/2013 Elsevier Interactive Patient Education  2017 [place]   1) The drugs that you were given will stay in your system until tomorrow so for the next 24 hours you should not:  A) Drive an automobile B) Make  any legal decisions C) Drink any alcoholic beverage   2) You may resume regular meals tomorrow.  Today it is better to start with liquids and gradually work up to solid foods.  You may eat anything you prefer, but it is better to start with liquids, then soup and crackers, and gradually work up to solid foods.   3) Please notify your doctor immediately if you have any unusual bleeding, trouble breathing, redness and  pain at the surgery site, drainage, fever, or pain not relieved by medication.    4) Additional Instructions:        Please contact your physician with any problems or Same Day Surgery at [phone], Monday through Friday 6 am to 4 pm, or [place] [facility] number at [phone].

## 2018-12-12 ENCOUNTER — Encounter: Payer: Self-pay | Admitting: Radiology

## 2018-12-12 ENCOUNTER — Observation Stay: Payer: BLUE CROSS/BLUE SHIELD | Admitting: Anesthesiology

## 2018-12-12 ENCOUNTER — Encounter
Admission: RE | Disposition: A | Discharge status: Home / Self Care | Payer: Self-pay | Source: Home / Self Care | Attending: Obstetrics and Gynecology

## 2018-12-12 ENCOUNTER — Observation Stay: Payer: BLUE CROSS/BLUE SHIELD

## 2018-12-12 ENCOUNTER — Telehealth: Payer: Self-pay | Admitting: Obstetrics and Gynecology

## 2018-12-12 DIAGNOSIS — D251 Intramural leiomyoma of uterus: Secondary | ICD-10-CM | POA: Diagnosis not present

## 2018-12-12 DIAGNOSIS — N83202 Unspecified ovarian cyst, left side: Secondary | ICD-10-CM | POA: Diagnosis not present

## 2018-12-12 DIAGNOSIS — K661 Hemoperitoneum: Secondary | ICD-10-CM | POA: Diagnosis not present

## 2018-12-12 HISTORY — PX: LAPAROTOMY: SHX154

## 2018-12-12 LAB — CBC
HCT: 28.1 % — ABNORMAL LOW (ref 36.0–46.0)
HCT: 22.4 % — ABNORMAL LOW (ref 36.0–46.0)
HCT: 21 % — ABNORMAL LOW (ref 36.0–46.0)
Hemoglobin: 9.3 g/dL — ABNORMAL LOW (ref 12.0–15.0)
Hemoglobin: 7.6 g/dL — ABNORMAL LOW (ref 12.0–15.0)
Hemoglobin: 7.1 g/dL — ABNORMAL LOW (ref 12.0–15.0)
MCH: 30.4 pg (ref 26.0–34.0)
MCH: 30.3 pg (ref 26.0–34.0)
MCH: 30 pg (ref 26.0–34.0)
MCHC: 33.1 g/dL (ref 30.0–36.0)
MCHC: 33.9 g/dL (ref 30.0–36.0)
MCHC: 33.8 g/dL (ref 30.0–36.0)
MCV: 91.8 fL (ref 80.0–100.0)
MCV: 89.2 fL (ref 80.0–100.0)
MCV: 88.6 fL (ref 80.0–100.0)
PLATELETS: 137 10*3/uL — AB (ref 150–400)
Platelets: 137 10*3/uL — ABNORMAL LOW (ref 150–400)
Platelets: 124 10*3/uL — ABNORMAL LOW (ref 150–400)
RBC: 3.06 MIL/uL — ABNORMAL LOW (ref 3.87–5.11)
RBC: 2.51 MIL/uL — ABNORMAL LOW (ref 3.87–5.11)
RBC: 2.37 MIL/uL — ABNORMAL LOW (ref 3.87–5.11)
RDW: 13.9 % (ref 11.5–15.5)
RDW: 14.5 % (ref 11.5–15.5)
RDW: 14.9 % (ref 11.5–15.5)
WBC: 11.9 10*3/uL — ABNORMAL HIGH (ref 4.0–10.5)
WBC: 8.6 10*3/uL (ref 4.0–10.5)
WBC: 8.1 10*3/uL (ref 4.0–10.5)
nRBC: 0 % (ref 0.0–0.2)
nRBC: 0 % (ref 0.0–0.2)
nRBC: 0 % (ref 0.0–0.2)

## 2018-12-12 LAB — CREATININE, SERUM
CREATININE: 2.13 mg/dL — AB (ref 0.44–1.00)
Creatinine, Ser: 1.92 mg/dL — ABNORMAL HIGH (ref 0.44–1.00)
GFR calc Af Amer: 38 mL/min — ABNORMAL LOW (ref >60)
GFR calc Af Amer: 33 mL/min — ABNORMAL LOW (ref >60)
GFR calc non Af Amer: 29 mL/min — ABNORMAL LOW (ref >60)
GFR, EST NON AFRICAN AMERICAN: 33 mL/min — AB (ref >60)

## 2018-12-12 LAB — CK: Total CK: 194 U/L (ref 38–234)

## 2018-12-12 SURGERY — LAPAROTOMY, EXPLORATORY
Anesthesia: General | Site: Abdomen

## 2018-12-12 MED ORDER — GLYCOPYRROLATE 0.2 MG/ML IJ SOLN
INTRAMUSCULAR | Status: DC | PRN
  Administered 2018-12-12: 0.6 mg via INTRAVENOUS

## 2018-12-12 MED ORDER — IOPAMIDOL (ISOVUE-300) INJECTION 61%
75.0000 mL | Freq: Once | INTRAVENOUS | Status: AC | PRN
  Administered 2018-12-12: 100 mL via INTRAVENOUS

## 2018-12-12 MED ORDER — SODIUM CHLORIDE 0.9 % IV SOLN
INTRAVENOUS | Status: DC | PRN
  Administered 2018-12-12: 06:00:00 via INTRAVENOUS

## 2018-12-12 MED ORDER — EPHEDRINE SULFATE 50 MG/ML IJ SOLN
INTRAMUSCULAR | Status: DC | PRN
  Administered 2018-12-12: 10 mg via INTRAVENOUS

## 2018-12-12 MED ORDER — LIDOCAINE 5 % EX PTCH
MEDICATED_PATCH | CUTANEOUS | Status: DC | PRN
  Administered 2018-12-12: 1 via TRANSDERMAL

## 2018-12-12 MED ORDER — NEOSTIGMINE METHYLSULFATE 10 MG/10ML IV SOLN
INTRAVENOUS | Status: AC
  Filled 2018-12-12: qty 1

## 2018-12-12 MED ORDER — ROCURONIUM BROMIDE 100 MG/10ML IV SOLN
INTRAVENOUS | Status: DC | PRN
  Administered 2018-12-12: 20 mg via INTRAVENOUS

## 2018-12-12 MED ORDER — LIDOCAINE 5 % EX PTCH
MEDICATED_PATCH | CUTANEOUS | Status: AC
  Filled 2018-12-12: qty 1

## 2018-12-12 MED ORDER — KETOROLAC TROMETHAMINE 30 MG/ML IJ SOLN
30.0000 mg | Freq: Four times a day (QID) | INTRAMUSCULAR | Status: AC
  Administered 2018-12-12: 30 mg via INTRAVENOUS
  Filled 2018-12-12: qty 1

## 2018-12-12 MED ORDER — ACETAMINOPHEN 500 MG PO TABS
1000.0000 mg | ORAL_TABLET | Freq: Four times a day (QID) | ORAL | Status: DC | PRN
  Administered 2018-12-12 – 2018-12-13 (×4): 1000 mg via ORAL
  Filled 2018-12-12 (×4): qty 2

## 2018-12-12 MED ORDER — OXYCODONE HCL 5 MG PO TABS: 5.0000 mg | ORAL_TABLET | Freq: Four times a day (QID) | ORAL | Status: DC | PRN

## 2018-12-12 MED ORDER — FENTANYL CITRATE (PF) 100 MCG/2ML IJ SOLN
25.0000 ug | INTRAMUSCULAR | Status: DC | PRN
  Administered 2018-12-12 (×4): 25 ug via INTRAVENOUS

## 2018-12-12 MED ORDER — NEOSTIGMINE METHYLSULFATE 10 MG/10ML IV SOLN
INTRAVENOUS | Status: DC | PRN
  Administered 2018-12-12: 3 mg via INTRAVENOUS

## 2018-12-12 MED ORDER — OXYCODONE HCL 5 MG PO TABS
10.0000 mg | ORAL_TABLET | Freq: Four times a day (QID) | ORAL | Status: DC | PRN
  Administered 2018-12-12 – 2018-12-13 (×4): 10 mg via ORAL
  Filled 2018-12-12 (×4): qty 2

## 2018-12-12 MED ORDER — CEFAZOLIN SODIUM-DEXTROSE 2-4 GM/100ML-% IV SOLN
2.0000 g | INTRAVENOUS | Status: AC
  Administered 2018-12-12: 2 g via INTRAVENOUS
  Filled 2018-12-12: qty 100

## 2018-12-12 MED ORDER — LACTATED RINGERS IV SOLN: INTRAVENOUS | Status: DC

## 2018-12-12 MED ORDER — SOD CITRATE-CITRIC ACID 500-334 MG/5ML PO SOLN: 30.0000 mL | ORAL | Status: DC

## 2018-12-12 MED ORDER — PROPOFOL 10 MG/ML IV BOLUS
INTRAVENOUS | Status: AC
  Filled 2018-12-12: qty 20

## 2018-12-12 MED ORDER — ONDANSETRON HCL 4 MG/2ML IJ SOLN: 4.0000 mg | Freq: Once | INTRAMUSCULAR | Status: DC | PRN

## 2018-12-12 MED ORDER — IBUPROFEN 800 MG PO TABS: 800.0000 mg | ORAL_TABLET | Freq: Four times a day (QID) | ORAL | Status: DC

## 2018-12-12 MED ORDER — LIDOCAINE HCL (CARDIAC) PF 100 MG/5ML IV SOSY
PREFILLED_SYRINGE | INTRAVENOUS | Status: DC | PRN
  Administered 2018-12-12: 50 mg via INTRAVENOUS

## 2018-12-12 MED ORDER — GLYCOPYRROLATE 0.2 MG/ML IJ SOLN
INTRAMUSCULAR | Status: AC
  Filled 2018-12-12: qty 3

## 2018-12-12 MED ORDER — ALBUMIN HUMAN 5 % IV SOLN
INTRAVENOUS | Status: AC
  Filled 2018-12-12: qty 250

## 2018-12-12 MED ORDER — MIDAZOLAM HCL 2 MG/2ML IJ SOLN
INTRAMUSCULAR | Status: DC | PRN
  Administered 2018-12-12: 1 mg via INTRAVENOUS

## 2018-12-12 MED ORDER — FENTANYL CITRATE (PF) 100 MCG/2ML IJ SOLN
INTRAMUSCULAR | Status: AC
  Filled 2018-12-12: qty 2

## 2018-12-12 MED ORDER — VASOPRESSIN 20 UNIT/ML IV SOLN
INTRAVENOUS | Status: DC | PRN
  Administered 2018-12-12: 1 [IU] via INTRAVENOUS

## 2018-12-12 MED ORDER — VECURONIUM BROMIDE 10 MG IV SOLR
INTRAVENOUS | Status: AC
  Filled 2018-12-12: qty 10

## 2018-12-12 MED ORDER — CEFAZOLIN SODIUM 1 G IJ SOLR
INTRAMUSCULAR | Status: AC
  Filled 2018-12-12: qty 20

## 2018-12-12 MED ORDER — ALBUMIN HUMAN 5 % IV SOLN
INTRAVENOUS | Status: DC | PRN
  Administered 2018-12-12: 07:00:00 via INTRAVENOUS

## 2018-12-12 MED ORDER — PROPOFOL 10 MG/ML IV BOLUS
INTRAVENOUS | Status: DC | PRN
  Administered 2018-12-12: 100 mg via INTRAVENOUS

## 2018-12-12 MED ORDER — FENTANYL CITRATE (PF) 100 MCG/2ML IJ SOLN
INTRAMUSCULAR | Status: AC
  Administered 2018-12-12: 25 ug via INTRAVENOUS
  Filled 2018-12-12: qty 2

## 2018-12-12 MED ORDER — FENTANYL CITRATE (PF) 100 MCG/2ML IJ SOLN
INTRAMUSCULAR | Status: DC | PRN
  Administered 2018-12-12: 50 ug via INTRAVENOUS
  Administered 2018-12-12 (×2): 25 ug via INTRAVENOUS

## 2018-12-12 MED ORDER — MIDAZOLAM HCL 2 MG/2ML IJ SOLN
INTRAMUSCULAR | Status: AC
  Filled 2018-12-12: qty 2

## 2018-12-12 MED ORDER — PHENYLEPHRINE HCL 10 MG/ML IJ SOLN
INTRAMUSCULAR | Status: DC | PRN
  Administered 2018-12-12 (×3): 100 ug via INTRAVENOUS

## 2018-12-12 SURGICAL SUPPLY — 41 items
BARRIER ADHS 3X4 INTERCEED (GAUZE/BANDAGES/DRESSINGS) ×2 IMPLANT
BINDER ABDOMINAL 12 ML 46-62 (SOFTGOODS) ×2 IMPLANT
BNDG TENSOPLAST 6X5 (GAUZE/BANDAGES/DRESSINGS) ×2 IMPLANT
BULB RESERV EVAC DRAIN JP 100C (MISCELLANEOUS) IMPLANT
CANISTER SUCT 1200ML W/VALVE (MISCELLANEOUS) IMPLANT
CANISTER SUCT 3000ML (MISCELLANEOUS) ×2 IMPLANT
CHLORAPREP W/TINT 26ML (MISCELLANEOUS) ×2 IMPLANT
COVER WAND RF STERILE (DRAPES) IMPLANT
DRAIN CHANNEL JP 15F RND 16 (MISCELLANEOUS) IMPLANT
DRAPE LAPAROTOMY 100X77 ABD (DRAPES) ×2 IMPLANT
DRSG TEGADERM 4X4.75 (GAUZE/BANDAGES/DRESSINGS) ×2 IMPLANT
DRSG TELFA 3X8 NADH (GAUZE/BANDAGES/DRESSINGS) ×2 IMPLANT
GAUZE SPONGE 4X4 12PLY STRL (GAUZE/BANDAGES/DRESSINGS) ×2 IMPLANT
GLOVE BIO SURGEON STRL SZ 6.5 (GLOVE) ×8 IMPLANT
GLOVE BIO SURGEON STRL SZ8 (GLOVE) IMPLANT
GOWN STRL REUS W/ TWL LRG LVL3 (GOWN DISPOSABLE) ×2 IMPLANT
GOWN STRL REUS W/TWL LRG LVL3 (GOWN DISPOSABLE) ×2
HANDLE SUCTION POOLE (INSTRUMENTS) ×1 IMPLANT
HEMOSTAT SURGICEL 2X3 (HEMOSTASIS) ×2 IMPLANT
KIT TURNOVER KIT A (KITS) ×2 IMPLANT
LABEL OR SOLS (LABEL) IMPLANT
NS IRRIG 1000ML POUR BTL (IV SOLUTION) ×2 IMPLANT
PACK BASIN MAJOR ARMC (MISCELLANEOUS) ×2 IMPLANT
SPONGE LAP 18X18 RF (DISPOSABLE) ×4 IMPLANT
STAPLER SKIN PROX 35W (STAPLE) IMPLANT
STRIP CLOSURE SKIN 1/2X4 (GAUZE/BANDAGES/DRESSINGS) ×2 IMPLANT
SUCTION POOLE HANDLE (INSTRUMENTS) ×2
SUT CHROMIC 0 CT 1 (SUTURE) IMPLANT
SUT CHROMIC GUT 1-0 18 CT-1 (SUTURE) IMPLANT
SUT ETHILON 3-0 FS-10 30 BLK (SUTURE) ×4
SUT MAXON ABS #0 GS21 30IN (SUTURE) IMPLANT
SUT MNCRL 4-0 (SUTURE) ×1
SUT MNCRL 4-0 27XMFL (SUTURE) ×1
SUT VIC AB 0 CT1 36 (SUTURE) ×2 IMPLANT
SUT VIC AB 2-0 SH 27 (SUTURE) ×1
SUT VIC AB 2-0 SH 27XBRD (SUTURE) ×1 IMPLANT
SUT VIC AB 3-0 SH 27 (SUTURE) ×1
SUT VIC AB 3-0 SH 27X BRD (SUTURE) ×1 IMPLANT
SUTURE EHLN 3-0 FS-10 30 BLK (SUTURE) ×2 IMPLANT
SUTURE MNCRL 4-0 27XMF (SUTURE) ×1 IMPLANT
TRAY FOLEY MTR SLVR 16FR STAT (SET/KITS/TRAYS/PACK) IMPLANT

## 2018-12-12 NOTE — Anesthesia Procedure Notes (Signed)
Procedure Name: Intubation Date/Time: 12/12/2018 5:21 AM Performed by: Lendon Colonel, CRNA Pre-anesthesia Checklist: Patient identified, Patient being monitored, Timeout performed, Emergency Drugs available and Suction available Patient Re-evaluated:Patient Re-evaluated prior to induction Oxygen Delivery Method: Circle system utilized Preoxygenation: Pre-oxygenation with 100% oxygen Induction Type: IV induction Ventilation: Mask ventilation without difficulty Laryngoscope Size: Mac and 3 Grade View: Grade I Tube type: Oral Tube size: 7.0 mm Number of attempts: 1 Airway Equipment and Method: Stylet Placement Confirmation: ETT inserted through vocal cords under direct vision,  positive ETCO2 and breath sounds checked- equal and bilateral Secured at: 21 cm Tube secured with: Tape Dental Injury: Teeth and Oropharynx as per pre-operative assessment

## 2018-12-12 NOTE — Telephone Encounter (Signed)
Mary, RN, called about an hour ago, from [employer] and asked if she could change the patient's status to INPATIENT being she has had another procedure and is still in the hospital, please advise, thanks.

## 2018-12-12 NOTE — Progress Notes (Signed)
Dr. Marcelline Mates notified of low BP and UO. Rapid Response called on patient at this time.

## 2018-12-12 NOTE — Anesthesia Postprocedure Evaluation (Signed)
Anesthesia Post Note  Patient: Gabriela Thomas  Procedure(s) Performed: EXPLORATORY LAPAROTOMY, evacuation of hemoperitoneum, aspiration of left adnexal cyst (N/A Abdomen)  Patient location during evaluation: PACU Anesthesia Type: General Level of consciousness: awake and alert Pain management: pain level controlled Vital Signs Assessment: post-procedure vital signs reviewed and stable Respiratory status: spontaneous breathing, nonlabored ventilation, respiratory function stable and patient connected to nasal cannula oxygen Cardiovascular status: blood pressure returned to baseline and stable Postop Assessment: no apparent nausea or vomiting Anesthetic complications: no     Last Vitals:  Vitals:   12/12/18 0954 12/12/18 1259  BP: 113/66 111/67  Pulse: 87 97  Resp: 16 16  Temp: 36.7 C 36.8 C  SpO2: 100% 100%    Last Pain:  Vitals:   12/12/18 1410  TempSrc:   PainSc: [name]

## 2018-12-12 NOTE — Op Note (Addendum)
Procedure(s): EXPLORATORY LAPAROTOMY, evacuation of hemoperitoneum, aspiration of left adnexal cyst Procedure Note  MERIDA ALCANTAR female 37 y.o. 12/12/2018  Indications: The patient is a 37 y.o. G30P1001 female who is POD#1 s/p abdominal myomectomy with hypovelemia, hemoperitoneum, left adnexal cyst noted on CT scan  Pre-operative Diagnosis: POD#1 s/p abdominal myomectomy with hypovelemia, hemoperitoneum, left adnexal cyst noted on CT scan, acute kidney injury   Post-operative Diagnosis: POD#1 s/p abdominal myomectomy with hypovelemia, hemoperitoneum, left adnexal cyst noted on CT scan, acute kidney injury  Surgeon: Rubie Maid, MD  Assistants:  None  Anesthesia: General endotracheal anesthesia   Procedure Details: The patient was seen in the Holding Room. The risks, benefits, complications, treatment options, and expected outcomes were discussed with the patient.  The patient concurred with the proposed plan, giving informed consent.  The site of surgery properly noted/marked. The patient was taken to the Operating Room, identified as ANELIS HRIVNAK and the procedure verified as Procedure(s) (LRB): EXPLORATORY LAPAROTOMY, evacuation of hemoperitoneum, aspiration of left adnexal cyst (N/A). A Time Out was held and the above information confirmed.  She was then placed under general anesthesia without difficulty. She was placed in the dorsal lithotomy position, and was prepped and draped in a sterile manner. The patient still had her foley catheter in place from her prior procedure. She had received one unit PRBCs en route to the OR.   The previous Pfannenstiel incision was opened (sutures were cut). The fascial sutures were then removed to open the fascial layer.The rectus muscles and the peritoneum were separated.   The above findings were noted. . Suction was used to irrigate the hemoperitoneum.  The bowel was packed away with moist spongesThe uterus was exteriorized and appeared  relatively hemostatic.  All previous repairs to the uterine incisions appeared intact.  There was slight oozing from one of the fundal myomectomy sites (however may have been from manipulation of the uterus when exteriorizing). A 3-0 Vicryl was used to place a figure-of-eight stitch around the area of oozing. The uterus was returned to the abdomen.  Small amount of pooling was still noted in the gutters so the uterus was exteriorized again.  The remaining hemoperitoneum was again suctioned.  There was noted to be enlargement of the left adnexae, with ~ 4 cm ovarian cyst present (was not present during initial surgery). Cyst was ruptured with serosanguinous fluid expressed.  The area was then cauterized with good hemostasis noted.  A 3-0 Vicryl was then used to close the site of cyst aspiration with a figure-of-eight suture.  Lavage was carried out until clear. Hemostasis was observed.  Surgicele was then placed over the fundus of the uterus for added hemostasis and the adhesion barrier Interceded was placed over the uterus.  The uterus was returned to the abdomen. All packing was removed from the abdomen  The decision was made to place a J.P. drain in the abdomen to monitor further intra-abdominal losses.  There was an small area of the rectus abdominus muscle that had slight oozing on the left side.  A suture of 2-0 Vicryl was used to place a figure-of-eight stitch in the muscle to achieve hemostasis.   The fascia was approximated with a running suture of 0-Vicryl. Lavage was again carried out.  Hemostasis was observed. The skin was approximated with 4-0 Monocryl.  Instrument, sponge, and needle counts were correct prior to abdominal closure and at the conclusion of the case.   Findings: Large hemoperitoneum present.  Uterus with slight oozing  of fundal myomectomy incision.  Adnexal cyst of left side, ~ 4 cm.    Estimated Blood Loss:  1100 ml (hemoperitoneum)         Drains: JP drain placed, to  gravity drainage         Total IV Fluids: 2200 ml of Lactated Ringers, 200 ml of Albumin, and 1 additional units PRBCs given (for a total of 2 units given this admission).         Specimens: None         Implants: Surgicele and Intercede         Complications:  None; patient tolerated the procedure well.         Disposition: PACU - hemodynamically stable.         Condition: stable          Rubie Maid, MD [facility]

## 2018-12-12 NOTE — Significant Event (Signed)
Rapid Response Event Note  Overview: Patient found unresponsive after being given Dilaudid and Phenergan. Blood pressure low with MAP in 40's. On rapid response RN arrival, patient receiving 1L fluid bolus of Lactated Ringers, Nonrebreather mask on, another RN starting new IV. Patient given Narcan. Patient wakes but is disoriented. Patient hooked up to heart monitor. Heart rate between 110-125. Patients extremities are cold. Hard to obtain pulse oximetry reading. Non rebreather mask remains in place. Post narcan and fluids patients blood pressure increased to a MAP greater than 65. O2 reading of 96%, blood pressure 108/92 (99) before leaving patient to Mother/baby RN care.   Event Summary:  12/11/18 at  2115         Gabriela Thomas

## 2018-12-12 NOTE — Progress Notes (Signed)
Post-Operative Day # 1 s/p abdominal myomectomy, POD#0 s/p exploratory laparotomy with hemoperitoneum evacuation (1100 ml) and left cyst drainage.    Subjective: no complaints and tolerating PO.  Has begun ambulating without difficulty.  Denies dizziness, weakness, SOB, fevers, chills, nausea, vomiting. Is now tolerating liquids and crackers.  Plans to eat full diet.   Objective: Temp:  [97 F (36.1 C)-98.8 F (37.1 C)] 98.3 F (36.8 C) (12/17 1259) Pulse Rate:  [77-133] 97 (12/17 1259) Resp:  [13-27] 16 (12/17 1259) BP: (52-142)/(31-92) 111/67 (12/17 1259) SpO2:  [76 %-100 %] 100 % (12/17 1259)   I/O last 3 completed shifts: In: 2956.9 [I.V.:2956.9] Out: 545 [Urine:345; Blood:200] Total I/O In: 3211.3 [P.O.:480; I.V.:2731.3] Out: 2830 [Urine:1400; Drains:330; Blood:1100]    Physical Exam:  General: alert and no distress  Lungs: clear to auscultation bilaterally Heart: regular rate and rhythm, S1, S2 normal, no murmur, click, rub or gallop Abdomen: soft, non-tender; bowel sounds normal; no masses,  no organomegaly Pelvis:Bleeding: minimal,  Incision: dressing clean/dry/intact.  JP drain recently emptied.  Extremities: DVT Evaluation: No evidence of DVT seen on physical exam. Negative Homan's sign. No cords or calf tenderness. No significant calf/ankle edema. SCDs in place.    CBC Latest Ref Rng & Units 12/12/2018 12/12/2018 12/11/2018  WBC 4.0 - 10.5 K/uL 8.6 11.9(H) 21.0(H)  Hemoglobin 12.0 - 15.0 g/dL 7.6(L) 9.3(L) 8.6(L)  Hematocrit 36.0 - 46.0 % 22.4(L) 28.1(L) 27.0(L)  Platelets 150 - 400 K/uL 137(L) 137(L) 252     Lab Results  Component Value Date   CREATININE 2.13 (H) 12/12/2018   CREATININE 1.92 (H) 12/11/2018   CREATININE 0.99 11/29/2018    Assessment/Plan: - Continue routine post-operative care - Continue IVF until tolerating full diet.  - Anemia secondary to hemoperitoneum.  Received 2 units PRBCs around the time of surgery.  Hgb increased from 8.6 to  9.3, however was drawn shortly after the second unit of blood.  Newest Hgb is 7.6.  Unsure if this is due to continued blood loss or equilibration of previous blood loss with being s/p blood transfusion.  Will repeat levels again at 8 pm. Still with 1 unit PRBCs available currently if needed.  - Continue JP drain, monitor output.  Appears to be decreasing since last check. - Continue to encourage ambulation with assistance. - Encourage use of incentive spirometry - AKI, secondary to acute postoperative anemia. I/O's reviewed. Continue to monitor. Patient currently with increasing urinary output.     Rubie Maid, MD [facility]

## 2018-12-12 NOTE — Anesthesia Post-op Follow-up Note (Signed)
Anesthesia QCDR form completed.        

## 2018-12-12 NOTE — Anesthesia Preprocedure Evaluation (Signed)
Anesthesia Evaluation  Patient identified by MRN, date of birth, ID band Patient awake    Reviewed: Allergy & Precautions, NPO status , Patient's Chart, lab work & pertinent test results, reviewed documented beta blocker date and time   Airway Mallampati: III  TM Distance: >3 FB     Dental  (+) Chipped   Pulmonary asthma ,           Cardiovascular hypertension, Pt. on medications      Neuro/Psych  Headaches, Anxiety    GI/Hepatic GERD  Controlled,  Endo/Other    Renal/GU      Musculoskeletal   Abdominal   Peds  Hematology   Anesthesia Other Findings   Reproductive/Obstetrics                             Anesthesia Physical Anesthesia Plan  ASA: III  Anesthesia Plan: General   Post-op Pain Management:    Induction: Intravenous, Rapid sequence and Cricoid pressure planned  PONV Risk Score and Plan:   Airway Management Planned: Oral ETT  Additional Equipment:   Intra-op Plan:   Post-operative Plan:   Informed Consent: I have reviewed the patients History and Physical, chart, labs and discussed the procedure including the risks, benefits and alternatives for the proposed anesthesia with the patient or authorized representative who has indicated his/her understanding and acceptance.     Plan Discussed with: CRNA  Anesthesia Plan Comments:         Anesthesia Quick Evaluation

## 2018-12-12 NOTE — Transfer of Care (Signed)
Immediate Anesthesia Transfer of Care Note  Patient: Gabriela Thomas  Procedure(s) Performed: EXPLORATORY LAPAROTOMY, evacuation of hemoperitoneum, aspiration of left adnexal cyst (N/A Abdomen)  Patient Location: PACU  Anesthesia Type:General  Level of Consciousness: awake, alert , oriented and patient cooperative  Airway & Oxygen Therapy: Patient Spontanous Breathing and Patient connected to face mask oxygen  Post-op Assessment: Report given to RN and Post -op Vital signs reviewed and stable  Post vital signs: Reviewed and stable  Last Vitals:  Vitals Value Taken Time  BP 142/60 12/12/2018  7:18 AM  Temp    Pulse 89 12/12/2018  7:19 AM  Resp 27 12/12/2018  7:19 AM  SpO2 100 % 12/12/2018  7:19 AM  Vitals shown include unvalidated device data.  Last Pain:  Vitals:   12/12/18 0400  TempSrc:   PainSc: Asleep         Complications: No apparent anesthesia complications

## 2018-12-12 NOTE — Progress Notes (Signed)
Notified by nurse at approximately 9:15 pm who noted that patient's blood pressures had dropped to 60s/40s, and that patient was very somnolent.  She had received Phenergan x 1 dose approximately 15 minutes prior, and had also received a dose of Dilaudid within the hour. Also noted that patient's urine output had been somewhat low over the past several hours and her O2 saturation had dropped to 89% . Patient was given an IV bolus of 1L LR, and stat CBC was ordered. She was also given a dose of Narcan (0.4 mg) IM.  Stat CBC revealed H/H of 8.6 and 27.0 (down from  13.1 and 39.0 preoperatively on 11/29/18).  A stat CT scan of the abdomen was then ordered.  Notified at ~ 3:30 a.m. of results of CT scan from Radiologist, noting large hemoperitoneum.     I have been in to assess the patient and her exam is as noted:   S: Patient is tired, but denies any major complaints except for persistent nausea.   O: Temp:  [96.3 F (35.7 C)-98.7 F (37.1 C)] 98.1 F (36.7 C) (12/17 0326) Pulse Rate:  [66-133] 95 (12/17 0326) Resp:  [11-24] 24 (12/17 0326) BP: (52-165)/(31-101) 83/54 (12/17 0326) SpO2:  [76 %-100 %] 100 % (12/17 0311)   Gen App: alert, no acute distress.  CVS: regular rate and rhythm Lungs: CTAB Abdomen: moderately tender to palpation in upper and lower quadrants bilaterally. Mild distention of the abdomen present.  Incision site healing well with no erythema, drainage.  Pelvis: scant bleeding noted.  Extremities: SCDs in place.    Labs:  CBC Latest Ref Rng & Units 12/11/2018 11/29/2018  WBC 4.0 - 10.5 K/uL 21.0(H) 6.3  Hemoglobin 12.0 - 15.0 g/dL 8.6(L) 13.1  Hematocrit 36.0 - 46.0 % 27.0(L) 39.0  Platelets 150 - 400 K/uL 252 209   Lab Results  Component Value Date   CREATININE 1.92 (H) 12/11/2018   Lab Results  Component Value Date   CREATININE 1.92 (H) 12/11/2018   CREATININE 0.99 11/29/2018    Imaging:  CT ABDOMEN PELVIS W CONTRAST CLINICAL DATA:  Abdominal distention and  nausea today. Post abdominal myomectomy today.  EXAM: CT ABDOMEN AND PELVIS WITH CONTRAST  TECHNIQUE: Multidetector CT imaging of the abdomen and pelvis was performed using the standard protocol following bolus administration of intravenous contrast.  CONTRAST:  15mL ISOVUE-300 IOPAMIDOL (ISOVUE-300) INJECTION 61%  COMPARISON:  None.  FINDINGS: Lower chest: Minor bilateral lower lobe atelectasis. No pleural fluid.  Hepatobiliary: 1 cm cyst in the left hepatic lobe. Gallbladder physiologically distended, no calcified stone. No biliary dilatation.  Pancreas: No ductal dilatation or inflammation.  Spleen: Normal in size without focal abnormality.  Adrenals/Urinary Tract: Normal adrenal glands. No hydronephrosis or perinephric edema. Homogeneous renal enhancement. Absent renal excretion on delayed phase imaging. Urinary bladder is decompressed by Foley catheter.  Stomach/Bowel: Bowel evaluation is limited in the absence of enteric contrast and presence of intra-abdominal fluid. No bowel dilatation to suggest obstruction. No gross evidence of focal bowel inflammation. More detailed evaluation is limited. The appendix is normal.  Vascular/Lymphatic: Incidental retroaortic left renal vein. Large amount of high-density material in the abdomen and pelvis consistent with blood, but no visualized active extravasation. Normal caliber abdominal aorta. No gross adenopathy.  Reproductive: Heterogeneous enlarged uterus containing multifocal low-density and foci of air, post myomectomy earlier today. Ovoid 4.7 cm cystic structure in the left adnexa may be ovarian, but is nonspecific.  Other: Large amount of heterogeneous high-density fluid throughout the  abdomen and pelvis consistent with blood. Postsurgical change of the pelvis with air and stranding of the lower anterior abdominal wall and pelvic fat.  Musculoskeletal: Degenerative disc disease at L5-S1. Mild facet arthropathy  in the lower lumbar spine. There are no acute or suspicious osseous abnormalities.  IMPRESSION: 1. Large amount of blood throughout the abdomen and pelvis, hemoperitoneum with blood tracking from the pelvis to the upper abdomen. No visualized active extravasation. Given myomectomy earlier today, uterus is the suspected source. 2. Absent renal excretion on delayed phase imaging consistent with renal dysfunction.  Critical Value/emergent results were called by telephone at the time of interpretation on 12/12/2018 at 3:20 am to Dr. Rubie Maid , who verbally acknowledged these results.  Electronically Signed   By: Keith Rake M.D.   On: 12/12/2018 03:20   Assessment/Plan:  1. S/p myomectomy with hemoperitoneum  - Discussion had with patient regarding labs and recent CT scan.  As patient has intra-abdominal bleeding, discussed need for further evaluation with surgical exploration to identify location of bleed.  The risks of surgery were discussed in detail with the patient including but not limited to: bleeding which may require transfusion or reoperation; infection which may require prolonged hospitalization or re-hospitalization and antibiotic therapy; injury to bowel, bladder, ureters and major vessels or other surrounding organs; need for additional procedures including laparotomy; thromboembolic phenomenon, incisional problems and other postoperative or anesthesia complications.  Patient was told that the likelihood that her condition and symptoms will be treated effectively with this surgical management was very high; the postoperative expectations were also discussed in detail. She has only been able to tolerate ice chips since her surgery, to remain NPO.  Consent form signed.   2. Postoperative anemia  - Secondary to hemoperitoneum. Is currently receiving 1 unit of PRBCs in preparation for surgery. Has 1 additional unit on standby.   3. Acute kidney injury - likely secondary to  hypovolemic status due to internal bleeding.  Given bolus of IVF, 1 unit PRBCs.  Will likely give second unit and continue to follow creatinine.     Rubie Maid, MD [facility]

## 2018-12-12 NOTE — Progress Notes (Signed)
Post-Operative Day # 1 s/p abdominal myomectomy, POD#0 s/p exploratory laparotomy with hemoperitoneum evacuation (1100 ml) and left cyst drainage.    Subjective: no complaints and tolerating PO (currently enjoying full liquid diet). Notes nausea has resolved.   Objective: Temp:  [97 F (36.1 C)-98.8 F (37.1 C)] 98.3 F (36.8 C) (12/17 1259) Pulse Rate:  [77-133] 97 (12/17 1259) Resp:  [13-27] 16 (12/17 1259) BP: (52-142)/(31-92) 111/67 (12/17 1259) SpO2:  [76 %-100 %] 100 % (12/17 1259)    Physical Exam:  General: alert and no distress  Lungs: clear to auscultation bilaterally Heart: regular rate and rhythm, S1, S2 normal, no murmur, click, rub or gallop Abdomen: soft, non-tender; bowel sounds normal; no masses,  no organomegaly Pelvis:Bleeding: minimal,  Incision: dressing clean/dry/intact.  JP drain with ~ 25 ml of sanguinous drainage (previously emptied by nurse with 125 ml).  Extremities: DVT Evaluation: No evidence of DVT seen on physical exam. Negative Homan's sign. No cords or calf tenderness. No significant calf/ankle edema. SCDs in place.    CBC Latest Ref Rng & Units 12/12/2018 12/12/2018 12/11/2018  WBC 4.0 - 10.5 K/uL 8.6 11.9(H) 21.0(H)  Hemoglobin 12.0 - 15.0 g/dL 7.6(L) 9.3(L) 8.6(L)  Hematocrit 36.0 - 46.0 % 22.4(L) 28.1(L) 27.0(L)  Platelets 150 - 400 K/uL 137(L) 137(L) 252     Lab Results  Component Value Date   CREATININE 2.13 (H) 12/12/2018   CREATININE 1.92 (H) 12/11/2018   CREATININE 0.99 11/29/2018      Assessment/Plan: - Continue routine post-operative care - Continue IVF - Anemia secondary to hemoperitoneum.  Received 2 units PRBCs around the time of surgery.  Hgb increased from 8.6 to 9.3, however was drawn shortly after the second unit of blood.  Newest Hgb is 7.6.  Unsure if this is due to continued blood loss or equilibration of previous blood loss with being s/p blood transfusion.  Will repeat levels again at 8 pm. Still with 1 unit PRBCs  available currently if needed.  - Continue JP drain. - Encourage ambulation with assistance (at least up to chair).  - Encourage use of incentive spirometry - AKI, secondary to acute postoperative anemia. I/O's reviewed. Continue to monitor. Patient currently with increasing urinary output.       Rubie Maid, MD [facility]

## 2018-12-13 ENCOUNTER — Other Ambulatory Visit: Payer: Self-pay | Admitting: Obstetrics and Gynecology

## 2018-12-13 DIAGNOSIS — D251 Intramural leiomyoma of uterus: Secondary | ICD-10-CM | POA: Diagnosis not present

## 2018-12-13 LAB — BASIC METABOLIC PANEL
Anion gap: 6 (ref 5–15)
Anion gap: 3 — ABNORMAL LOW (ref 5–15)
BUN: 17 mg/dL (ref 6–20)
BUN: 10 mg/dL (ref 6–20)
CALCIUM: 6.8 mg/dL — AB (ref 8.9–10.3)
CALCIUM: 7.2 mg/dL — AB (ref 8.9–10.3)
CO2: 21 mmol/L — ABNORMAL LOW (ref 22–32)
CO2: 24 mmol/L (ref 22–32)
CREATININE: 1.18 mg/dL — AB (ref 0.44–1.00)
Chloride: 107 mmol/L (ref 98–111)
Chloride: 110 mmol/L (ref 98–111)
Creatinine, Ser: 1.05 mg/dL — ABNORMAL HIGH (ref 0.44–1.00)
GFR calc Af Amer: >60 mL/min (ref >60)
GFR calc Af Amer: >60 mL/min (ref >60)
GFR, EST NON AFRICAN AMERICAN: 59 mL/min — AB (ref >60)
Glucose, Bld: 105 mg/dL — ABNORMAL HIGH (ref 70–99)
Glucose, Bld: 95 mg/dL (ref 70–99)
Potassium: 2.8 mmol/L — ABNORMAL LOW (ref 3.5–5.1)
Potassium: 3.7 mmol/L (ref 3.5–5.1)
Sodium: 134 mmol/L — ABNORMAL LOW (ref 135–145)
Sodium: 137 mmol/L (ref 135–145)

## 2018-12-13 LAB — CBC
HCT: 20.6 % — ABNORMAL LOW (ref 36.0–46.0)
Hemoglobin: 7 g/dL — ABNORMAL LOW (ref 12.0–15.0)
MCH: 30.3 pg (ref 26.0–34.0)
MCHC: 34 g/dL (ref 30.0–36.0)
MCV: 89.2 fL (ref 80.0–100.0)
PLATELETS: 123 10*3/uL — AB (ref 150–400)
RBC: 2.31 MIL/uL — ABNORMAL LOW (ref 3.87–5.11)
RDW: 14.7 % (ref 11.5–15.5)
WBC: 8.2 10*3/uL (ref 4.0–10.5)
nRBC: 0 % (ref 0.0–0.2)

## 2018-12-13 LAB — RPR: RPR Ser Ql: NONREACTIVE

## 2018-12-13 LAB — PREPARE RBC (CROSSMATCH)

## 2018-12-13 LAB — SURGICAL PATHOLOGY

## 2018-12-13 MED ORDER — POTASSIUM CHLORIDE 10 MEQ/100ML IV SOLN
10.0000 meq | INTRAVENOUS | Status: AC
  Administered 2018-12-13 (×4): 10 meq via INTRAVENOUS
  Filled 2018-12-13 (×4): qty 100

## 2018-12-13 MED ORDER — IBUPROFEN 800 MG PO TABS
800.0000 mg | ORAL_TABLET | Freq: Once | ORAL | Status: AC
  Administered 2018-12-13: 800 mg via ORAL
  Filled 2018-12-13: qty 1

## 2018-12-13 MED ORDER — IRON DEXTRAN 50 MG/ML IJ SOLN
100.0000 mg | Freq: Once | INTRAMUSCULAR | Status: AC
  Administered 2018-12-13: 100 mg via INTRAVENOUS
  Filled 2018-12-13: qty 2

## 2018-12-13 MED ORDER — DOCUSATE SODIUM 100 MG PO CAPS: 100.0000 mg | ORAL_CAPSULE | Freq: Two times a day (BID) | ORAL | 2 refills | Status: DC | PRN

## 2018-12-13 MED ORDER — FERROUS SULFATE 325 (65 FE) MG PO TABS: 325.0000 mg | ORAL_TABLET | Freq: Two times a day (BID) | ORAL | 1 refills | Status: DC

## 2018-12-13 MED ORDER — OXYCODONE HCL 5 MG PO TABS: 5.0000 mg | ORAL_TABLET | Freq: Four times a day (QID) | ORAL | 0 refills | Status: DC | PRN

## 2018-12-13 MED ORDER — IBUPROFEN 800 MG PO TABS: 800.0000 mg | ORAL_TABLET | Freq: Three times a day (TID) | ORAL | 1 refills | Status: DC | PRN

## 2018-12-13 MED ORDER — OXYCODONE HCL 5 MG PO TABS: 5.0000 mg | ORAL_TABLET | Freq: Three times a day (TID) | ORAL | 0 refills | Status: DC | PRN

## 2018-12-13 MED ORDER — PSEUDOEPHEDRINE HCL 30 MG PO TABS
30.0000 mg | ORAL_TABLET | Freq: Four times a day (QID) | ORAL | Status: DC | PRN
  Administered 2018-12-13: 30 mg via ORAL
  Filled 2018-12-13 (×2): qty 1

## 2018-12-13 NOTE — Progress Notes (Signed)
Interval progress note:   Notified by nurse that patient was complaining of headache that was not relieved by Roxicodone at ~ 0930.  Also was noting saturations down to 87%.  Denies SOB. Placed on [place] O2 2L.  Patient was noted to be congested during rounds this morning, will prescribe nasal saline spray and Sudafed. Also prescribed Motrin x 1 dose, 800 mg for headache.    Currently patient feeling better, has ambulated several times, headache is relieved.  O2 sats currently 95-97% on 1L of O2.  Continue weaning off oxygen. Aggressively using incentive spirometer. Lungs clear on exam.   Patient still desires to go home today. Notes she is is otherwise feeling fine.  Will do repeat assessment this evening.    Rubie Maid, MD [facility] 12/13/2018 1:21 PM

## 2018-12-13 NOTE — Progress Notes (Signed)
Post-Operative Day # 2 s/p abdominal myomectomy, POD#1 s/p exploratory laparotomy with hemoperitoneum evacuation (1100 ml) and left cyst drainage.    Subjective: no complaints, up ad lib, voiding, tolerating PO and + flatus.  Denies dizziness, weakness, SOB, fevers, chills, nausea, vomiting. Is now tolerating liquids and crackers.  Plans to eat full diet.   Objective: Temp:  [97.9 F (36.6 C)-99.4 F (37.4 C)] 99.4 F (37.4 C) (12/18 0408) Pulse Rate:  [78-111] 111 (12/18 0408) Resp:  [13-18] 16 (12/18 0408) BP: (104-128)/(64-78) 128/76 (12/18 0408) SpO2:  [94 %-100 %] 94 % (12/18 0408)   I/O last 3 completed shifts: In: 5351.3 [P.O.:720; I.V.:4631.3] Out: 4210 [Urine:2675; Drains:435; Blood:1100] No intake/output data recorded.   Physical Exam:  General: alert and no distress  Lungs: clear to auscultation bilaterally Heart: regular rate and rhythm, S1, S2 normal, no murmur, click, rub or gallop Abdomen: soft, non-tender; bowel sounds normal; no masses,  no organomegaly Pelvis:Bleeding: minimal,  Incision: dressing clean/dry/intact.  JP drain recently emptied.  Extremities: DVT Evaluation: No evidence of DVT seen on physical exam. Negative Homan's sign. No cords or calf tenderness. No significant calf/ankle edema. SCDs in place.    CBC Latest Ref Rng & Units 12/13/2018 12/12/2018 12/12/2018  WBC 4.0 - 10.5 K/uL 8.2 8.1 8.6  Hemoglobin 12.0 - 15.0 g/dL 7.0(L) 7.1(L) 7.6(L)  Hematocrit 36.0 - 46.0 % 20.6(L) 21.0(L) 22.4(L)  Platelets 150 - 400 K/uL 123(L) 124(L) 137(L)     Lab Results  Component Value Date   CREATININE 1.18 (H) 12/13/2018   CREATININE 2.13 (H) 12/12/2018   CREATININE 1.92 (H) 12/11/2018    Lab Results  Component Value Date   CREATININE 1.18 (H) 12/13/2018   BUN 17 12/13/2018   NA 134 (L) 12/13/2018   K 2.8 (L) 12/13/2018   CL 107 12/13/2018   CO2 21 (L) 12/13/2018     Assessment/Plan: - Continue routine post-operative care - Tolerating diet -  Anemia secondary to hemoperitoneum.  Received 2 units PRBCs around the time of surgery.  Hgb remaining steady, no significant drop overnight. Still may consider additional unit of PRBCs for improved healing and overall recovery. To discuss with patient.  - Continue JP drain, monitor output.  - Continue to encourage ambulation with assistance. - Encourage use of incentive spirometry - AKI, secondary to acute postoperative severe anemia. Improving.  - Hypokalemia, will supplement with potassium IV runs.   Rubie Maid, MD [facility]

## 2018-12-13 NOTE — Anesthesia Postprocedure Evaluation (Signed)
Anesthesia Post Note  Patient: Gabriela Thomas  Procedure(s) Performed: ABDOMINAL MYOMECTOMY (N/A )  Patient location during evaluation: PACU Anesthesia Type: General Level of consciousness: awake and alert Pain management: pain level controlled Vital Signs Assessment: post-procedure vital signs reviewed and stable Respiratory status: spontaneous breathing, nonlabored ventilation, respiratory function stable and patient connected to nasal cannula oxygen Cardiovascular status: blood pressure returned to baseline and stable Postop Assessment: no apparent nausea or vomiting Anesthetic complications: no     Last Vitals:  Vitals:   12/13/18 1405 12/13/18 1637  BP:  132/81  Pulse: (!) 106 87  Resp: (!) 23 (!) 22  Temp:  36.8 C  SpO2: 96%     Last Pain:  Vitals:   12/13/18 1637  TempSrc: Oral  PainSc:                  Molli Barrows

## 2018-12-13 NOTE — Progress Notes (Signed)
Post-Operative Day # 2 s/p abdominal myomectomy, POD#1 s/p exploratory laparotomy with hemoperitoneum evacuation (1100 ml) and left cyst drainage.    Subjective: No complaints, up ad lib, tolerating PO (regular diet) and + flatus. Notes she is feeling good.   Objective: Temp:  [98.2 F (36.8 C)-99.4 F (37.4 C)] 98.2 F (36.8 C) (12/18 1637) Pulse Rate:  [79-111] 87 (12/18 1637) Resp:  [13-23] 22 (12/18 1637) BP: (113-132)/(68-81) 132/81 (12/18 1637) SpO2:  [79 %-99 %] 96 % (12/18 1405)   I/O last 3 completed shifts: In: 5924.8 [P.O.:720; I.V.:4780.9; IV Piggyback:423.9] Out: 3354 [Urine:4600; Drains:605; Blood:1100] No intake/output data recorded.   Physical Exam:  General: alert and no distress  Lungs: clear to auscultation bilaterally Heart: regular rate and rhythm, S1, S2 normal, no murmur, click, rub or gallop Abdomen: soft, non-tender; bowel sounds normal; no masses,  no organomegaly Pelvis:Bleeding: minimal,  Incision: dressing clean/dry/intact.  JP drain with 30 ml.  Extremities: DVT Evaluation: No evidence of DVT seen on physical exam. Negative Homan's sign. No cords or calf tenderness. No significant calf/ankle edema. SCDs in place.    CBC Latest Ref Rng & Units 12/13/2018 12/12/2018 12/12/2018  WBC 4.0 - 10.5 K/uL 8.2 8.1 8.6  Hemoglobin 12.0 - 15.0 g/dL 7.0(L) 7.1(L) 7.6(L)  Hematocrit 36.0 - 46.0 % 20.6(L) 21.0(L) 22.4(L)  Platelets 150 - 400 K/uL 123(L) 124(L) 137(L)     Lab Results  Component Value Date   CREATININE 1.05 (H) 12/13/2018   CREATININE 1.18 (H) 12/13/2018   CREATININE 2.13 (H) 12/12/2018    Lab Results  Component Value Date   CREATININE 1.05 (H) 12/13/2018   BUN 10 12/13/2018   NA 137 12/13/2018   K 3.7 12/13/2018   CL 110 12/13/2018   CO2 24 12/13/2018     Assessment/Plan: - Continue routine post-operative care - Tolerating diet - Anemia secondary to hemoperitoneum.  S/p 2 units PRBCs this admission. Given dose of IV iron  (Infed).  Will continue to treat with PO iron as outpatient. To discuss with patient.  - Continue JP drain, monitor output. Patient to be given home care instructions.  - AKI, secondary to acute postoperative severe anemia. Mostly resolved.  Continues to have good urine output.  - Hypokalemia, s/p IV potassium.  Levels have improved.  - No longer requiring oxygen supplementation. O2 sats wnl on room air.   - Can d/c home today.  Will have patient f/u in 2 days for drain removal if minimal.      Rubie Maid, MD [facility]

## 2018-12-13 NOTE — Telephone Encounter (Signed)
Please let her know that this is fine.  Although I'm sure that she has probably changed it by now.    Dr. Marcelline Mates

## 2018-12-13 NOTE — Progress Notes (Signed)
Sent patient's prescription initially to [facility] as it was noted to be open until midnight due to patient's late evening discharge, however when patient arrived, she noted it was closed.  Changed prescription and sent to [facility] pharmacy after verification of closing time at midnight.    Rubie Maid, MD [facility]

## 2018-12-13 NOTE — Progress Notes (Signed)
12/13/2018 10:09 PM  BP 133/84   Pulse 87   Temp 97.8 F (36.6 C) (Oral)   Resp 20   LMP 12/07/2018   SpO2 95%  Patient discharged per MD orders. Discharge instructions reviewed with patient and patient verbalized understanding. IV removed per policy. Prescriptions discussed for pickup with patient. Patient and daughter educated on JP drain emptying and information. Both verbalised understanding and comfort handling at home. Discharged via wheelchair escorted by nursing staff.  Almedia Balls, RN

## 2018-12-14 ENCOUNTER — Telehealth: Payer: Self-pay

## 2018-12-14 LAB — TYPE AND SCREEN
ABO/RH(D): B NEG
Antibody Screen: NEGATIVE
Unit division: 0
Unit division: 0
Unit division: 0
Unit division: 0

## 2018-12-14 LAB — BPAM RBC
Blood Product Expiration Date: 201912242359
Blood Product Expiration Date: 202001012359
Blood Product Expiration Date: 202001032359
Blood Product Expiration Date: 202001052359
ISSUE DATE / TIME: 201912170259
ISSUE DATE / TIME: 201912170530
Unit Type and Rh: 1700
Unit Type and Rh: 1700
Unit Type and Rh: 1700
Unit Type and Rh: 9500

## 2018-12-14 LAB — PREPARE RBC (CROSSMATCH)

## 2018-12-14 NOTE — Telephone Encounter (Signed)
Pt called to see how she was doing after surgery and to see if she could come in for a visit with [facility] tomorrow at 1:30pm. Pt stated that she really sore but other than that she was doing okay. Pt stated that she would come in at 1:30pm tomorrow.

## 2018-12-15 ENCOUNTER — Ambulatory Visit (INDEPENDENT_AMBULATORY_CARE_PROVIDER_SITE_OTHER): Payer: BLUE CROSS/BLUE SHIELD | Admitting: Obstetrics and Gynecology

## 2018-12-15 ENCOUNTER — Encounter: Payer: Self-pay | Admitting: Obstetrics and Gynecology

## 2018-12-15 VITALS — BP 181/101 | HR 99 | Ht 64.0 in | Wt 172.1 lb

## 2018-12-15 DIAGNOSIS — K5900 Constipation, unspecified: Secondary | ICD-10-CM

## 2018-12-15 DIAGNOSIS — Z4889 Encounter for other specified surgical aftercare: Secondary | ICD-10-CM

## 2018-12-15 DIAGNOSIS — Z9889 Other specified postprocedural states: Secondary | ICD-10-CM

## 2018-12-15 NOTE — Progress Notes (Signed)
    OBSTETRICS/GYNECOLOGY POST-OPERATIVE CLINIC VISIT  Subjective:     Gabriela Thomas is a 37 y.o. G36P1001 female who presents to the clinic 5 days status post abdominal myomectomy for abnormal uterine bleeding and fibroids, with reoperation on POD#1 for evacuation of large hemoperitoneum. Eating a regular diet without difficulty. Bowel movements are abnormal with constipation noted.  Does report some gas movement but has not had a BM since in the hospital several days ago.  Is taking Colace as prescribed.  Pain is controlled with current analgesics. Medications being used: prescription NSAID's including Ibuprofen and narcotic analgesics including oxycodone/acetaminophen (Percocet, Tylox).  She has been recording the output of her JP drain. Notes it is still draining a pretty decent amount.   The following portions of the patient's history were reviewed and updated as appropriate: allergies, current medications, past family history, past medical history, past social history, past surgical history and problem list.  Review of Systems Pertinent items noted in HPI and remainder of comprehensive ROS otherwise negative.    Objective:    BP (!) 181/101   Pulse 99   Ht 5\' 4"  (1.626 m)   Wt 172 lb 1.6 oz (78.1 kg)   LMP 12/07/2018   BMI 29.54 kg/m  General:  alert and no distress  Abdomen: soft, bowel sounds active, non-tender.  JP drain with serosanguinous drainage, 50 cc.   Incision:   healing well, no drainage, no erythema, no hernia, no seroma, no swelling, no dehiscence, incision well approximated.  Steri-strips visible.      Pathology:  A. 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  - NEGATIVE FOR FEATURES OF MALIGNANCY.   Assessment:    Doing well postoperatively.  Constipation.   Plan:   1. Continue any current medications.  Is already using Colace.  Advised on Miralax or Milk of Magnesia 2. Wound care discussed. Continue to  monitor drainage.   3. Operative findings again reviewed. Pathology report discussed. 4. Activity restrictions: no bending, stooping, or squatting, no lifting more than 10 lbs pounds and pelvic rest x 5 weeks 5. Anticipated return to work: 4 weeks. 6. Follow up: 3 days for drain removal.     Rubie Maid, MD [facility]

## 2018-12-18 ENCOUNTER — Telehealth: Payer: Self-pay

## 2018-12-18 ENCOUNTER — Ambulatory Visit (INDEPENDENT_AMBULATORY_CARE_PROVIDER_SITE_OTHER): Payer: BLUE CROSS/BLUE SHIELD | Admitting: Obstetrics and Gynecology

## 2018-12-18 ENCOUNTER — Encounter: Payer: Self-pay | Admitting: Obstetrics and Gynecology

## 2018-12-18 VITALS — BP 155/101 | HR 99 | Ht 64.0 in | Wt 168.7 lb

## 2018-12-18 DIAGNOSIS — I10 Essential (primary) hypertension: Secondary | ICD-10-CM

## 2018-12-18 DIAGNOSIS — Z9889 Other specified postprocedural states: Secondary | ICD-10-CM

## 2018-12-18 DIAGNOSIS — Z4803 Encounter for change or removal of drains: Secondary | ICD-10-CM

## 2018-12-18 NOTE — Telephone Encounter (Signed)
Pt was called and informed that she needed

## 2018-12-18 NOTE — Progress Notes (Signed)
Pt stated that she is doing a lot better than Friday at her last visit. No concerns.

## 2018-12-18 NOTE — Progress Notes (Signed)
    GYNECOLOGY CLINIC PROGRESS NOTE Subjective:     Gabriela Thomas is a 37 y.o. G34P1001 female who presents today for wound check and JP drain removal. Patient has a surgical incision wound which is located on the lower abdomen. Current symptoms: none, wound healing as expected.  Pain is rated 3/10.   Objective:    BP (!) 155/101   Pulse 99   Ht 5\' 4"  (1.626 m)   Wt 168 lb 11.2 oz (76.5 kg)   LMP 12/07/2018   BMI 28.96 kg/m   Wound:   wound margins intact and healing well.  No signs of infection.  JP drain with faint serosanguinous drainage     Assessment:    Wound check. Cares provided were removal of JP drain.    Uncontrolled HTN  Plan:    1. Discussed appropriate home care of this wound. 2. Strongly encouraged patient to resume home BP meds (notes she did not restart as was instructed to last visit).  3. Follow up: 4 weeks for final post-op check.    Rubie Maid, MD [facility]

## 2018-12-22 NOTE — Discharge Summary (Addendum)
Gynecology Physician Postoperative Discharge Summary  Patient ID: AVIAN GREENAWALT MRN: [id number] DOB/AGE: [birth year] 37 y.o.  Admit Date: 12/11/2018 Discharge Date: 12/13/2018   Preoperative Diagnoses: INTRAMURAL LEIOMYOMA OF UTERUS, BREAKTHROUGH BLEEDING ON OCP'S, ACUTE MIDLINE BACK PAIN, ESSENTIAL HYPERTENSION.    Procedures: Procedure(s) (LRB): ABDOMINAL MYOMECTOMY.  EXPLORATORY LAPAROTOMY, evacuation of hemoperitoneum, aspiration of left adnexal cyst (N/A)  Hospital Course:  Gabriela Thomas is a 37 y.o. G1P1001 admitted for scheduled surgery.  She underwent the procedures as mentioned above, her operation was relatively uncomplicated. For further details about surgery, please refer to the operative report. In the late evening of POD#0, patient had a hypotensive episode with tachycardia and hypoxia. Labs and CT scan revealed that patient had a large hemoperitoneum with moderate to severe anemia present and acute kidney injury (secondary to hypoperfusion). Patient was then taken back to the operating room early morning of POD#1 for exploratory laparotomy and evacuation of the hemoperitoneum. She also received 2 units of PRBC and 1 unit of albumin during this time.  On POD#2, she was noted to have hypokalemia, which was replaced intravenously.  By time of discharge on POD#2, her pain was controlled on oral pain medications; she was ambulating, voiding without difficulty, tolerating regular diet and passing flatus.She was deemed stable for discharge to home.  She was discharged home with a JP drain, and was instructed on home care.   Significant Labs:  Results for MARKITA, STCHARLES (MRN [id number]) as of 12/13/2018 22:51  Ref. Range 11/29/2018 19:08 12/11/2018 21:44 12/12/2018 07:41 12/12/2018 14:46 12/12/2018 20:05 12/13/2018 04:46  WBC Latest Ref Range: 4.0 - 10.5 K/uL 6.3 21.0 (H) 11.9 (H) 8.6 8.1 8.2  RBC Latest Ref Range: 3.87 - 5.11 MIL/uL 4.26 2.78 (L) 3.06 (L) 2.51 (L) 2.37 (L) 2.31  (L)  Hemoglobin Latest Ref Range: 12.0 - 15.0 g/dL 13.1 8.6 (L) 9.3 (L) 7.6 (L) 7.1 (L) 7.0 (L)  HCT Latest Ref Range: 36.0 - 46.0 % 39.0 27.0 (L) 28.1 (L) 22.4 (L) 21.0 (L) 20.6 (L)  MCV Latest Ref Range: 80.0 - 100.0 fL 91.5 97.1 91.8 89.2 88.6 89.2  MCH Latest Ref Range: 26.0 - 34.0 pg 30.8 30.9 30.4 30.3 30.0 30.3  MCHC Latest Ref Range: 30.0 - 36.0 g/dL 33.6 31.9 33.1 33.9 33.8 34.0  RDW Latest Ref Range: 11.5 - 15.5 % 12.4 12.8 13.9 14.5 14.9 14.7  Platelets Latest Ref Range: 150 - 400 K/uL 209 252 137 (L) 137 (L) 124 (L) 123 (L)     Results for ZAIN, BINGMAN (MRN [id number]) as of 12/13/2018 22:51  Ref. Range 12/13/2018 04:46 12/13/2018 17:37  Potassium Latest Ref Range: 3.5 - 5.1 mmol/L 2.8 (L) 3.7    Lab Results  Component Value Date   CREATININE 1.05 (H) 12/13/2018   CREATININE 1.18 (H) 12/13/2018   CREATININE 2.13 (H) 12/12/2018    Discharge Exam: Blood pressure 133/84, pulse 87, temperature 97.8 F (36.6 C), temperature source Oral, resp. rate 20, last menstrual period 12/07/2018, SpO2 95 %. General appearance: alert and no distress  Resp: clear to auscultation bilaterally  Cardio: regular rate and rhythm  GI: soft, non-tender; bowel sounds normal; no masses, no organomegaly.  Incision: C/D/I, no erythema, no drainage noted Pelvic: scant blood on pad  Extremities: extremities normal, atraumatic, no cyanosis or edema and Homans sign is negative, no sign of DVT  Discharged Condition: Stable  Disposition: Home   Allergies as of 12/13/2018   No Known Allergies     Medication List  TAKE these medications   acetaminophen 160 MG/5ML liquid Commonly known as:  TYLENOL Take by mouth every 4 (four) hours as needed for pain.   butalbital-acetaminophen-caffeine 50-325-40 MG tablet Commonly known as:  FIORICET, ESGIC Take 1 tablet by mouth 2 (two) times daily as needed for headache or migraine.   docusate sodium 100 MG capsule Commonly known as:   COLACE Take 1 capsule (100 mg total) by mouth 2 (two) times daily as needed.   EVENING PRIMROSE OIL PO Take 1 capsule by mouth 3 (three) times daily.   ferrous sulfate 325 (65 FE) MG tablet Commonly known as:  FERROUSUL Take 1 tablet (325 mg total) by mouth 2 (two) times daily.   FLUoxetine 10 MG tablet Commonly known as:  PROZAC Take 10 mg by mouth every morning. PT TAKES WITH 20 MG PROZAC TO = 30 MG   FLUoxetine 20 MG tablet Commonly known as:  PROZAC Take 20 mg by mouth every morning. PT TAKES WITH 10 MG PROZAC TO = 30 MG   hydrochlorothiazide 12.5 MG tablet Commonly known as:  HYDRODIURIL Take 25 mg by mouth daily.   ibuprofen 800 MG tablet Commonly known as:  ADVIL,MOTRIN Take 1 tablet (800 mg total) by mouth every 8 (eight) hours as needed. What changed:    medication strength  when to take this  reasons to take this   losartan 50 MG tablet Commonly known as:  COZAAR Take 50 mg by mouth daily.   norethindrone-ethinyl estradiol-iron 1.5-30 MG-MCG tablet Commonly known as:  MICROGESTIN FE,GILDESS FE,LOESTRIN FE Take 1 tablet by mouth daily.   oxyCODONE 5 MG immediate release tablet Commonly known as:  ROXICODONE Take 1 tablet (5 mg total) by mouth every 8 (eight) hours as needed for moderate pain or severe pain (1-2 tabs q 6 hrs).      No future appointments. Follow-up Information    Rubie Maid, MD Follow up in 2 day(s).   Specialties:  Obstetrics and Gynecology, Radiology Why:  Friday at 1:30 pm (ok per [facility]) Contact information: [address] [place] [address] [phone]           Signed:  Rubie Maid, MD [facility]

## 2019-01-09 ENCOUNTER — Encounter: Payer: Self-pay | Admitting: Obstetrics and Gynecology

## 2019-01-09 ENCOUNTER — Telehealth: Payer: Self-pay

## 2019-01-09 ENCOUNTER — Ambulatory Visit: Payer: BLUE CROSS/BLUE SHIELD | Admitting: Obstetrics and Gynecology

## 2019-01-09 VITALS — BP 155/99 | HR 89 | Ht 64.0 in | Wt 172.5 lb

## 2019-01-09 DIAGNOSIS — Z9889 Other specified postprocedural states: Secondary | ICD-10-CM

## 2019-01-09 DIAGNOSIS — D62 Acute posthemorrhagic anemia: Secondary | ICD-10-CM

## 2019-01-09 DIAGNOSIS — R399 Unspecified symptoms and signs involving the genitourinary system: Secondary | ICD-10-CM | POA: Diagnosis not present

## 2019-01-09 DIAGNOSIS — Z09 Encounter for follow-up examination after completed treatment for conditions other than malignant neoplasm: Secondary | ICD-10-CM

## 2019-01-09 LAB — POCT URINALYSIS DIPSTICK
Bilirubin, UA: NEGATIVE
Glucose, UA: NEGATIVE
Ketones, UA: NEGATIVE
Leukocytes, UA: NEGATIVE
NITRITE UA: NEGATIVE
Protein, UA: POSITIVE — AB
RBC UA: NEGATIVE
Spec Grav, UA: >=1.03 — AB (ref 1.010–1.025)
Urobilinogen, UA: 0.2 E.U./dL
pH, UA: 6 (ref 5.0–8.0)

## 2019-01-09 NOTE — Addendum Note (Signed)
Addended by: Edwyna Shell on: 01/09/2019 11:24 AM   Modules accepted: Orders

## 2019-01-09 NOTE — Progress Notes (Signed)
Pt stated that she is doing a lot better. Pt stated that she is doing well no problems.

## 2019-01-09 NOTE — Telephone Encounter (Signed)
Pt was called and informed that her urine sample at the office was good and show no signs of an UTI.

## 2019-01-09 NOTE — Progress Notes (Addendum)
    OBSTETRICS/GYNECOLOGY POST-OPERATIVE CLINIC VISIT  Subjective:     Gabriela Thomas is a 38 y.o. female who presents to the clinic 4 weeks status post abdominal myomectomy, and exploratory laparotomy for abnormal uterine bleeding and fibroids. Eating a regular diet without difficulty. Bowel movements are normal. The patient is not having any pain.   The following portions of the patient's history were reviewed and updated as appropriate: allergies, current medications, past family history, past medical history, past social history, past surgical history and problem list.  Review of Systems A comprehensive review of systems was negative except for: Genitourinary: positive for irritation with voiding, but denies itching, burning.     Objective:    BP (!) 155/99   Pulse 89   Ht 5\' 4"  (1.626 m)   Wt 172 lb 8 oz (78.2 kg)   LMP 12/11/2018   BMI 29.61 kg/m  General:  alert and no distress  Abdomen: soft, bowel sounds active, non-tender  Incision:   healing well, no drainage, no erythema, no hernia, no seroma, no swelling, no dehiscence, incision well approximated    Pathology:  A. 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  - NEGATIVE FOR FEATURES OF MALIGNANCY.    Labs:  Results for orders placed or performed in visit on 01/09/19  POCT urinalysis dipstick  Result Value Ref Range   Color, UA yellow    Clarity, UA clear    Glucose, UA Negative Negative   Bilirubin, UA neg    Ketones, UA neg    Spec Grav, UA >=1.030 (A) 1.010 - 1.025   Blood, UA neg    pH, UA 6.0 5.0 - 8.0   Protein, UA Positive (A) Negative   Urobilinogen, UA 0.2 0.2 or 1.0 E.U./dL   Nitrite, UA neg    Leukocytes, UA Negative Negative   Appearance yellow    Odor      Assessment:    Doing well postoperatively. Postoperative follow-up S/P myomectomy H/o anemia secondary to acute surgical blood loss, requiring blood transfusion Voiding  irritation  Plan:   1. Will check Hgb for h/o anemia.  2. Wound care discussed. 3. Operative findings again reviewed. Pathology report discussed. 4. Activity restrictions: none 5. Anticipated return to work: 1-2 weeks. 6. UA today negative for UTI.  7. Follow up: 5 months for annual exam    Rubie Maid, MD [facility]

## 2019-01-10 LAB — HEMOGLOBIN AND HEMATOCRIT, BLOOD
HEMATOCRIT: 27.9 % — AB (ref 34.0–46.6)
Hemoglobin: 9 g/dL — ABNORMAL LOW (ref 11.1–15.9)

## 2019-06-06 ENCOUNTER — Encounter: Payer: BLUE CROSS/BLUE SHIELD | Admitting: Obstetrics and Gynecology

## 2019-06-13 ENCOUNTER — Encounter: Payer: BLUE CROSS/BLUE SHIELD | Admitting: Obstetrics and Gynecology

## 2019-09-10 ENCOUNTER — Other Ambulatory Visit: Payer: Self-pay | Admitting: Obstetrics and Gynecology

## 2019-09-10 NOTE — Progress Notes (Signed)
Pt present for annual exam. Pt stated that she would like to discuss birth control.  Pt declines flu vaccine.

## 2019-09-10 NOTE — Patient Instructions (Signed)
Preventive Care 20-38 Years Old, Female Preventive care refers to visits with your health care provider and lifestyle choices that can promote health and wellness. This includes:  A yearly physical exam. This may also be called an annual well check.  Regular dental visits and eye exams.  Immunizations.  Screening for certain conditions.  Healthy lifestyle choices, such as eating a healthy diet, getting regular exercise, not using drugs or products that contain nicotine and tobacco, and limiting alcohol use. What can I expect for my preventive care visit? Physical exam Your health care provider will check your:  Height and weight. This may be used to calculate body mass index (BMI), which tells if you are at a healthy weight.  Heart rate and blood pressure.  Skin for abnormal spots. Counseling Your health care provider may ask you questions about your:  Alcohol, tobacco, and drug use.  Emotional well-being.  Home and relationship well-being.  Sexual activity.  Eating habits.  Work and work Statistician.  Method of birth control.  Menstrual cycle.  Pregnancy history. What immunizations do I need?  Influenza (flu) vaccine  This is recommended every year. Tetanus, diphtheria, and pertussis (Tdap) vaccine  You may need a Td booster every 10 years. Varicella (chickenpox) vaccine  You may need this if you have not been vaccinated. Human papillomavirus (HPV) vaccine  If recommended by your health care provider, you may need three doses over 6 months. Measles, mumps, and rubella (MMR) vaccine  You may need at least one dose of MMR. You may also need a second dose. Meningococcal conjugate (MenACWY) vaccine  One dose is recommended if you are age 75-21 years and a first-year college student living in a residence hall, or if you have one of several medical conditions. You may also need additional booster doses. Pneumococcal conjugate (PCV13) vaccine  You may need  this if you have certain conditions and were not previously vaccinated. Pneumococcal polysaccharide (PPSV23) vaccine  You may need one or two doses if you smoke cigarettes or if you have certain conditions. Hepatitis A vaccine  You may need this if you have certain conditions or if you travel or work in places where you may be exposed to hepatitis A. Hepatitis B vaccine  You may need this if you have certain conditions or if you travel or work in places where you may be exposed to hepatitis B. Haemophilus influenzae type b (Hib) vaccine  You may need this if you have certain conditions. You may receive vaccines as individual doses or as more than one vaccine together in one shot (combination vaccines). Talk with your health care provider about the risks and benefits of combination vaccines. What tests do I need?  Blood tests  Lipid and cholesterol levels. These may be checked every 5 years starting at age 33.  Hepatitis C test.  Hepatitis B test. Screening  Diabetes screening. This is done by checking your blood sugar (glucose) after you have not eaten for a while (fasting).  Sexually transmitted disease (STD) testing.  BRCA-related cancer screening. This may be done if you have a family history of breast, ovarian, tubal, or peritoneal cancers.  Pelvic exam and Pap test. This may be done every 3 years starting at age 76. Starting at age 102, this may be done every 5 years if you have a Pap test in combination with an HPV test. Talk with your health care provider about your test results, treatment options, and if necessary, the need for more tests.  Follow these instructions at home: °Eating and drinking ° °· Eat a diet that includes fresh fruits and vegetables, whole grains, lean protein, and low-fat dairy. °· Take vitamin and mineral supplements as recommended by your health care provider. °· Do not drink alcohol if: °? Your health care provider tells you not to drink. °? You are  pregnant, may be pregnant, or are planning to become pregnant. °· If you drink alcohol: °? Limit how much you have to 0-1 drink a day. °? Be aware of how much alcohol is in your drink. In the [place] one drink equals one 12 oz bottle of beer (355 mL), one 5 oz glass of wine (148 mL), or one 1½ oz glass of hard liquor (44 mL). °Lifestyle °· Take daily care of your teeth and gums. °· Stay active. Exercise for at least 30 minutes on 5 or more days each week. °· Do not use any products that contain nicotine or tobacco, such as cigarettes, e-cigarettes, and chewing tobacco. If you need help quitting, ask your health care provider. °· If you are sexually active, practice safe sex. Use a condom or other form of birth control (contraception) in order to prevent pregnancy and STIs (sexually transmitted infections). If you plan to become pregnant, see your health care provider for a preconception visit. °What's next? °· Visit your health care provider once a year for a well check visit. °· Ask your health care provider how often you should have your eyes and teeth checked. °· Stay up to date on all vaccines. °This information is not intended to replace advice given to you by your health care provider. Make sure you discuss any questions you have with your health care provider. °Document Released: 02/08/2002 Document Revised: 08/24/2018 Document Reviewed: 08/24/2018 °Elsevier Patient Education © 2020 [employer] °Breast Self-Awareness °Breast self-awareness is knowing how your breasts look and feel. Doing breast self-awareness is important. It allows you to catch a breast problem early while it is still small and can be treated. All women should do breast self-awareness, including women who have had breast implants. Tell your doctor if you notice a change in your breasts. °What you need: °· A mirror. °· A well-lit room. °How to do a breast self-exam °A breast self-exam is one way to learn what is normal for your breasts and to  check for changes. To do a breast self-exam: °Look for changes ° °1. Take off all the clothes above your waist. °2. Stand in front of a mirror in a room with good lighting. °3. Put your hands on your hips. °4. Push your hands down. °5. Look at your breasts and nipples in the mirror to see if one breast or nipple looks different from the other. Check to see if: °? The shape of one breast is different. °? The size of one breast is different. °? There are wrinkles, dips, and bumps in one breast and not the other. °6. Look at each breast for changes in the skin, such as: °? Redness. °? Scaly areas. °7. Look for changes in your nipples, such as: °? Liquid around the nipples. °? Bleeding. °? Dimpling. °? Redness. °? A change in where the nipples are. °Feel for changes ° °1. Lie on your back on the floor. °2. Feel each breast. To do this, follow these steps: °? Pick a breast to feel. °? Put the arm closest to that breast above your head. °? Use your other arm to feel the nipple area of   your breast. Feel the area with the pads of your three middle fingers by making small circles with your fingers. For the first circle, press lightly. For the second circle, press harder. For the third circle, press even harder. ? Keep making circles with your fingers at the different pressures as you move down your breast. Stop when you feel your ribs. ? Move your fingers a little toward the center of your body. ? Start making circles with your fingers again, this time going up until you reach your collarbone. ? Keep making up-and-down circles until you reach your armpit. Remember to keep using the three pressures. ? Feel the other breast in the same way. 3. Sit or stand in the tub or shower. 4. With soapy water on your skin, feel each breast the same way you did in step 2 when you were lying on the floor. Write down what you find Writing down what you find can help you remember what to tell your doctor. Write down:  What is  normal for each breast.  Any changes you find in each breast, including: ? The kind of changes you find. ? Whether you have pain. ? Size and location of any lumps.  When you last had your menstrual period. General tips  Check your breasts every month.  If you are breastfeeding, the best time to check your breasts is after you feed your baby or after you use a breast pump.  If you get menstrual periods, the best time to check your breasts is 5-7 days after your menstrual period is over.  With time, you will become comfortable with the self-exam, and you will begin to know if there are changes in your breasts. Contact a doctor if you:  See a change in the shape or size of your breasts or nipples.  See a change in the skin of your breast or nipples, such as red or scaly skin.  Have fluid coming from your nipples that is not normal.  Find a lump or thick area that was not there before.  Have pain in your breasts.  Have any concerns about your breast health. Summary  Breast self-awareness includes looking for changes in your breasts, as well as feeling for changes within your breasts.  Breast self-awareness should be done in front of a mirror in a well-lit room.  You should check your breasts every month. If you get menstrual periods, the best time to check your breasts is 5-7 days after your menstrual period is over.  Let your doctor know of any changes you see in your breasts, including changes in size, changes on the skin, pain or tenderness, or fluid from your nipples that is not normal. This information is not intended to replace advice given to you by your health care provider. Make sure you discuss any questions you have with your health care provider. Document Released: 05/31/2008 Document Revised: 08/01/2018 Document Reviewed: 08/01/2018 Elsevier Patient Education  2020 [employer].

## 2019-09-11 ENCOUNTER — Encounter: Payer: Self-pay | Admitting: Obstetrics and Gynecology

## 2019-09-11 ENCOUNTER — Other Ambulatory Visit (HOSPITAL_COMMUNITY)
Admission: RE | Admit: 2019-09-11 | Discharge: 2019-09-11 | Disposition: A | Discharge status: Home / Self Care | Payer: BC Managed Care – PPO | Source: Ambulatory Visit | Attending: Obstetrics and Gynecology | Admitting: Obstetrics and Gynecology

## 2019-09-11 ENCOUNTER — Ambulatory Visit (INDEPENDENT_AMBULATORY_CARE_PROVIDER_SITE_OTHER): Payer: BC Managed Care – PPO | Admitting: Obstetrics and Gynecology

## 2019-09-11 ENCOUNTER — Other Ambulatory Visit: Payer: Self-pay

## 2019-09-11 VITALS — BP 167/110 | HR 89 | Ht 64.0 in | Wt 163.7 lb

## 2019-09-11 DIAGNOSIS — N62 Hypertrophy of breast: Secondary | ICD-10-CM

## 2019-09-11 DIAGNOSIS — N921 Excessive and frequent menstruation with irregular cycle: Secondary | ICD-10-CM

## 2019-09-11 DIAGNOSIS — Z86018 Personal history of other benign neoplasm: Secondary | ICD-10-CM

## 2019-09-11 DIAGNOSIS — Z01419 Encounter for gynecological examination (general) (routine) without abnormal findings: Secondary | ICD-10-CM | POA: Diagnosis not present

## 2019-09-11 DIAGNOSIS — Z124 Encounter for screening for malignant neoplasm of cervix: Secondary | ICD-10-CM

## 2019-09-11 DIAGNOSIS — E663 Overweight: Secondary | ICD-10-CM

## 2019-09-11 DIAGNOSIS — Z131 Encounter for screening for diabetes mellitus: Secondary | ICD-10-CM

## 2019-09-11 DIAGNOSIS — Z13220 Encounter for screening for lipoid disorders: Secondary | ICD-10-CM

## 2019-09-11 DIAGNOSIS — N6012 Diffuse cystic mastopathy of left breast: Secondary | ICD-10-CM

## 2019-09-11 DIAGNOSIS — N6011 Diffuse cystic mastopathy of right breast: Secondary | ICD-10-CM

## 2019-09-11 MED ORDER — NORGESTIMATE-ETH ESTRADIOL 0.25-35 MG-MCG PO TABS: 1.0000 | ORAL_TABLET | Freq: Every day | ORAL | 11 refills | Status: DC

## 2019-09-11 MED ORDER — SLYND 4 MG PO TABS: 1.0000 | ORAL_TABLET | Freq: Every day | ORAL | 3 refills | Status: DC

## 2019-09-11 NOTE — Progress Notes (Signed)
GYNECOLOGY ANNUAL PHYSICAL EXAM PROGRESS NOTE  Subjective:    Gabriela Thomas is a 38 y.o. G66P1001 female who presents for an annual exam.  The patient is sexually active. The patient wears seatbelts: yes. The patient participates in regular exercise: no. Has the patient ever been transfused or tattooed?: no. The patient reports that there is not domestic violence in her life.   The patient has the following complaints/concerns today: 1. Notes breakthrough spotting ~ 1 week before her menstrual cycle starts, has been ongoing for the past 2 months.  Denies spotting with sexual activity, currently with same partner, denies risk of STI exposure.  2. Patient notes that her blood pressure medication was changed ~ 1 month ago due to uncontrolled levels.   Gynecologic History Patient's last menstrual period was 08/28/2019. Menarche age: 19 Contraception: OCP (estrogen/progesterone) History of STI's: Denies Last Pap: patient cannot recall. Results were: normal.  Denies h/o abnormal pap smears. Last mammogram: ~06/2019. Results were: normal. Performed for "nodules in breast".  Noted dense breast tissue.    OB History  Gravida Para Term Preterm AB Living  1 1 1  0 0 1  SAB TAB Ectopic Multiple Live Births  0 0 0 0 1    # Outcome Date GA Lbr Len/2nd Weight Sex Delivery Anes PTL Lv  1 Term 1999 [redacted]w[redacted]d   F Vag-Spont   LIV    Past Medical History:  Diagnosis Date   Anxiety    Asthma    AS A CHILD -NO INHALERS   Fibroid    GERD (gastroesophageal reflux disease)    OCC   Headache    Hypertension     Past Surgical History:  Procedure Laterality Date   LAPAROTOMY N/A 12/12/2018   Procedure: EXPLORATORY LAPAROTOMY, evacuation of hemoperitoneum, aspiration of left adnexal cyst;  Surgeon: Rubie Maid, MD;  Location: [facility];  Service: Gynecology;  Laterality: N/A;   MYOMECTOMY N/A 12/11/2018   Procedure: ABDOMINAL MYOMECTOMY;  Surgeon: Rubie Maid, MD;  Location: [facility];   Service: Gynecology;  Laterality: N/A;   NO PAST SURGERIES      Family History  Problem Relation Age of Onset   Hypertension Mother    Diabetes Mother    Congestive Heart Failure Mother    Hypertension Maternal Aunt    Hypertension Maternal Uncle    Hypertension Maternal Grandmother    Hypertension Maternal Grandfather     Social History   Socioeconomic History   Marital status: Single    Spouse name: Not on file   Number of children: Not on file   Years of education: Not on file   Highest education level: Not on file  Occupational History   Not on file  Social Needs   Financial resource strain: Not on file   Food insecurity    Worry: Not on file    Inability: Not on file   Transportation needs    Medical: Not on file    Non-medical: Not on file  Tobacco Use   Smoking status: Never Smoker   Smokeless tobacco: Never Used  Substance and Sexual Activity   Alcohol use: Yes    Alcohol/week: 7.0 standard drinks    Types: 7 Glasses of wine per week   Drug use: No   Sexual activity: Yes    Birth control/protection: Pill  Lifestyle   Physical activity    Days per week: Not on file    Minutes per session: Not on file   Stress: Not  on file  Relationships   Social connections    Talks on phone: Not on file    Gets together: Not on file    Attends religious service: Not on file    Active member of club or organization: Not on file    Attends meetings of clubs or organizations: Not on file    Relationship status: Not on file   Intimate partner violence    Fear of current or ex partner: Not on file    Emotionally abused: Not on file    Physically abused: Not on file    Forced sexual activity: Not on file  Other Topics Concern   Not on file  Social History Narrative   Not on file    Current Outpatient Medications on File Prior to Visit  Medication Sig Dispense Refill   butalbital-acetaminophen-caffeine (FIORICET, ESGIC) 50-325-40 MG  tablet Take 1 tablet by mouth 2 (two) times daily as needed for headache or migraine.      hydrochlorothiazide (MICROZIDE) 12.5 MG capsule Take 12.5 mg by mouth daily.     metoprolol tartrate (LOPRESSOR) 25 MG tablet Take 25 mg by mouth daily.     No current facility-administered medications on file prior to visit.     No Known Allergies    Review of Systems Constitutional: negative for chills, fatigue, fevers and sweats Eyes: negative for irritation, redness and visual disturbance Ears, nose, mouth, throat, and face: negative for hearing loss, nasal congestion, snoring and tinnitus Respiratory: negative for asthma, cough, sputum Cardiovascular: negative for chest pain, dyspnea, exertional chest pressure/discomfort, irregular heart beat, palpitations and syncope Gastrointestinal: negative for abdominal pain, change in bowel habits, nausea and vomiting Genitourinary: positive for abnormal menstrual periods (see HPI).  Negative for genital lesions, sexual problems and vaginal discharge, dysuria and urinary incontinence Integument/breast: negative for breast lump, breast tenderness and nipple discharge Hematologic/lymphatic: negative for bleeding and easy bruising Musculoskeletal: positive for low back pain and shoulder pain.  Negative for muscle weakness Neurological: negative for dizziness, headaches, vertigo and weakness Endocrine: negative for diabetic symptoms including polydipsia, polyuria and skin dryness Allergic/Immunologic: negative for hay fever and urticaria      Objective:  Blood pressure (!) 167/110, pulse 89, height 5\' 4"  (1.626 m), weight 163 lb 11.2 oz (74.3 kg), last menstrual period 08/28/2019. Body mass index is 28.1 kg/m.  Repeat BP at end of visit 163/119.    General Appearance:    Alert, cooperative, no distress, appears stated age, overweight  Head:    Normocephalic, without obvious abnormality, atraumatic  Eyes:    PERRL, conjunctiva/corneas clear, EOM's  intact, both eyes  Ears:    Normal external ear canals, both ears  Nose:   Nares normal, septum midline, mucosa normal, no drainage or sinus tenderness  Throat:   Lips, mucosa, and tongue normal; teeth and gums normal  Neck:   Supple, symmetrical, trachea midline, no adenopathy; thyroid: no enlargement/tenderness/nodules; no carotid bruit or JVD  Back:     Symmetric, no curvature, ROM normal, no CVA tenderness  Lungs:     Clear to auscultation bilaterally, respirations unlabored  Chest Wall:    No tenderness or deformity   Heart:    Regular rate and rhythm, S1 and S2 normal, no murmur, rub or gallop  Breast Exam:    No tenderness, fibrocystic breast changes present bilaterally. No nipple abnormality  Abdomen:     Soft, non-tender, bowel sounds active all four quadrants, no masses, no organomegaly.  Keloid of Pfannenstiel scar  present.   Genitalia:    Pelvic:external genitalia normal, vagina without lesions, discharge, or tenderness, rectovaginal septum  normal. Cervix normal in appearance, no cervical motion tenderness, no adnexal masses or tenderness.  Uterus normal size, shape, mobile, regular contours, nontender.  Rectal:    Normal external sphincter.  No hemorrhoids appreciated. Internal exam not done.   Extremities:   Extremities normal, atraumatic, no cyanosis or edema  Pulses:   2+ and symmetric all extremities  Skin:   Skin color, texture, turgor normal, no rashes or lesions  Lymph nodes:   Cervical, supraclavicular, and axillary nodes normal  Neurologic:   CNII-XII intact, normal strength, sensation and reflexes throughout   .  Labs:  Lab Results  Component Value Date   WBC 8.2 12/13/2018   HGB 9.0 (L) 01/09/2019   HCT 27.9 (L) 01/09/2019   MCV 89.2 12/13/2018   PLT 123 (L) 12/13/2018    Lab Results  Component Value Date   CREATININE 1.05 (H) 12/13/2018   BUN 10 12/13/2018   NA 137 12/13/2018   K 3.7 12/13/2018   CL 110 12/13/2018   CO2 24 12/13/2018    Lab Results    Component Value Date   ALT 17 11/29/2018   AST 25 11/29/2018   ALKPHOS 47 11/29/2018   BILITOT 0.3 11/29/2018    No results found for: TSH   Assessment:   Encounter for well woman exam with routine gynecological exam  Breakthrough bleeding on birth control pills Cervical cancer screening  Lipid screening Screening for diabetes mellitus Overweight Fibrocystic breast changes Large breasts  Plan:     Blood tests: CBC with diff, Comprehensive metabolic panel, TSH and Lipid panel, HgbA1c, and Vitamin D level. Breast self exam technique reviewed and patient encouraged to perform self-exam monthly. Contraception: currenlty on combined OCPs with breakthrough bleeding.  Also with history of fibroid uterus, s/p myomectomy in June. Discussed changing to different OCPs or other form of contraception. Recommend progesterone-only method in light of patient's uncontrolled BPs on meds and age > 72 to decrease risk of thromboembolic events. Given sample of Slynd, will also send in prescription.  Discussed healthy lifestyle modifications. Pap smear performed today.   Fibrocystic breast changes, patient notes recent mammogram done with negative results, not in Epic. Will need to request records. Recommend repeat beginning at age 49.  Uncontrolled HTN, patient notes recent BP med change by PCP.  Still uncontrolled.  Encourage to follow up.  Overweight, patient notes she is working to lose weight (modifying diet).  Patient noting back pain and shoulder pain, thinks it may be due to her breasts and bras.  Has been considering breast reduction but is worried about having another surgery. Discussed "minimizer" bras/supportive bras as an option for now.  When desired, can refer to General Surgery for consultation.  Declines flu vaccine.  Return to clinic for any scheduled appointments or for any gynecologic concerns as needed.     Rubie Maid, MD [facility]

## 2019-09-18 ENCOUNTER — Telehealth: Payer: Self-pay

## 2019-09-18 NOTE — Telephone Encounter (Signed)
Spoke with pharmacy concerning pt's medication Slynd not being covered. Gave pharmacy a discount card to see if would cover the medication. Medication was covered by discount card.

## 2019-09-20 LAB — CYTOLOGY - PAP
Diagnosis: NEGATIVE
High risk HPV: NEGATIVE
Molecular Disclaimer: 56
Molecular Disclaimer: DETECTED
Molecular Disclaimer: NORMAL

## 2019-12-05 ENCOUNTER — Encounter: Payer: BC Managed Care – PPO | Admitting: Obstetrics and Gynecology

## 2020-05-07 ENCOUNTER — Other Ambulatory Visit: Payer: Self-pay

## 2020-05-07 ENCOUNTER — Encounter: Payer: Self-pay | Admitting: Obstetrics and Gynecology

## 2020-05-07 ENCOUNTER — Ambulatory Visit: Payer: BC Managed Care – PPO | Admitting: Obstetrics and Gynecology

## 2020-05-07 VITALS — BP 143/95 | HR 80 | Ht 64.0 in | Wt 173.5 lb

## 2020-05-07 DIAGNOSIS — N62 Hypertrophy of breast: Secondary | ICD-10-CM | POA: Diagnosis not present

## 2020-05-07 DIAGNOSIS — Z3041 Encounter for surveillance of contraceptive pills: Secondary | ICD-10-CM | POA: Diagnosis not present

## 2020-05-07 NOTE — Progress Notes (Signed)
Pt present today for consultation for breast reduction. Pt that she stopped taking the birth control Slynd do to having irregular cycles.

## 2020-05-07 NOTE — Progress Notes (Signed)
    GYNECOLOGY PROGRESS NOTE  Subjective:    Patient ID: Gabriela Thomas, female    DOB: 1981-06-23, 39 y.o.   MRN: [id number]  HPI  Patient is a 39 y.o. G64P1001 female who presents for the following complaints:  1. Desires to discuss referral for breast surgery (reduction). Has lots of back pain, and using minimizer bras and back support brace.  She notes that she has deepening markings/indentions in her shoulders. Increased moisture under breasts. Wears bra size 38G. Bruising in axillary region due to bra pressure.  Has been seeing PCP for her back pain.    She also reports that she stopped Slynd 3 months ago due to irregular cycles (cycles 2-3 weeks ago). Notes cycles are now back to being regular. Desires to discuss other options for birth control. She is right now using condoms.    The following portions of the patient's history were reviewed and updated as appropriate: allergies, current medications, past family history, past medical history, past social history, past surgical history and problem list.  Review of Systems Pertinent items noted in HPI and remainder of comprehensive ROS otherwise negative.   Objective:   Blood pressure (!) 143/95, pulse 80, height 5\' 4"  (1.626 m), weight 173 lb 8 oz (78.7 kg), last menstrual period 04/19/2020. General appearance: alert and no distress Breasts: Large breasts bilaterally. No tenderness, fibrocystic breast changes present bilaterally. No nipple abnormality.    Assessment:   Large breasts Contraception management  Plan:   - Referral placed to Plastic surgery.  - Education given regarding options for contraception, including barrier methods, injectable contraception, IUD placement, oral contraceptives, NuvaRing. She desires to use pills. Given sample of Lo-Loestrin as BPs have improved, advised patient to check BPs at home, if becoming more elevated, will need to revisit progesterone-only options.  - Return to clinic for any  scheduled appointments or for any gynecologic concerns as needed.  Is due for annual exam, will schedule in the first 1-2 months.    A total of 15 minutes were spent face-to-face with the patient during this encounter and over half of that time dealt with counseling and coordination of care.   Rubie Maid, MD [facility]

## 2020-05-07 NOTE — Patient Instructions (Signed)
Breast Reduction Breast reduction, also called reduction mammoplasty, is surgery to reduce the size of the breasts by removing fat, tissue, and excess skin. The goal of this procedure is to help relieve problems that are caused or made worse by large breasts, such as:  Poor posture.  Long-term (chronic) back and neck pain.  Difficulty exercising.  A rash on the skin under the breasts.  Breast pain.  Grooves in the shoulder from bra straps.  Difficulty with hygiene.  Psychological distress caused by large breasts. Before the procedure, you and your surgeon will decide on a plan for the new size of your breasts. Tell a health care provider about:  Any allergies you have.  All medicines you are taking, including vitamins, herbs, eye drops, creams, and over-the-counter medicines.  Any problems you or family members have had with anesthetic medicines.  Any blood disorders you have.  Any surgeries you have had.  Any medical conditions you have.  Whether you are pregnant or may be pregnant. What are the risks? Generally, this is a safe procedure. However, problems may occur, including:  Infection.  Bleeding. Blood may pool near the incision area (hematoma).  Allergic reactions to medicines.  Damage to nearby structures or organs, such as the dark area around the nipple (areola).  Partial or total numbness in the nipple or breast. Sometimes feeling returns, but not always.  Inability to breastfeed.  Pneumonia.  Blood clots. What happens before the procedure? Staying hydrated Follow instructions from your health care provider about hydration, which may include:  Up to 2 hours before the procedure - you may continue to drink clear liquids, such as water, clear fruit juice, black coffee, and plain tea.  Eating and drinking restrictions Follow instructions from your health care provider about eating and drinking, which may include:  8 hours before the procedure - stop  eating heavy meals or foods, such as meat, fried foods, or fatty foods.  6 hours before the procedure - stop eating light meals or foods, such as toast or cereal.  6 hours before the procedure - stop drinking milk or drinks that contain milk.  2 hours before the procedure - stop drinking clear liquids. Medicines Ask your health care provider about:  Changing or stopping your regular medicines. This is especially important if you are taking diabetes medicines or blood thinners.  Taking medicines such as aspirin and ibuprofen. These medicines can thin your blood. Do not take these medicines unless your health care provider tells you to take them.  Taking other over-the-counter medicines, vitamins, herbs, and supplements. Tests You may have tests or exams, such as:  Blood tests.  An X-ray exam of the breasts (mammogram). General instructions  Ask your health care provider: ? How your surgery site will be marked. ? What steps will be taken to help prevent infection. These may include:  Washing skin with a germ-killing soap.  Taking antibiotic medicine.  You may be asked to bathe using a germ-killing soap before your procedure.  Do not use any products that contain nicotine or tobacco for at least 4 weeks before the procedure. These products include cigarettes, e-cigarettes, and chewing tobacco. If you need help quitting, ask your health care provider.  Plan to have someone take you home from the hospital or clinic.  Plan to have a responsible adult care for you for at least 24 hours after you leave the hospital or clinic. This is important. What happens during the procedure?  An IV will  be inserted into one of your veins.  You will be given one or more of the following: ? A medicine to help you relax (sedative). ? A medicine to numb the area (local anesthetic). ? A medicine to make you fall asleep (general anesthetic).  An incision will be made in each breast.  Fat,  tissue, and excess skin will be removed from each breast.  Your breasts will be reshaped. Your nipples may be moved so they are centered on your smaller breasts.  Tubes may be placed in your breast tissue to drain fluid from your surgical area.  Your incisions will be closed with stitches (sutures) and covered with surgical tape. Your breasts will be covered with gauze and elastic bandages (dressings). The procedure may vary among health care providers and hospitals. What happens after the procedure?  Your blood pressure, heart rate, breathing rate, and blood oxygen level will be monitored until you leave the hospital or clinic.  You may continue to receive fluids and medicines through an IV.  You may have to wear compression stockings. These stockings help to prevent blood clots and reduce swelling in your legs.  You may continue to have tubes draining fluid from your surgical area. The tubes will be removed 2-3 days after the surgery.  You may be given a certain type of bra to wear.  You may have breast pain. You will be given medicine to help relieve pain.  Do not drive for 24 hours if you were given a sedative during the procedure. Summary  Breast reduction, also called reduction mammoplasty, is surgery to reduce the size of the breasts by removing fat, tissue, and excess skin.  Tubes may be placed in your breast tissue to drain fluid from your surgical area. The tubes will be removed 2-3 days after the surgery.  Your incisions will be closed with stitches (sutures) and covered with surgical tape. Your breasts will be covered with gauze and elastic bandages (dressings).  You may have breast pain after the procedure. You will be given medicine to help relieve pain.  Plan to have someone take you home from the hospital or clinic. This information is not intended to replace advice given to you by your health care provider. Make sure you discuss any questions you have with your  health care provider. Document Revised: 06/15/2019 Document Reviewed: 06/15/2019 Elsevier Patient Education  [place].

## 2020-05-22 ENCOUNTER — Encounter: Payer: Self-pay | Admitting: Emergency Medicine

## 2020-05-22 ENCOUNTER — Other Ambulatory Visit: Payer: Self-pay

## 2020-05-22 ENCOUNTER — Emergency Department
Admission: EM | Admit: 2020-05-22 | Discharge: 2020-05-22 | Disposition: A | Discharge status: Home / Self Care | Payer: BC Managed Care – PPO | Attending: Emergency Medicine | Admitting: Emergency Medicine

## 2020-05-22 ENCOUNTER — Emergency Department: Payer: BC Managed Care – PPO

## 2020-05-22 DIAGNOSIS — R519 Headache, unspecified: Secondary | ICD-10-CM | POA: Diagnosis not present

## 2020-05-22 DIAGNOSIS — Z79899 Other long term (current) drug therapy: Secondary | ICD-10-CM | POA: Insufficient documentation

## 2020-05-22 DIAGNOSIS — J45909 Unspecified asthma, uncomplicated: Secondary | ICD-10-CM | POA: Diagnosis not present

## 2020-05-22 DIAGNOSIS — I10 Essential (primary) hypertension: Secondary | ICD-10-CM | POA: Diagnosis not present

## 2020-05-22 LAB — COMPREHENSIVE METABOLIC PANEL
ALT: 21 U/L (ref 0–44)
AST: 26 U/L (ref 15–41)
Albumin: 4.2 g/dL (ref 3.5–5.0)
Alkaline Phosphatase: 58 U/L (ref 38–126)
Anion gap: 10 (ref 5–15)
BUN: 18 mg/dL (ref 6–20)
CO2: 26 mmol/L (ref 22–32)
Calcium: 8.9 mg/dL (ref 8.9–10.3)
Chloride: 101 mmol/L (ref 98–111)
Creatinine, Ser: 0.97 mg/dL (ref 0.44–1.00)
GFR calc Af Amer: >60 mL/min (ref >60)
GFR calc non Af Amer: >60 mL/min (ref >60)
Glucose, Bld: 108 mg/dL — ABNORMAL HIGH (ref 70–99)
Potassium: 3.3 mmol/L — ABNORMAL LOW (ref 3.5–5.1)
Sodium: 137 mmol/L (ref 135–145)
Total Bilirubin: 0.6 mg/dL (ref 0.3–1.2)
Total Protein: 8 g/dL (ref 6.5–8.1)

## 2020-05-22 LAB — CBC WITH DIFFERENTIAL/PLATELET
Abs Immature Granulocytes: 0.01 10*3/uL (ref 0.00–0.07)
Basophils Absolute: 0 10*3/uL (ref 0.0–0.1)
Basophils Relative: 1 %
Eosinophils Absolute: 0.1 10*3/uL (ref 0.0–0.5)
Eosinophils Relative: 2 %
HCT: 42.3 % (ref 36.0–46.0)
Hemoglobin: 14.8 g/dL (ref 12.0–15.0)
Immature Granulocytes: 0 %
Lymphocytes Relative: 47 %
Lymphs Abs: 2 10*3/uL (ref 0.7–4.0)
MCH: 32.2 pg (ref 26.0–34.0)
MCHC: 35 g/dL (ref 30.0–36.0)
MCV: 92 fL (ref 80.0–100.0)
Monocytes Absolute: 0.2 10*3/uL (ref 0.1–1.0)
Monocytes Relative: 6 %
Neutro Abs: 1.8 10*3/uL (ref 1.7–7.7)
Neutrophils Relative %: 44 %
Platelets: 172 10*3/uL (ref 150–400)
RBC: 4.6 MIL/uL (ref 3.87–5.11)
RDW: 12.3 % (ref 11.5–15.5)
WBC: 4.1 10*3/uL (ref 4.0–10.5)
nRBC: 0 % (ref 0.0–0.2)

## 2020-05-22 LAB — URINALYSIS, COMPLETE (UACMP) WITH MICROSCOPIC
Bacteria, UA: NONE SEEN
Bilirubin Urine: NEGATIVE
Glucose, UA: NEGATIVE mg/dL
Hgb urine dipstick: NEGATIVE
Ketones, ur: NEGATIVE mg/dL
Leukocytes,Ua: NEGATIVE
Nitrite: NEGATIVE
Protein, ur: NEGATIVE mg/dL
Specific Gravity, Urine: 1.014 (ref 1.005–1.030)
pH: 5 (ref 5.0–8.0)

## 2020-05-22 LAB — POCT PREGNANCY, URINE: Preg Test, Ur: NEGATIVE

## 2020-05-22 MED ORDER — DIPHENHYDRAMINE HCL 50 MG/ML IJ SOLN
INTRAMUSCULAR | Status: AC
  Filled 2020-05-22: qty 1

## 2020-05-22 MED ORDER — DIPHENHYDRAMINE HCL 50 MG/ML IJ SOLN
25.0000 mg | Freq: Once | INTRAMUSCULAR | Status: AC
  Administered 2020-05-22: 25 mg via INTRAVENOUS

## 2020-05-22 MED ORDER — METOCLOPRAMIDE HCL 5 MG/ML IJ SOLN
10.0000 mg | Freq: Once | INTRAMUSCULAR | Status: AC
  Administered 2020-05-22: 10 mg via INTRAVENOUS
  Filled 2020-05-22: qty 2

## 2020-05-22 MED ORDER — ONDANSETRON HCL 4 MG/2ML IJ SOLN
4.0000 mg | Freq: Once | INTRAMUSCULAR | Status: AC
  Administered 2020-05-22: 4 mg via INTRAVENOUS
  Filled 2020-05-22: qty 2

## 2020-05-22 MED ORDER — BUTALBITAL-APAP-CAFFEINE 50-325-40 MG PO TABS: 1.0000 | ORAL_TABLET | Freq: Four times a day (QID) | ORAL | 0 refills | Status: DC | PRN

## 2020-05-22 MED ORDER — KETOROLAC TROMETHAMINE 30 MG/ML IJ SOLN
INTRAMUSCULAR | Status: AC
  Administered 2020-05-22: 30 mg via INTRAMUSCULAR
  Filled 2020-05-22: qty 1

## 2020-05-22 MED ORDER — SODIUM CHLORIDE 0.9 % IV BOLUS
1000.0000 mL | Freq: Once | INTRAVENOUS | Status: AC
  Administered 2020-05-22: 1000 mL via INTRAVENOUS

## 2020-05-22 MED ORDER — KETOROLAC TROMETHAMINE 30 MG/ML IJ SOLN: 30.0000 mg | Freq: Once | INTRAMUSCULAR | Status: AC

## 2020-05-22 NOTE — ED Triage Notes (Signed)
Patient ambulatory to triage with steady gait, without difficulty or distress noted, mask in place; pt reports since yesterday having HA "all over" accomp by nausea; st hx HA

## 2020-05-22 NOTE — ED Provider Notes (Signed)
[facility] Emergency Department Provider Note  ____________________________________________   First MD Initiated Contact with Patient 05/22/20 [phone]     (approximate)  I have reviewed the triage vital signs and the nursing notes.   HISTORY  Chief Complaint Headache   HPI Gabriela Thomas is a 39 y.o. female presents to the ED with complaint of generalized headache that started yesterday.  Patient states that she has nausea but no vomiting with her headache.  She denies any changes in her vision, minimal photophobia, negative for dizziness or fever/chills.  Patient denies URI symptoms.  Patient reports a history of headaches.  Currently she rates her pain as a 10/10.   Past Medical History:  Diagnosis Date  . Anxiety   . Asthma    AS A CHILD -NO INHALERS  . Fibroid   . GERD (gastroesophageal reflux disease)    OCC  . Headache   . Hypertension     Patient Active Problem List   Diagnosis Date Noted  . S/P myomectomy 12/11/2018  . Essential hypertension 11/19/2018  . Intramural leiomyoma of uterus 11/15/2018    Past Surgical History:  Procedure Laterality Date  . LAPAROTOMY N/A 12/12/2018   Procedure: EXPLORATORY LAPAROTOMY, evacuation of hemoperitoneum, aspiration of left adnexal cyst;  Surgeon: Rubie Maid, MD;  Location: [facility] ORS;  Service: Gynecology;  Laterality: N/A;  . MYOMECTOMY N/A 12/11/2018   Procedure: ABDOMINAL MYOMECTOMY;  Surgeon: Rubie Maid, MD;  Location: [facility] ORS;  Service: Gynecology;  Laterality: N/A;  . NO PAST SURGERIES      Prior to Admission medications   Medication Sig Start Date End Date Taking? Authorizing Provider  FLUoxetine (PROZAC) 20 MG tablet Take 20 mg by mouth daily.   Yes [provider]  losartan-hydrochlorothiazide (HYZAAR) 100-12.5 MG tablet Take 1 tablet by mouth daily.   Yes [provider]  metoprolol succinate (TOPROL-XL) 25 MG 24 hr tablet Take 25 mg by mouth daily.   Yes  [provider]  butalbital-acetaminophen-caffeine (FIORICET) 50-325-40 MG tablet Take 1 tablet by mouth every 6 (six) hours as needed for headache. 05/22/20   Johnn Hai, PA-C  Drospirenone (SLYND) 4 MG TABS Take 1 tablet by mouth daily. Patient not taking: Reported on 05/07/2020 09/11/19   Rubie Maid, MD  hydrochlorothiazide (MICROZIDE) 12.5 MG capsule Take 12.5 mg by mouth daily.    [provider]    Allergies Patient has no known allergies.  Family History  Problem Relation Age of Onset  . Hypertension Mother   . Diabetes Mother   . Congestive Heart Failure Mother   . Hypertension Maternal Aunt   . Hypertension Maternal Uncle   . Hypertension Maternal Grandmother   . Hypertension Maternal Grandfather     Social History Social History   Tobacco Use  . Smoking status: Never Smoker  . Smokeless tobacco: Never Used  Substance Use Topics  . Alcohol use: Yes    Alcohol/week: 7.0 standard drinks    Types: 7 Glasses of wine per week  . Drug use: No    Review of Systems Constitutional: No fever/chills Eyes: No visual changes.  Denies photophobia. ENT: No sore throat. Cardiovascular: Denies chest pain. Respiratory: Denies shortness of breath.  Negative for cough. Gastrointestinal: No abdominal pain.  No nausea, no vomiting.  No diarrhea.   Genitourinary: Negative for dysuria. Musculoskeletal: Negative for musculoskeletal pain. Skin: Negative for rash. Neurological: Positive for headaches, negative for focal weakness or numbness.  ____________________________________________   PHYSICAL EXAM:  VITAL SIGNS: ED Triage Vitals  Enc Vitals Group     BP 05/22/20 0340 (!) 174/122     Pulse Rate 05/22/20 0340 86     Resp 05/22/20 0340 16     Temp 05/22/20 0340 97.8 F (36.6 C)     Temp Source 05/22/20 0340 Oral     SpO2 05/22/20 0340 99 %     Weight 05/22/20 0339 170 lb (77.1 kg)     Height 05/22/20 0339 5\' 4"  (1.626 m)     Head Circumference  --      Peak Flow --      Pain Score 05/22/20 0339 10     Pain Loc --      Pain Edu? --      Excl. in [place]? --     Constitutional: Alert and oriented. Well appearing and in no acute distress.  Patient is tearful while getting history of present illness. Eyes: Conjunctivae are normal. PERRL. EOMI. Head: Atraumatic. Nose: No congestion/rhinnorhea. Mouth/Throat: Mucous membranes are moist.  Oropharynx non-erythematous. Neck: No stridor.  Nontender cervical spine to palpation posteriorly. Hematological/Lymphatic/Immunilogical: No cervical lymphadenopathy. Cardiovascular: Normal rate, regular rhythm. Grossly normal heart sounds.  Good peripheral circulation. Respiratory: Normal respiratory effort.  No retractions. Lungs CTAB. Gastrointestinal: Soft and nontender. No distention. Musculoskeletal: Moves upper and lower extremities without any difficulty.  Normal gait was noted.  Good muscle strength bilaterally. Neurologic:  Normal speech and language. No gross focal neurologic deficits are appreciated. No gait instability. Skin:  Skin is warm, dry and intact. No rash noted. Psychiatric: Mood and affect are normal. Speech and behavior are normal.  ____________________________________________   LABS (all labs ordered are listed, but only abnormal results are displayed)  Labs Reviewed  COMPREHENSIVE METABOLIC PANEL - Abnormal; Notable for the following components:      Result Value   Potassium 3.3 (*)    Glucose, Bld 108 (*)    All other components within normal limits  URINALYSIS, COMPLETE (UACMP) WITH MICROSCOPIC - Abnormal; Notable for the following components:   Color, Urine STRAW (*)    APPearance CLEAR (*)    All other components within normal limits  CBC WITH DIFFERENTIAL/PLATELET  POC URINE PREG, ED  POCT PREGNANCY, URINE   RADIOLOGY   Official radiology report(s): CT Head Wo Contrast  Result Date: 05/22/2020 CLINICAL DATA:  Acute headache, normal neurological exam,  hypertension, onset of headache yesterday accompanied by nausea EXAM: CT HEAD WITHOUT CONTRAST TECHNIQUE: Contiguous axial images were obtained from the base of the skull through the vertex without intravenous contrast. Sagittal and coronal MPR images reconstructed from axial data set. COMPARISON:  None FINDINGS: Brain: Normal ventricular morphology. No midline shift or mass effect. Normal appearance of brain parenchyma. No intracranial hemorrhage, mass lesion, evidence of acute infarction, or extra-axial fluid collection. Vascular: No hyperdense vessels Skull: Intact Sinuses/Orbits: Clear Other: N/A IMPRESSION: Normal exam. Electronically Signed   By: Lavonia Dana M.D.   On: 05/22/2020 08:39    ____________________________________________   PROCEDURES  Procedure(s) performed (including Critical Care):  Procedures   ____________________________________________   INITIAL IMPRESSION / ASSESSMENT AND PLAN / ED COURSE  As part of my medical decision making, I reviewed the following data within the electronic MEDICAL RECORD NUMBER Notes from prior ED visits and [place] Controlled Substance Database    ----------------------------------------- 9:24 AM on 05/22/2020 ----------------------------------------- Headache is improving and patient is able to sleep.    39 year old female presents to the ED with complaint of headache that began  last evening.  She describes as being generalized all over her head but began posteriorly.  Patient denies any visual changes or photophobia.  Patient also reports that she was seen for a annual physical yesterday and because her blood pressure was elevated her blood pressure medication was changed however she has not started the new medication today.  In looking through her records she is to start on Benicar with 25 mg of hydrochlorothiazide.  She is to continue taking the metoprolol 25 mg.  Neurologically patient is intact.  As her headache improved so did her blood  pressure.  She is encouraged to begin taking her new medication as prescribed by her doctor.  She is to discontinue taking the losartan.  Patient was given a prescription for Fioricet to take as needed for continued headache, 12 tablets prescription was given.  Patient was given return precautions for her headache. ____________________________________________   FINAL CLINICAL IMPRESSION(S) / ED DIAGNOSES  Final diagnoses:  Headache disorder  Hypertension, uncontrolled     ED Discharge Orders         Ordered    butalbital-acetaminophen-caffeine (FIORICET) 50-325-40 MG tablet  Every 6 hours PRN     05/22/20 1003           Note:  This document was prepared using Dragon voice recognition software and may include unintentional dictation errors.    Johnn Hai, PA-C 05/22/20 1430    Vanessa Bransford, MD 05/22/20 [phone]

## 2020-05-22 NOTE — Discharge Instructions (Addendum)
Call your primary care provider if any continued problems.  The medication was changed in your primary care provider's notes however it does not appear that the new medication was sent to the pharmacy.  Call your primary care provider to have this rechecked.  You are to stay on the metoprolol at the same dose and add the new medication.  Increase fluids today.  A prescription for Fioricet was sent to your pharmacy if needed for continued headache.

## 2020-05-22 NOTE — ED Notes (Signed)
See triage note  Presents with h/a since last pm  States pain started in back of head   States she has had some nausea  Pt is very upset about the wait  Tearful on arrival

## 2020-05-29 ENCOUNTER — Encounter: Payer: Self-pay | Admitting: Plastic Surgery

## 2020-05-29 ENCOUNTER — Ambulatory Visit: Payer: BC Managed Care – PPO | Admitting: Plastic Surgery

## 2020-05-29 ENCOUNTER — Other Ambulatory Visit: Payer: Self-pay

## 2020-05-29 VITALS — BP 147/97 | HR 95 | Temp 97.7°F | Ht 65.0 in | Wt 174.4 lb

## 2020-05-29 DIAGNOSIS — M4004 Postural kyphosis, thoracic region: Secondary | ICD-10-CM | POA: Diagnosis not present

## 2020-05-29 DIAGNOSIS — M546 Pain in thoracic spine: Secondary | ICD-10-CM

## 2020-05-29 DIAGNOSIS — Z719 Counseling, unspecified: Secondary | ICD-10-CM

## 2020-05-29 DIAGNOSIS — N62 Hypertrophy of breast: Secondary | ICD-10-CM | POA: Diagnosis not present

## 2020-05-29 DIAGNOSIS — M545 Low back pain, unspecified: Secondary | ICD-10-CM

## 2020-05-29 NOTE — Progress Notes (Signed)
Referring Provider [address] [place],   [address]   CC:  Chief Complaint  Patient presents with  . Consult    BL breast reduction      Gabriela Thomas is an 39 y.o. female.  HPI: Patient presents to discuss breast reduction.  She has had years of back pain, neck pain, and shoulder grooving related to her large breasts.  She gets rashes beneath the folds and skin injuries from the weight of her bra straps.  She has tried warm packs, cold packs, and over-the-counter medications for the pain and rashes with little success.  She believes she is done having kids.  She did have a mammogram several years ago years ago due to some palpable cysts but this was benign.  She is currently a G cup and wants to be around a C cup.  She does not smoke.  She also complains of excess axillary breast tissue.  It is intermittently painful and hurts when she rests her arms down.  No Known Allergies  Outpatient Encounter Medications as of 05/29/2020  Medication Sig  . butalbital-acetaminophen-caffeine (FIORICET) 50-325-40 MG tablet Take 1 tablet by mouth every 6 (six) hours as needed for headache.  Marland Kitchen FLUoxetine (PROZAC) 20 MG tablet Take 20 mg by mouth daily.  . hydrochlorothiazide (MICROZIDE) 12.5 MG capsule Take 12.5 mg by mouth daily.  Marland Kitchen losartan-hydrochlorothiazide (HYZAAR) 100-12.5 MG tablet Take 1 tablet by mouth daily.  . metoprolol succinate (TOPROL-XL) 25 MG 24 hr tablet Take 25 mg by mouth daily.  Marland Kitchen olmesartan-hydrochlorothiazide (BENICAR HCT) 40-25 MG tablet Take 1 tablet by mouth daily.  . Drospirenone (SLYND) 4 MG TABS Take 1 tablet by mouth daily. (Patient not taking: Reported on 05/07/2020)   No facility-administered encounter medications on file as of 05/29/2020.     Past Medical History:  Diagnosis Date  . Anxiety   . Asthma    AS A CHILD -NO INHALERS  . Fibroid   . GERD (gastroesophageal reflux disease)    OCC  . Headache   . Hypertension      Past Surgical History:  Procedure Laterality Date  . LAPAROTOMY N/A 12/12/2018   Procedure: EXPLORATORY LAPAROTOMY, evacuation of hemoperitoneum, aspiration of left adnexal cyst;  Surgeon: Rubie Maid, MD;  Location: [facility] ORS;  Service: Gynecology;  Laterality: N/A;  . MYOMECTOMY N/A 12/11/2018   Procedure: ABDOMINAL MYOMECTOMY;  Surgeon: Rubie Maid, MD;  Location: [facility] ORS;  Service: Gynecology;  Laterality: N/A;  . NO PAST SURGERIES      Family History  Problem Relation Age of Onset  . Hypertension Mother   . Diabetes Mother   . Congestive Heart Failure Mother   . Hypertension Maternal Aunt   . Hypertension Maternal Uncle   . Hypertension Maternal Grandmother   . Hypertension Maternal Grandfather     Social History   Social History Narrative  . Not on file  Denies tobacco use  Review of Systems General: Denies fevers, chills, weight loss CV: Denies chest pain, shortness of breath, palpitations  Physical Exam Vitals with BMI 05/29/2020 05/22/2020 05/22/2020  Height 5\' 5"  - -  Weight 174 lbs 6 oz - -  BMI Q000111Q - -  Systolic Q000111Q [id number]  Diastolic 97 [id number]  Pulse 95 78 80    General:  No acute distress,  Alert and oriented, Non-Toxic, Normal speech and affect Breast: She has grade 2 ptosis.  Sternal notch to nipple is 33 cm bilaterally.  Nipple  to fold distance is 17 cm on the right and 16 cm on the left.  I do not feel any obvious scars or masses.  Assessment/Plan The patient has bilateral symptomatic macromastia.  She is a good candidate for a breast reduction.  She is interested in pursuing surgical treatment.  The details of breast reduction surgery were discussed.  I explained the procedure in detail along the with the expected scars.  The risks were discussed in detail and include bleeding, infection, damage to surrounding structures, need for additional procedures, nipple loss, change in nipple sensation, persistent pain, contour irregularities and  asymmetries.  I explained that breast feeding is often not possible after breast reduction surgery.  We discussed the expected postoperative course with an overall recovery period of about 1 month.  She demonstrated full understanding of all risks.  We discussed her personal risk factors.  For the axillary breast tissue I think this can be excised directly at the time of her breast reduction.  We discussed the possibility of using drains at both sites.  I anticipate approximately 700 g of tissue removed from each side.   Gabriela Thomas 05/29/2020, 10:23 AM

## 2020-06-05 ENCOUNTER — Institutional Professional Consult (permissible substitution): Payer: BC Managed Care – PPO | Admitting: Plastic Surgery

## 2020-06-20 ENCOUNTER — Institutional Professional Consult (permissible substitution): Payer: BC Managed Care – PPO | Admitting: Plastic Surgery

## 2020-07-02 ENCOUNTER — Encounter: Payer: Self-pay | Admitting: Surgical

## 2020-07-02 ENCOUNTER — Ambulatory Visit (INDEPENDENT_AMBULATORY_CARE_PROVIDER_SITE_OTHER): Payer: BC Managed Care – PPO | Admitting: Surgical

## 2020-07-02 ENCOUNTER — Other Ambulatory Visit: Payer: Self-pay

## 2020-07-02 VITALS — BP 137/96 | HR 81 | Temp 98.2°F | Ht 64.5 in | Wt 174.6 lb

## 2020-07-02 DIAGNOSIS — M545 Low back pain, unspecified: Secondary | ICD-10-CM

## 2020-07-02 DIAGNOSIS — N62 Hypertrophy of breast: Secondary | ICD-10-CM

## 2020-07-02 DIAGNOSIS — M546 Pain in thoracic spine: Secondary | ICD-10-CM

## 2020-07-02 DIAGNOSIS — M4004 Postural kyphosis, thoracic region: Secondary | ICD-10-CM

## 2020-07-02 MED ORDER — ONDANSETRON HCL 4 MG PO TABS: 4.0000 mg | ORAL_TABLET | Freq: Three times a day (TID) | ORAL | 0 refills | Status: DC | PRN

## 2020-07-02 MED ORDER — HYDROCODONE-ACETAMINOPHEN 5-325 MG PO TABS: 1.0000 | ORAL_TABLET | Freq: Four times a day (QID) | ORAL | 0 refills | Status: AC | PRN

## 2020-07-02 NOTE — Progress Notes (Addendum)
Patient ID: Gabriela Thomas, female    DOB: 12/20/1981, 39 y.o.   MRN: [id number]  Chief Complaint  Patient presents with  . Pre-op Exam      ICD-10-CM   1. Macromastia  N62   2. Back pain of thoracolumbar region  M54.5    M54.6   3. Postural kyphosis, thoracic region  M40.04     History of Present Illness: Gabriela Thomas is a 39 y.o.  female  with a history of mammary hypertrophy.  She presents for preoperative evaluation for upcoming procedure, bilateral breast reduction with liposuction, scheduled for 07/14/20 with Dr. Claudia Desanctis  Patient reports having a mammogram many years ago, reports it was normal, but noted dense breast tissue. Per EMR review, 11/2013 with [place] of left breast showing benign fibrocystic disease. No family hx of breast cancer.  The patient has not had problems with anesthesia. No history of DVT/PE.  No immediate family history of DVT/PE - does report a cousin had a DVT, but she is unaware of the circumstances.  No family or personal history of bleeding or clotting disorders.  Patient is not currently taking any blood thinners.  No history of CVA/MI.   Summary of Previous Visit: "She has had years of back pain, neck pain, and shoulder grooving related to her large breasts.  She gets rashes beneath the folds and skin injuries from the weight of her bra straps.  She has tried warm packs, cold packs, and over-the-counter medications for the pain and rashes with little success.   She is currently a G cup and wants to be around a C cup."  Patient reports today she would like to be either a C or D cup, but she is unsure. Estimated removal of 700 g from each side.  Job: Freight forwarder at [employer] Significant for: HTN   Past Medical History: Allergies: No Known Allergies  Current Medications:  Current Outpatient Medications:  .  butalbital-acetaminophen-caffeine (FIORICET) 50-325-40 MG tablet, Take 1 tablet by mouth every 6 (six) hours as needed for headache., Disp: 15  tablet, Rfl: 0 .  metoprolol succinate (TOPROL-XL) 25 MG 24 hr tablet, Take 25 mg by mouth daily., Disp: , Rfl:  .  olmesartan-hydrochlorothiazide (BENICAR HCT) 40-25 MG tablet, Take 1 tablet by mouth daily., Disp: , Rfl:  .  Drospirenone (SLYND) 4 MG TABS, Take 1 tablet by mouth daily. (Patient not taking: Reported on 05/07/2020), Disp: 84 tablet, Rfl: 3 .  FLUoxetine (PROZAC) 20 MG tablet, Take 20 mg by mouth daily., Disp: , Rfl:  .  losartan-hydrochlorothiazide (HYZAAR) 100-12.5 MG tablet, Take 1 tablet by mouth daily., Disp: , Rfl:   Past Medical Problems: Past Medical History:  Diagnosis Date  . Anxiety   . Asthma    AS A CHILD -NO INHALERS  . Fibroid   . GERD (gastroesophageal reflux disease)    OCC  . Headache   . Hypertension     Past Surgical History: Past Surgical History:  Procedure Laterality Date  . LAPAROTOMY N/A 12/12/2018   Procedure: EXPLORATORY LAPAROTOMY, evacuation of hemoperitoneum, aspiration of left adnexal cyst;  Surgeon: Rubie Maid, MD;  Location: [facility] ORS;  Service: Gynecology;  Laterality: N/A;  . MYOMECTOMY N/A 12/11/2018   Procedure: ABDOMINAL MYOMECTOMY;  Surgeon: Rubie Maid, MD;  Location: [facility] ORS;  Service: Gynecology;  Laterality: N/A;  . NO PAST SURGERIES      Social History: Social History   Socioeconomic History  . Marital status: Single  Spouse name: Not on file  . Number of children: Not on file  . Years of education: Not on file  . Highest education level: Not on file  Occupational History  . Not on file  Tobacco Use  . Smoking status: Never Smoker  . Smokeless tobacco: Never Used  Vaping Use  . Vaping Use: Never used  Substance and Sexual Activity  . Alcohol use: Yes    Alcohol/week: 7.0 standard drinks    Types: 7 Glasses of wine per week  . Drug use: No  . Sexual activity: Yes    Birth control/protection: Condom  Other Topics Concern  . Not on file  Social History Narrative  . Not on file   Social Determinants  of Health   Financial Resource Strain:   . Difficulty of Paying Living Expenses:   Food Insecurity:   . Worried About Charity fundraiser in the Last Year:   . Arboriculturist in the Last Year:   Transportation Needs:   . Film/video editor (Medical):   Marland Kitchen Lack of Transportation (Non-Medical):   Physical Activity:   . Days of Exercise per Week:   . Minutes of Exercise per Session:   Stress:   . Feeling of Stress :   Social Connections:   . Frequency of Communication with Friends and Family:   . Frequency of Social Gatherings with Friends and Family:   . Attends Religious Services:   . Active Member of Clubs or Organizations:   . Attends Archivist Meetings:   Marland Kitchen Marital Status:   Intimate Partner Violence:   . Fear of Current or Ex-Partner:   . Emotionally Abused:   Marland Kitchen Physically Abused:   . Sexually Abused:     Family History: Family History  Problem Relation Age of Onset  . Hypertension Mother   . Diabetes Mother   . Congestive Heart Failure Mother   . Hypertension Maternal Aunt   . Hypertension Maternal Uncle   . Hypertension Maternal Grandmother   . Hypertension Maternal Grandfather     Review of Systems: Review of Systems  Constitutional: Negative.   Respiratory: Negative.   Cardiovascular: Negative.   Gastrointestinal: Negative.     Physical Exam: Vital Signs BP (!) 137/96 (BP Location: Left Arm, Patient Position: Sitting, Cuff Size: Normal)   Pulse 81   Temp 98.2 F (36.8 C) (Temporal)   Ht 5' 4.5" (1.638 m)   Wt 174 lb 9.6 oz (79.2 kg)   LMP 06/12/2020 (Exact Date)   SpO2 99%   BMI 29.51 kg/m   Physical Exam Exam conducted with a chaperone present.  Constitutional:      General: She is not in acute distress.    Appearance: Normal appearance. She is not ill-appearing.  HENT:     Head: Normocephalic and atraumatic.  Slightly hypertrophic scar noted superior to right medial eyebrow. Eyes:     Pupils: Pupils are equal, round Neck:       Musculoskeletal: Normal range of motion.  Cardiovascular:     Rate and Rhythm: Normal rate and regular rhythm.     Pulses: Normal pulses.     Heart sounds: Normal heart sounds. No murmur.  Pulmonary:     Effort: Pulmonary effort is normal. No respiratory distress.     Breath sounds: Normal breath sounds. No wheezing.  Abdominal:     General: Abdomen is flat. There is no distension.     Palpations: Abdomen is soft.  Tenderness: There is no abdominal tenderness.      -Patient has hypertrophic horizontal scar noted over lower abdomen from previous surgical intervention.  Musculoskeletal: Normal range of motion.  Skin:    General: Skin is warm and dry.     Findings: No erythema or rash.  Neurological:     General: No focal deficit present.     Mental Status: She is alert and oriented to person, place, and time. Mental status is at baseline.     Motor: No weakness.  Psychiatric:        Mood and Affect: Mood normal.        Behavior: Behavior normal.    Assessment/Plan: Ms. Kiker is scheduled for bilateral breast reduction with liposuction with Dr. Claudia Desanctis.  Risks, benefits, and alternatives of procedure discussed, questions answered and consent obtained.    Discussed with patient that due to her history of hypertrophic scars from previous surgical intervention/injuries, it is more likely that her post breast reduction scars will be hypertrophic as well.  I discussed with her some options for reducing the chance of hypertrophic scars which include silicone cream, scar creams, silicone patches.  Smoking Status: non smoker Last Mammogram: Many years ago; Results: Negative per patient, but reports dense breast tissue.  Caprini Score: 3; Risk Factors include: BMI greater than 25, and length of planned surgery. Recommendation for mechanical prophylaxis during surgery. Encourage early ambulation.   Pictures obtained: 05/29/2020  Post-op Rx sent to pharmacy: Zofran, Norco  Patient I  discussed that expected time to better work would be 2 weeks.  She can return to work with lifting restrictions after 2 weeks, no lifting greater than 15 pounds x 6 weeks.  Patient was provided with the breast reduction and General Surgical Risk consent document and Pain Medication Agreement prior to their appointment.  They had adequate time to read through the risk consent documents and Pain Medication Agreement. We also discussed them in person together during this preop appointment. All of their questions were answered to their satisfaction.  Recommended calling if they have any further questions.  Risk consent form and Pain Medication Agreement to be scanned into patient's chart.  The risk that can be encountered with breast reduction were discussed and include the following but not limited to these:  Breast asymmetry, fluid accumulation, firmness of the breast, inability to breast feed, loss of nipple or areola, skin loss, decrease or no nipple sensation, fat necrosis of the breast tissue, bleeding, infection, healing delay.  There are risks of anesthesia, changes to skin sensation and injury to nerves or blood vessels.  The muscle can be temporarily or permanently injured.  You may have an allergic reaction to tape, suture, glue, blood products which can result in skin discoloration, swelling, pain, skin lesions, poor healing.  Any of these can lead to the need for revisonal surgery or stage procedures.  A reduction has potential to interfere with diagnostic procedures.  Nipple or breast piercing can increase risks of infection.  This procedure is best done when the breast is fully developed.  Changes in the breast will continue to occur over time.  Pregnancy can alter the outcomes of previous breast reduction surgery, weight gain and weigh loss can also effect the long term appearance.    Electronically signed by: Carola Rhine Janice Seales, PA-C 07/02/2020 9:24 AM

## 2020-07-02 NOTE — H&P (View-Only) (Signed)
Patient ID: Gabriela Thomas, female    DOB: 1981/04/15, 39 y.o.   MRN: [id number]  Chief Complaint  Patient presents with  . Pre-op Exam      ICD-10-CM   1. Macromastia  N62   2. Back pain of thoracolumbar region  M54.5    M54.6   3. Postural kyphosis, thoracic region  M40.04     History of Present Illness: Gabriela Thomas is a 39 y.o.  female  with a history of mammary hypertrophy.  She presents for preoperative evaluation for upcoming procedure, bilateral breast reduction with liposuction, scheduled for 07/14/20 with Dr. Claudia Desanctis  Patient reports having a mammogram many years ago, reports it was normal, but noted dense breast tissue. Per EMR review, 11/2013 with [place] of left breast showing benign fibrocystic disease. No family hx of breast cancer.  The patient has not had problems with anesthesia. No history of DVT/PE.  No immediate family history of DVT/PE - does report a cousin had a DVT, but she is unaware of the circumstances.  No family or personal history of bleeding or clotting disorders.  Patient is not currently taking any blood thinners.  No history of CVA/MI.   Summary of Previous Visit: "She has had years of back pain, neck pain, and shoulder grooving related to her large breasts.  She gets rashes beneath the folds and skin injuries from the weight of her bra straps.  She has tried warm packs, cold packs, and over-the-counter medications for the pain and rashes with little success.   She is currently a G cup and wants to be around a C cup."  Patient reports today she would like to be either a C or D cup, but she is unsure. Estimated removal of 700 g from each side.  Job: Freight forwarder at [employer] Significant for: HTN   Past Medical History: Allergies: No Known Allergies  Current Medications:  Current Outpatient Medications:  .  butalbital-acetaminophen-caffeine (FIORICET) 50-325-40 MG tablet, Take 1 tablet by mouth every 6 (six) hours as needed for headache., Disp: 15  tablet, Rfl: 0 .  metoprolol succinate (TOPROL-XL) 25 MG 24 hr tablet, Take 25 mg by mouth daily., Disp: , Rfl:  .  olmesartan-hydrochlorothiazide (BENICAR HCT) 40-25 MG tablet, Take 1 tablet by mouth daily., Disp: , Rfl:  .  Drospirenone (SLYND) 4 MG TABS, Take 1 tablet by mouth daily. (Patient not taking: Reported on 05/07/2020), Disp: 84 tablet, Rfl: 3 .  FLUoxetine (PROZAC) 20 MG tablet, Take 20 mg by mouth daily., Disp: , Rfl:  .  losartan-hydrochlorothiazide (HYZAAR) 100-12.5 MG tablet, Take 1 tablet by mouth daily., Disp: , Rfl:   Past Medical Problems: Past Medical History:  Diagnosis Date  . Anxiety   . Asthma    AS A CHILD -NO INHALERS  . Fibroid   . GERD (gastroesophageal reflux disease)    OCC  . Headache   . Hypertension     Past Surgical History: Past Surgical History:  Procedure Laterality Date  . LAPAROTOMY N/A 12/12/2018   Procedure: EXPLORATORY LAPAROTOMY, evacuation of hemoperitoneum, aspiration of left adnexal cyst;  Surgeon: Rubie Maid, MD;  Location: [facility] ORS;  Service: Gynecology;  Laterality: N/A;  . MYOMECTOMY N/A 12/11/2018   Procedure: ABDOMINAL MYOMECTOMY;  Surgeon: Rubie Maid, MD;  Location: [facility] ORS;  Service: Gynecology;  Laterality: N/A;  . NO PAST SURGERIES      Social History: Social History   Socioeconomic History  . Marital status: Single  Spouse name: Not on file  . Number of children: Not on file  . Years of education: Not on file  . Highest education level: Not on file  Occupational History  . Not on file  Tobacco Use  . Smoking status: Never Smoker  . Smokeless tobacco: Never Used  Vaping Use  . Vaping Use: Never used  Substance and Sexual Activity  . Alcohol use: Yes    Alcohol/week: 7.0 standard drinks    Types: 7 Glasses of wine per week  . Drug use: No  . Sexual activity: Yes    Birth control/protection: Condom  Other Topics Concern  . Not on file  Social History Narrative  . Not on file   Social Determinants  of Health   Financial Resource Strain:   . Difficulty of Paying Living Expenses:   Food Insecurity:   . Worried About Charity fundraiser in the Last Year:   . Arboriculturist in the Last Year:   Transportation Needs:   . Film/video editor (Medical):   Marland Kitchen Lack of Transportation (Non-Medical):   Physical Activity:   . Days of Exercise per Week:   . Minutes of Exercise per Session:   Stress:   . Feeling of Stress :   Social Connections:   . Frequency of Communication with Friends and Family:   . Frequency of Social Gatherings with Friends and Family:   . Attends Religious Services:   . Active Member of Clubs or Organizations:   . Attends Archivist Meetings:   Marland Kitchen Marital Status:   Intimate Partner Violence:   . Fear of Current or Ex-Partner:   . Emotionally Abused:   Marland Kitchen Physically Abused:   . Sexually Abused:     Family History: Family History  Problem Relation Age of Onset  . Hypertension Mother   . Diabetes Mother   . Congestive Heart Failure Mother   . Hypertension Maternal Aunt   . Hypertension Maternal Uncle   . Hypertension Maternal Grandmother   . Hypertension Maternal Grandfather     Review of Systems: Review of Systems  Constitutional: Negative.   Respiratory: Negative.   Cardiovascular: Negative.   Gastrointestinal: Negative.     Physical Exam: Vital Signs BP (!) 137/96 (BP Location: Left Arm, Patient Position: Sitting, Cuff Size: Normal)   Pulse 81   Temp 98.2 F (36.8 C) (Temporal)   Ht 5' 4.5" (1.638 m)   Wt 174 lb 9.6 oz (79.2 kg)   LMP 06/12/2020 (Exact Date)   SpO2 99%   BMI 29.51 kg/m   Physical Exam Exam conducted with a chaperone present.  Constitutional:      General: She is not in acute distress.    Appearance: Normal appearance. She is not ill-appearing.  HENT:     Head: Normocephalic and atraumatic.  Slightly hypertrophic scar noted superior to right medial eyebrow. Eyes:     Pupils: Pupils are equal, round Neck:       Musculoskeletal: Normal range of motion.  Cardiovascular:     Rate and Rhythm: Normal rate and regular rhythm.     Pulses: Normal pulses.     Heart sounds: Normal heart sounds. No murmur.  Pulmonary:     Effort: Pulmonary effort is normal. No respiratory distress.     Breath sounds: Normal breath sounds. No wheezing.  Abdominal:     General: Abdomen is flat. There is no distension.     Palpations: Abdomen is soft.  Tenderness: There is no abdominal tenderness.      -Patient has hypertrophic horizontal scar noted over lower abdomen from previous surgical intervention.  Musculoskeletal: Normal range of motion.  Skin:    General: Skin is warm and dry.     Findings: No erythema or rash.  Neurological:     General: No focal deficit present.     Mental Status: She is alert and oriented to person, place, and time. Mental status is at baseline.     Motor: No weakness.  Psychiatric:        Mood and Affect: Mood normal.        Behavior: Behavior normal.    Assessment/Plan: Ms. Woodham is scheduled for bilateral breast reduction with liposuction with Dr. Claudia Desanctis.  Risks, benefits, and alternatives of procedure discussed, questions answered and consent obtained.    Discussed with patient that due to her history of hypertrophic scars from previous surgical intervention/injuries, it is more likely that her post breast reduction scars will be hypertrophic as well.  I discussed with her some options for reducing the chance of hypertrophic scars which include silicone cream, scar creams, silicone patches.  Smoking Status: non smoker Last Mammogram: Many years ago; Results: Negative per patient, but reports dense breast tissue.  Caprini Score: 3; Risk Factors include: BMI greater than 25, and length of planned surgery. Recommendation for mechanical prophylaxis during surgery. Encourage early ambulation.   Pictures obtained: 05/29/2020  Post-op Rx sent to pharmacy: Zofran, Norco  Patient I  discussed that expected time to better work would be 2 weeks.  She can return to work with lifting restrictions after 2 weeks, no lifting greater than 15 pounds x 6 weeks.  Patient was provided with the breast reduction and General Surgical Risk consent document and Pain Medication Agreement prior to their appointment.  They had adequate time to read through the risk consent documents and Pain Medication Agreement. We also discussed them in person together during this preop appointment. All of their questions were answered to their satisfaction.  Recommended calling if they have any further questions.  Risk consent form and Pain Medication Agreement to be scanned into patient's chart.  The risk that can be encountered with breast reduction were discussed and include the following but not limited to these:  Breast asymmetry, fluid accumulation, firmness of the breast, inability to breast feed, loss of nipple or areola, skin loss, decrease or no nipple sensation, fat necrosis of the breast tissue, bleeding, infection, healing delay.  There are risks of anesthesia, changes to skin sensation and injury to nerves or blood vessels.  The muscle can be temporarily or permanently injured.  You may have an allergic reaction to tape, suture, glue, blood products which can result in skin discoloration, swelling, pain, skin lesions, poor healing.  Any of these can lead to the need for revisonal surgery or stage procedures.  A reduction has potential to interfere with diagnostic procedures.  Nipple or breast piercing can increase risks of infection.  This procedure is best done when the breast is fully developed.  Changes in the breast will continue to occur over time.  Pregnancy can alter the outcomes of previous breast reduction surgery, weight gain and weigh loss can also effect the long term appearance.    Electronically signed by: Carola Rhine Gailen Venne, PA-C 07/02/2020 9:24 AM

## 2020-07-07 ENCOUNTER — Encounter (HOSPITAL_BASED_OUTPATIENT_CLINIC_OR_DEPARTMENT_OTHER): Payer: Self-pay | Admitting: Plastic Surgery

## 2020-07-07 ENCOUNTER — Other Ambulatory Visit: Payer: Self-pay

## 2020-07-09 ENCOUNTER — Ambulatory Visit (INDEPENDENT_AMBULATORY_CARE_PROVIDER_SITE_OTHER): Payer: BC Managed Care – PPO | Admitting: Obstetrics and Gynecology

## 2020-07-09 ENCOUNTER — Encounter: Payer: Self-pay | Admitting: Obstetrics and Gynecology

## 2020-07-09 ENCOUNTER — Other Ambulatory Visit: Payer: Self-pay

## 2020-07-09 VITALS — BP 158/99 | HR 81 | Ht 64.5 in | Wt 177.2 lb

## 2020-07-09 DIAGNOSIS — Z01419 Encounter for gynecological examination (general) (routine) without abnormal findings: Secondary | ICD-10-CM | POA: Diagnosis not present

## 2020-07-09 DIAGNOSIS — Z86018 Personal history of other benign neoplasm: Secondary | ICD-10-CM | POA: Diagnosis not present

## 2020-07-09 DIAGNOSIS — I10 Essential (primary) hypertension: Secondary | ICD-10-CM | POA: Diagnosis not present

## 2020-07-09 DIAGNOSIS — N6012 Diffuse cystic mastopathy of left breast: Secondary | ICD-10-CM

## 2020-07-09 DIAGNOSIS — N62 Hypertrophy of breast: Secondary | ICD-10-CM

## 2020-07-09 DIAGNOSIS — N6011 Diffuse cystic mastopathy of right breast: Secondary | ICD-10-CM

## 2020-07-09 DIAGNOSIS — Z3169 Encounter for other general counseling and advice on procreation: Secondary | ICD-10-CM

## 2020-07-09 NOTE — Patient Instructions (Signed)
Health Maintenance, Female Adopting a healthy lifestyle and getting preventive care are important in promoting health and wellness. Ask your health care provider about:  The right schedule for you to have regular tests and exams.  Things you can do on your own to prevent diseases and keep yourself healthy. What should I know about diet, weight, and exercise? Eat a healthy diet   Eat a diet that includes plenty of vegetables, fruits, low-fat dairy products, and lean protein.  Do not eat a lot of foods that are high in solid fats, added sugars, or sodium. Maintain a healthy weight Body mass index (BMI) is used to identify weight problems. It estimates body fat based on height and weight. Your health care provider can help determine your BMI and help you achieve or maintain a healthy weight. Get regular exercise Get regular exercise. This is one of the most important things you can do for your health. Most adults should:  Exercise for at least 150 minutes each week. The exercise should increase your heart rate and make you sweat (moderate-intensity exercise).  Do strengthening exercises at least twice a week. This is in addition to the moderate-intensity exercise.  Spend less time sitting. Even light physical activity can be beneficial. Watch cholesterol and blood lipids Have your blood tested for lipids and cholesterol at 39 years of age, then have this test every 5 years. Have your cholesterol levels checked more often if:  Your lipid or cholesterol levels are high.  You are older than 40 years of age.  You are at high risk for heart disease. What should I know about cancer screening? Depending on your health history and family history, you may need to have cancer screening at various ages. This may include screening for:  Breast cancer.  Cervical cancer.  Colorectal cancer.  Skin cancer.  Lung cancer. What should I know about heart disease, diabetes, and high blood  pressure? Blood pressure and heart disease  High blood pressure causes heart disease and increases the risk of stroke. This is more likely to develop in people who have high blood pressure readings, are of African descent, or are overweight.  Have your blood pressure checked: ? Every 3-5 years if you are 18-39 years of age. ? Every year if you are 40 years old or older. Diabetes Have regular diabetes screenings. This checks your fasting blood sugar level. Have the screening done:  Once every three years after age 40 if you are at a normal weight and have a low risk for diabetes.  More often and at a younger age if you are overweight or have a high risk for diabetes. What should I know about preventing infection? Hepatitis B If you have a higher risk for hepatitis B, you should be screened for this virus. Talk with your health care provider to find out if you are at risk for hepatitis B infection. Hepatitis C Testing is recommended for:  Everyone born from 1945 through 1965.  Anyone with known risk factors for hepatitis C. Sexually transmitted infections (STIs)  Get screened for STIs, including gonorrhea and chlamydia, if: ? You are sexually active and are younger than 39 years of age. ? You are older than 39 years of age and your health care provider tells you that you are at risk for this type of infection. ? Your sexual activity has changed since you were last screened, and you are at increased risk for chlamydia or gonorrhea. Ask your health care provider if   you are at risk.  Ask your health care provider about whether you are at high risk for HIV. Your health care provider may recommend a prescription medicine to help prevent HIV infection. If you choose to take medicine to prevent HIV, you should first get tested for HIV. You should then be tested every 3 months for as long as you are taking the medicine. Pregnancy  If you are about to stop having your period (premenopausal) and  you may become pregnant, seek counseling before you get pregnant.  Take 400 to 800 micrograms (mcg) of folic acid every day if you become pregnant.  Ask for birth control (contraception) if you want to prevent pregnancy. Osteoporosis and menopause Osteoporosis is a disease in which the bones lose minerals and strength with aging. This can result in bone fractures. If you are 79 years old or older, or if you are at risk for osteoporosis and fractures, ask your health care provider if you should:  Be screened for bone loss.  Take a calcium or vitamin D supplement to lower your risk of fractures.  Be given hormone replacement therapy (HRT) to treat symptoms of menopause. Follow these instructions at home: Lifestyle  Do not use any products that contain nicotine or tobacco, such as cigarettes, e-cigarettes, and chewing tobacco. If you need help quitting, ask your health care provider.  Do not use street drugs.  Do not share needles.  Ask your health care provider for help if you need support or information about quitting drugs. Alcohol use  Do not drink alcohol if: ? Your health care provider tells you not to drink. ? You are pregnant, may be pregnant, or are planning to become pregnant.  If you drink alcohol: ? Limit how much you use to 0-1 drink a day. ? Limit intake if you are breastfeeding.  Be aware of how much alcohol is in your drink. In the [place] one drink equals one 12 oz bottle of beer (355 mL), one 5 oz glass of wine (148 mL), or one 1 oz glass of hard liquor (44 mL). General instructions  Schedule regular health, dental, and eye exams.  Stay current with your vaccines.  Tell your health care provider if: ? You often feel depressed. ? You have ever been abused or do not feel safe at home. Summary  Adopting a healthy lifestyle and getting preventive care are important in promoting health and wellness.  Follow your health care provider's instructions about healthy  diet, exercising, and getting tested or screened for diseases.  Follow your health care provider's instructions on monitoring your cholesterol and blood pressure. This information is not intended to replace advice given to you by your health care provider. Make sure you discuss any questions you have with your health care provider. Document Revised: 12/06/2018 Document Reviewed: 12/06/2018 Elsevier Patient Education  2020 [employer].     Preparing for Pregnancy If you are considering becoming pregnant, make an appointment to see your regular health care provider to learn how to prepare for a safe and healthy pregnancy (preconception care). During a preconception care visit, your health care provider will:  Do a complete physical exam, including a Pap test.  Take a complete medical history.  Give you information, answer your questions, and help you resolve problems. Preconception checklist Medical history  Tell your health care provider about any current or past medical conditions. Your pregnancy or your ability to become pregnant may be affected by chronic conditions, such as diabetes, chronic  hypertension, and thyroid problems.  Include your family's medical history as well as your partner's medical history.  Tell your health care provider about any history of STIs (sexually transmitted infections).These can affect your pregnancy. In some cases, they can be passed to your baby. Discuss any concerns that you have about STIs.  If indicated, discuss the benefits of genetic testing. This testing will show whether there are any genetic conditions that may be passed from you or your partner to your baby.  Tell your health care provider about: ? Any problems you have had with conception or pregnancy. ? Any medicines you take. These include vitamins, herbal supplements, and over-the-counter medicines. ? Your history of immunizations. Discuss any vaccinations that you may  need. Diet  Ask your health care provider what to include in a healthy diet that has a balance of nutrients. This is especially important when you are pregnant or preparing to become pregnant.  Ask your health care provider to help you reach a healthy weight before pregnancy. ? If you are overweight, you may be at higher risk for certain complications, such as high blood pressure, diabetes, and preterm birth. ? If you are underweight, you are more likely to have a baby who has a low birth weight. Lifestyle, work, and home  Let your health care provider know: ? About any lifestyle habits that you have, such as alcohol use, drug use, or smoking. ? About recreational activities that may put you at risk during pregnancy, such as downhill skiing and certain exercise programs. ? Tell your health care provider about any international travel, especially any travel to places with an active [place] virus outbreak. ? About harmful substances that you may be exposed to at work or at home. These include chemicals, pesticides, radiation, or even litter boxes. ? If you do not feel safe at home. Mental health  Tell your health care provider about: ? Any history of mental health conditions, including feelings of depression, sadness, or anxiety. ? Any medicines that you take for a mental health condition. These include herbs and supplements. Home instructions to prepare for pregnancy Lifestyle   Eat a balanced diet. This includes fresh fruits and vegetables, whole grains, lean meats, low-fat dairy products, healthy fats, and foods that are high in fiber. Ask to meet with a nutritionist or registered dietitian for assistance with meal planning and goals.  Get regular exercise. Try to be active for at least 30 minutes a day on most days of the week. Ask your health care provider which activities are safe during pregnancy.  Do not use any products that contain nicotine or tobacco, such as cigarettes and  e-cigarettes. If you need help quitting, ask your health care provider.  Do not drink alcohol.  Do not take illegal drugs.  Maintain a healthy weight. Ask your health care provider what weight range is right for you. General instructions  Keep an accurate record of your menstrual periods. This makes it easier for your health care provider to determine your baby's due date.  Begin taking prenatal vitamins and folic acid supplements daily as directed by your health care provider.  Manage any chronic conditions, such as high blood pressure and diabetes, as told by your health care provider. This is important. How do I know that I am pregnant? You may be pregnant if you have been sexually active and you miss your period. Symptoms of early pregnancy include:  Mild cramping.  Very light vaginal bleeding (spotting).  Feeling unusually  tired.  Nausea and vomiting (morning sickness). If you have any of these symptoms and you suspect that you might be pregnant, you can take a home pregnancy test. These tests check for a hormone in your urine (human chorionic gonadotropin, or hCG). A woman's body begins to make this hormone during early pregnancy. These tests are very accurate. Wait until at least the first day after you miss your period to take one. If the test shows that you are pregnant (you get a positive result), call your health care provider to make an appointment for prenatal care. What should I do if I become pregnant?      Make an appointment with your health care provider as soon as you suspect you are pregnant.  Do not use any products that contain nicotine, such as cigarettes, chewing tobacco, and e-cigarettes. If you need help quitting, ask your health care provider.  Do not drink alcoholic beverages. Alcohol is related to a number of birth defects.  Avoid toxic odors and chemicals.  You may continue to have sexual intercourse if it does not cause pain or other problems, such  as vaginal bleeding. This information is not intended to replace advice given to you by your health care provider. Make sure you discuss any questions you have with your health care provider. Document Revised: 12/15/2017 Document Reviewed: 07/04/2016 Elsevier Patient Education  [employer].

## 2020-07-09 NOTE — Progress Notes (Signed)
GYNECOLOGY ANNUAL PHYSICAL EXAM PROGRESS NOTE  Subjective:    Gabriela Thomas is a 39 y.o. G66P1001 female who presents for an annual exam. The patient has no complaints today, but the patient would like to discuss pregnancy and any possible risks due to her age and past surgical history of fibroid removal in 2019. The patient is sexually active. The patient wears seatbelts: yes. The patient participates in regular exercise: yes (3-4x weekly). Has the patient ever been transfused or tattooed?: yes. The patient reports that there not domestic violence in her life. Patient is overall doing well and has been working on losing weight through diet and exercise.  Patient is scheduled for labwork tomorrow morning for pre-op for her scheduled breast reduction.    Menstrual History:  Menarche age: 73 Patient's last menstrual period was 07/06/2020. Period Duration (Days): 6-7 Period Pattern: Regular Menstrual Flow: Moderate Menstrual Control: Tampon, Maxi pad Menstrual Control Change Freq (Hours): 2-3 Dysmenorrhea: (!) Mild Dysmenorrhea Symptoms: Cramping   Gynecologic History Contraception: none currently.  Notes she and her partner are desiring to conceive.  History of STI's: None Last Pap: 09/11/2019. Results were: normal.  Denies h/o abnormal pap smears.    OB History  Gravida Para Term Preterm AB Living  1 1 1  0 0 1  SAB TAB Ectopic Multiple Live Births  0 0 0 0 1    # Outcome Date GA Lbr Len/2nd Weight Sex Delivery Anes PTL Lv  1 Term 1999 [redacted]w[redacted]d   F Vag-Spont   LIV    Past Medical History:  Diagnosis Date  . Anxiety   . Asthma    AS A CHILD -NO INHALERS  . Fibroid   . GERD (gastroesophageal reflux disease)    OCC  . Headache   . Hypertension   . Macromastia     Past Surgical History:  Procedure Laterality Date  . LAPAROTOMY N/A 12/12/2018   Procedure: EXPLORATORY LAPAROTOMY, evacuation of hemoperitoneum, aspiration of left adnexal cyst;  Surgeon: Rubie Maid,  MD;  Location: [facility];  Service: Gynecology;  Laterality: N/A;  . MYOMECTOMY N/A 12/11/2018   Procedure: ABDOMINAL MYOMECTOMY;  Surgeon: Rubie Maid, MD;  Location: [facility];  Service: Gynecology;  Laterality: N/A;    Family History  Problem Relation Age of Onset  . Hypertension Mother   . Diabetes Mother   . Congestive Heart Failure Mother   . Hypertension Maternal Aunt   . Hypertension Maternal Uncle   . Hypertension Maternal Grandmother   . Hypertension Maternal Grandfather     Social History   Socioeconomic History  . Marital status: Single    Spouse name: Not on file  . Number of children: Not on file  . Years of education: Not on file  . Highest education level: Not on file  Occupational History  . Not on file  Tobacco Use  . Smoking status: Never Smoker  . Smokeless tobacco: Never Used  Vaping Use  . Vaping Use: Never used  Substance and Sexual Activity  . Alcohol use: Yes    Alcohol/week: 7.0 standard drinks    Types: 7 Glasses of wine per week  . Drug use: No  . Sexual activity: Yes    Birth control/protection: Condom  Other Topics Concern  . Not on file  Social History Narrative  . Not on file   Social Determinants of Health   Financial Resource Strain:   . Difficulty of Paying Living Expenses:   Food Insecurity:   .  Worried About Charity fundraiser in the Last Year:   . Arboriculturist in the Last Year:   Transportation Needs:   . Film/video editor (Medical):   Marland Kitchen Lack of Transportation (Non-Medical):   Physical Activity:   . Days of Exercise per Week:   . Minutes of Exercise per Session:   Stress:   . Feeling of Stress :   Social Connections:   . Frequency of Communication with Friends and Family:   . Frequency of Social Gatherings with Friends and Family:   . Attends Religious Services:   . Active Member of Clubs or Organizations:   . Attends Archivist Meetings:   Marland Kitchen Marital Status:   Intimate Partner Violence:   . Fear  of Current or Ex-Partner:   . Emotionally Abused:   Marland Kitchen Physically Abused:   . Sexually Abused:     Current Outpatient Medications on File Prior to Visit  Medication Sig Dispense Refill  . butalbital-acetaminophen-caffeine (FIORICET) 50-325-40 MG tablet Take 1 tablet by mouth every 6 (six) hours as needed for headache. 15 tablet 0  . metoprolol succinate (TOPROL-XL) 25 MG 24 hr tablet Take 25 mg by mouth daily.    Marland Kitchen olmesartan-hydrochlorothiazide (BENICAR HCT) 40-25 MG tablet Take 1 tablet by mouth daily.    . ondansetron (ZOFRAN) 4 MG tablet Take 1 tablet (4 mg total) by mouth every 8 (eight) hours as needed for nausea or vomiting. 20 tablet 0  . Prenatal Vit-Fe Fumarate-FA (MULTIVITAMIN-PRENATAL) 27-0.8 MG TABS tablet Take 1 tablet by mouth daily at 12 noon.     No current facility-administered medications on file prior to visit.    No Known Allergies    Review of Systems Constitutional: negative for chills, fatigue, fevers and sweats Eyes: negative for irritation, redness and visual disturbance Ears, nose, mouth, throat, and face: negative for hearing loss, nasal congestion, snoring and tinnitus Respiratory: negative for asthma, cough, sputum Cardiovascular: negative for chest pain, dyspnea, exertional chest pressure/discomfort, irregular heart beat, palpitations and syncope Gastrointestinal: negative for abdominal pain, change in bowel habits, nausea and vomiting Genitourinary: negative for abnormal menstrual periods, genital lesions, sexual problems and vaginal discharge, dysuria and urinary incontinence Integument/breast: negative for breast lump, breast tenderness and nipple discharge Hematologic/lymphatic: negative for bleeding and easy bruising Musculoskeletal:negative for back pain and muscle weakness Neurological: negative for dizziness, headaches, vertigo and weakness Endocrine: negative for diabetic symptoms including polydipsia, polyuria and skin  dryness Allergic/Immunologic: negative for hay fever and urticaria        Objective:  Blood pressure (!) 158/99, pulse 81, height 5' 4.5" (1.638 m), weight 177 lb 3.2 oz (80.4 kg), last menstrual period 07/06/2020. Body mass index is 29.95 kg/m.  General Appearance:    Alert, cooperative, no distress, appears stated age  Head:    Normocephalic, without obvious abnormality, atraumatic  Eyes:    PERRL, conjunctiva/corneas clear, EOM's intact, both eyes  Ears:    Normal external ear canals, both ears  Nose:   Nares normal, septum midline, mucosa normal, no drainage or sinus tenderness  Throat:   Lips, mucosa, and tongue normal; teeth and gums normal  Neck:   Supple, symmetrical, trachea midline, no adenopathy; thyroid: no enlargement/tenderness/nodules; no carotid bruit or JVD  Back:     Symmetric, no curvature, ROM normal, no CVA tenderness  Lungs:     Clear to auscultation bilaterally, respirations unlabored  Chest Wall:    No tenderness or deformity   Heart:  Regular rate and rhythm, S1 and S2 normal, no murmur, rub or gallop  Breast Exam:    No tenderness, or nipple abnormality.  Fibrocystic changes noted bilaterally, left > right. Large breasts bilaterally.   Abdomen:     Soft, non-tender, bowel sounds active all four quadrants, no masses, no organomegaly.    Genitalia:    Pelvic:external genitalia normal, vagina without lesions, discharge, or tenderness, rectovaginal septum  normal. Cervix normal in appearance, no cervical motion tenderness, no adnexal masses or tenderness.  Uterus normal size, shape, mobile, regular contours, nontender.  Rectal:    Normal external sphincter.  No hemorrhoids appreciated. Internal exam not done.   Extremities:   Extremities normal, atraumatic, no cyanosis or edema  Pulses:   2+ and symmetric all extremities  Skin:   Skin color, texture, turgor normal, no rashes or lesions  Lymph nodes:   Cervical, supraclavicular, and axillary nodes normal   Neurologic:   CNII-XII intact, normal strength, sensation and reflexes throughout   .  Labs:  Lab Results  Component Value Date   WBC 4.1 05/22/2020   HGB 14.8 05/22/2020   HCT 42.3 05/22/2020   MCV 92.0 05/22/2020   PLT 172 05/22/2020    Lab Results  Component Value Date   CREATININE 0.97 05/22/2020   BUN 18 05/22/2020   NA 137 05/22/2020   K 3.3 (L) 05/22/2020   CL 101 05/22/2020   CO2 26 05/22/2020    Lab Results  Component Value Date   ALT 21 05/22/2020   AST 26 05/22/2020   ALKPHOS 58 05/22/2020   BILITOT 0.6 05/22/2020    No results found for: TSH   Assessment:   1. Encounter for well woman exam with routine gynecological exam   2. History of uterine fibroid   3. Fibrocystic breast changes of both breasts   4. Essential hypertension   5. Large breasts   6. Encounter for preconception consultation      Plan:    - Blood tests: No labs. Plans to have labs done tomorrow during pre-op. . - Breast self exam technique reviewed and patient encouraged to perform self-exam monthly. - Contraception: none.  Patient and partner desiring conception. Partner age 27. Advised that she cound attempt conception 1-2 months after healing from breast surgery. If no conception occurs within 6 months, would advise to return for further evaluation (including ultrasound as patient with prior h/o fibroids s/p myomectomy), and possible use of induction medications based on age. Discussed timed coitus, fertility tracking with phone app. Has initiated prenatal vitamins. Advised that she would be considered a high risk pregnancy.  - Pap smear up to date. Continue q 3 year screening. Next due in 2023.  - Fibrocystic breast changes benign, asymptomatic. May change with impending breast surgery. Will be due for breast screenings starting next year.  - Scheduled for upcoming breast reduction surgery soon. Discussed expectations post-operatively.  - Hypertension, currently on meds. Thinks it  was due to her recent meal (had bacon this morning, previously had been cutting carbs and salt). Reviewed last vitals at pre-op visit last week, was normal. Encouraged patient to return to limiting sodium intake as this can help better control her BPs.  - Follow in 1 year for annual exam, or sooner if pregnancy occurs.     Upstream - 07/09/20 1027      Pregnancy Intention Screening   Does the patient want to become pregnant in the next year? Ok Either [employer]    Does  the patient's partner want to become pregnant in the next year? Yes    Would the patient like to discuss contraceptive options today? No      Contraception Wrap Up   Current Method Female Condom    End Method Female Condom    Contraception Counseling Provided Yes          The pregnancy intention screening data noted above was reviewed. Potential methods of contraception were discussed. The patient elected to proceed with Pregnant/Seeking Pregnancy.   Rubie Maid, MD [facility]

## 2020-07-09 NOTE — Progress Notes (Signed)
Pt present for annual exam. Pt stated that she was doing well. Pt would like to discuss conceiving a baby. No other issues.

## 2020-07-10 ENCOUNTER — Encounter (HOSPITAL_BASED_OUTPATIENT_CLINIC_OR_DEPARTMENT_OTHER)
Admission: RE | Admit: 2020-07-10 | Discharge: 2020-07-10 | Disposition: A | Discharge status: Home / Self Care | Payer: BC Managed Care – PPO | Source: Ambulatory Visit | Attending: Plastic Surgery | Admitting: Plastic Surgery

## 2020-07-10 ENCOUNTER — Other Ambulatory Visit (HOSPITAL_COMMUNITY)
Admission: RE | Admit: 2020-07-10 | Discharge: 2020-07-10 | Disposition: A | Discharge status: Home / Self Care | Payer: BC Managed Care – PPO | Source: Ambulatory Visit | Attending: Plastic Surgery | Admitting: Plastic Surgery

## 2020-07-10 DIAGNOSIS — Z719 Counseling, unspecified: Secondary | ICD-10-CM

## 2020-07-10 DIAGNOSIS — Z01818 Encounter for other preprocedural examination: Secondary | ICD-10-CM | POA: Diagnosis not present

## 2020-07-10 DIAGNOSIS — Z20822 Contact with and (suspected) exposure to covid-19: Secondary | ICD-10-CM | POA: Diagnosis not present

## 2020-07-10 LAB — BASIC METABOLIC PANEL
Anion gap: 9 (ref 5–15)
BUN: 15 mg/dL (ref 6–20)
CO2: 27 mmol/L (ref 22–32)
Calcium: 9.3 mg/dL (ref 8.9–10.3)
Chloride: 104 mmol/L (ref 98–111)
Creatinine, Ser: 1.03 mg/dL — ABNORMAL HIGH (ref 0.44–1.00)
GFR calc Af Amer: >60 mL/min (ref >60)
GFR calc non Af Amer: >60 mL/min (ref >60)
Glucose, Bld: 106 mg/dL — ABNORMAL HIGH (ref 70–99)
Potassium: 3.7 mmol/L (ref 3.5–5.1)
Sodium: 140 mmol/L (ref 135–145)

## 2020-07-10 LAB — SARS CORONAVIRUS 2 (TAT 6-24 HRS): SARS Coronavirus 2: NEGATIVE

## 2020-07-10 LAB — POCT PREGNANCY, URINE: Preg Test, Ur: NEGATIVE

## 2020-07-14 ENCOUNTER — Other Ambulatory Visit: Payer: Self-pay

## 2020-07-14 ENCOUNTER — Ambulatory Visit (HOSPITAL_BASED_OUTPATIENT_CLINIC_OR_DEPARTMENT_OTHER): Payer: BC Managed Care – PPO | Admitting: Anesthesiology

## 2020-07-14 ENCOUNTER — Encounter (HOSPITAL_BASED_OUTPATIENT_CLINIC_OR_DEPARTMENT_OTHER)
Admission: RE | Disposition: A | Discharge status: Home / Self Care | Payer: Self-pay | Source: Ambulatory Visit | Attending: Plastic Surgery

## 2020-07-14 ENCOUNTER — Ambulatory Visit (HOSPITAL_BASED_OUTPATIENT_CLINIC_OR_DEPARTMENT_OTHER)
Admission: RE | Admit: 2020-07-14 | Discharge: 2020-07-14 | Disposition: A | Discharge status: Home / Self Care | Payer: BC Managed Care – PPO | Source: Ambulatory Visit | Attending: Plastic Surgery | Admitting: Plastic Surgery

## 2020-07-14 ENCOUNTER — Encounter (HOSPITAL_BASED_OUTPATIENT_CLINIC_OR_DEPARTMENT_OTHER): Payer: Self-pay | Admitting: Plastic Surgery

## 2020-07-14 DIAGNOSIS — M545 Low back pain: Secondary | ICD-10-CM

## 2020-07-14 DIAGNOSIS — Q831 Accessory breast: Secondary | ICD-10-CM | POA: Insufficient documentation

## 2020-07-14 DIAGNOSIS — Z8709 Personal history of other diseases of the respiratory system: Secondary | ICD-10-CM | POA: Diagnosis not present

## 2020-07-14 DIAGNOSIS — F419 Anxiety disorder, unspecified: Secondary | ICD-10-CM | POA: Insufficient documentation

## 2020-07-14 DIAGNOSIS — M546 Pain in thoracic spine: Secondary | ICD-10-CM

## 2020-07-14 DIAGNOSIS — M4004 Postural kyphosis, thoracic region: Secondary | ICD-10-CM

## 2020-07-14 DIAGNOSIS — I10 Essential (primary) hypertension: Secondary | ICD-10-CM | POA: Insufficient documentation

## 2020-07-14 DIAGNOSIS — N62 Hypertrophy of breast: Secondary | ICD-10-CM | POA: Insufficient documentation

## 2020-07-14 HISTORY — DX: Hypertrophy of breast: N62

## 2020-07-14 HISTORY — PX: BREAST REDUCTION SURGERY: SHX8

## 2020-07-14 HISTORY — DX: Other specified postprocedural states: R11.2

## 2020-07-14 HISTORY — DX: Other specified postprocedural states: Z98.890

## 2020-07-14 SURGERY — MAMMOPLASTY, REDUCTION
Anesthesia: General | Site: Breast | Laterality: Bilateral

## 2020-07-14 MED ORDER — BUPIVACAINE HCL (PF) 0.25 % IJ SOLN
INTRAMUSCULAR | Status: AC
  Filled 2020-07-14: qty 30

## 2020-07-14 MED ORDER — FENTANYL CITRATE (PF) 100 MCG/2ML IJ SOLN
25.0000 ug | INTRAMUSCULAR | Status: DC | PRN
  Administered 2020-07-14 (×2): 50 ug via INTRAVENOUS

## 2020-07-14 MED ORDER — ACETAMINOPHEN 500 MG PO TABS
1000.0000 mg | ORAL_TABLET | Freq: Once | ORAL | Status: AC
  Administered 2020-07-14: 1000 mg via ORAL

## 2020-07-14 MED ORDER — EPHEDRINE SULFATE 50 MG/ML IJ SOLN
INTRAMUSCULAR | Status: DC | PRN
  Administered 2020-07-14: 10 mg via INTRAVENOUS

## 2020-07-14 MED ORDER — LACTATED RINGERS IV SOLN: INTRAVENOUS | Status: DC | PRN

## 2020-07-14 MED ORDER — ACETAMINOPHEN 500 MG PO TABS
ORAL_TABLET | ORAL | Status: AC
  Filled 2020-07-14: qty 2

## 2020-07-14 MED ORDER — PHENYLEPHRINE HCL (PRESSORS) 10 MG/ML IV SOLN
INTRAVENOUS | Status: DC | PRN
Start: 2020-07-14 — End: 2020-07-14
  Administered 2020-07-14: 80 ug via INTRAVENOUS
  Administered 2020-07-14: 120 ug via INTRAVENOUS
  Administered 2020-07-14: 80 ug via INTRAVENOUS

## 2020-07-14 MED ORDER — FAMOTIDINE IN NACL 20-0.9 MG/50ML-% IV SOLN
20.0000 mg | Freq: Two times a day (BID) | INTRAVENOUS | Status: DC
  Administered 2020-07-14: 20 mg via INTRAVENOUS

## 2020-07-14 MED ORDER — ROCURONIUM BROMIDE 100 MG/10ML IV SOLN
INTRAVENOUS | Status: DC | PRN
  Administered 2020-07-14: 50 mg via INTRAVENOUS

## 2020-07-14 MED ORDER — LACTATED RINGERS IV SOLN: INTRAVENOUS | Status: DC

## 2020-07-14 MED ORDER — ONDANSETRON HCL 4 MG/2ML IJ SOLN
INTRAMUSCULAR | Status: AC
  Filled 2020-07-14: qty 2

## 2020-07-14 MED ORDER — LIDOCAINE-EPINEPHRINE 1 %-1:100000 IJ SOLN
INTRAMUSCULAR | Status: AC
  Filled 2020-07-14: qty 1

## 2020-07-14 MED ORDER — METOCLOPRAMIDE HCL 5 MG/ML IJ SOLN
10.0000 mg | Freq: Once | INTRAMUSCULAR | Status: AC
  Administered 2020-07-14: 10 mg via INTRAVENOUS

## 2020-07-14 MED ORDER — PROPOFOL 500 MG/50ML IV EMUL
INTRAVENOUS | Status: DC | PRN
Start: 2020-07-14 — End: 2020-07-14
  Administered 2020-07-14: 30 ug/kg/min via INTRAVENOUS

## 2020-07-14 MED ORDER — SCOPOLAMINE 1 MG/3DAYS TD PT72
1.0000 | MEDICATED_PATCH | Freq: Once | TRANSDERMAL | Status: DC
  Administered 2020-07-14: 1.5 mg via TRANSDERMAL

## 2020-07-14 MED ORDER — OXYCODONE HCL 5 MG/5ML PO SOLN: 5.0000 mg | Freq: Once | ORAL | Status: AC | PRN

## 2020-07-14 MED ORDER — LACTATED RINGERS IV SOLN
INTRAVENOUS | Status: DC | PRN
  Administered 2020-07-14 (×2): 700 mL

## 2020-07-14 MED ORDER — FENTANYL CITRATE (PF) 100 MCG/2ML IJ SOLN
INTRAMUSCULAR | Status: AC
  Filled 2020-07-14: qty 2

## 2020-07-14 MED ORDER — SCOPOLAMINE 1 MG/3DAYS TD PT72
MEDICATED_PATCH | TRANSDERMAL | Status: AC
  Filled 2020-07-14: qty 1

## 2020-07-14 MED ORDER — PHENYLEPHRINE HCL (PRESSORS) 10 MG/ML IV SOLN
INTRAVENOUS | Status: DC | PRN
Start: 2020-07-14 — End: 2020-07-14
  Administered 2020-07-14: 120 ug via INTRAVENOUS

## 2020-07-14 MED ORDER — MIDAZOLAM HCL 5 MG/5ML IJ SOLN
INTRAMUSCULAR | Status: DC | PRN
  Administered 2020-07-14: 2 mg via INTRAVENOUS

## 2020-07-14 MED ORDER — EPINEPHRINE PF 1 MG/ML IJ SOLN
INTRAMUSCULAR | Status: AC
  Filled 2020-07-14: qty 1

## 2020-07-14 MED ORDER — CEFAZOLIN SODIUM-DEXTROSE 2-4 GM/100ML-% IV SOLN
2.0000 g | INTRAVENOUS | Status: AC
  Administered 2020-07-14: 2 g via INTRAVENOUS

## 2020-07-14 MED ORDER — CEFAZOLIN SODIUM-DEXTROSE 2-4 GM/100ML-% IV SOLN
INTRAVENOUS | Status: AC
  Filled 2020-07-14: qty 100

## 2020-07-14 MED ORDER — PROPOFOL 10 MG/ML IV BOLUS
INTRAVENOUS | Status: DC | PRN
  Administered 2020-07-14: 150 mg via INTRAVENOUS

## 2020-07-14 MED ORDER — LIDOCAINE HCL (CARDIAC) PF 100 MG/5ML IV SOSY
PREFILLED_SYRINGE | INTRAVENOUS | Status: DC | PRN
  Administered 2020-07-14: 80 mg via INTRAVENOUS

## 2020-07-14 MED ORDER — FENTANYL CITRATE (PF) 100 MCG/2ML IJ SOLN
INTRAMUSCULAR | Status: DC | PRN
  Administered 2020-07-14: 50 ug via INTRAVENOUS
  Administered 2020-07-14: 100 ug via INTRAVENOUS

## 2020-07-14 MED ORDER — OXYCODONE HCL 5 MG PO TABS
5.0000 mg | ORAL_TABLET | Freq: Once | ORAL | Status: AC | PRN
  Administered 2020-07-14: 5 mg via ORAL

## 2020-07-14 MED ORDER — DEXAMETHASONE SODIUM PHOSPHATE 4 MG/ML IJ SOLN
INTRAMUSCULAR | Status: DC | PRN
  Administered 2020-07-14: 10 mg via INTRAVENOUS

## 2020-07-14 MED ORDER — FAMOTIDINE IN NACL 20-0.9 MG/50ML-% IV SOLN
INTRAVENOUS | Status: AC
  Filled 2020-07-14: qty 50

## 2020-07-14 MED ORDER — PROMETHAZINE HCL 25 MG/ML IJ SOLN: 6.2500 mg | INTRAMUSCULAR | Status: DC | PRN

## 2020-07-14 MED ORDER — MIDAZOLAM HCL 2 MG/2ML IJ SOLN
INTRAMUSCULAR | Status: AC
  Filled 2020-07-14: qty 2

## 2020-07-14 MED ORDER — ROCURONIUM BROMIDE 10 MG/ML (PF) SYRINGE
PREFILLED_SYRINGE | INTRAVENOUS | Status: AC
  Filled 2020-07-14: qty 10

## 2020-07-14 MED ORDER — OXYCODONE HCL 5 MG PO TABS
ORAL_TABLET | ORAL | Status: AC
  Filled 2020-07-14: qty 1

## 2020-07-14 MED ORDER — PROPOFOL 500 MG/50ML IV EMUL
INTRAVENOUS | Status: AC
  Filled 2020-07-14: qty 50

## 2020-07-14 MED ORDER — DEXAMETHASONE SODIUM PHOSPHATE 10 MG/ML IJ SOLN
INTRAMUSCULAR | Status: AC
  Filled 2020-07-14: qty 1

## 2020-07-14 MED ORDER — METOCLOPRAMIDE HCL 5 MG/ML IJ SOLN
INTRAMUSCULAR | Status: AC
  Filled 2020-07-14: qty 2

## 2020-07-14 SURGICAL SUPPLY — 75 items
BAG DECANTER FOR FLEXI CONT (MISCELLANEOUS) IMPLANT
BENZOIN TINCTURE PRP APPL 2/3 (GAUZE/BANDAGES/DRESSINGS) ×6 IMPLANT
BLADE SURG 10 STRL SS (BLADE) ×6 IMPLANT
BLADE SURG 15 STRL LF DISP TIS (BLADE) IMPLANT
BLADE SURG 15 STRL SS (BLADE)
BNDG ELASTIC 6X5.8 VLCR STR LF (GAUZE/BANDAGES/DRESSINGS) ×9 IMPLANT
BNDG GAUZE ELAST 4 BULKY (GAUZE/BANDAGES/DRESSINGS) ×6 IMPLANT
CANISTER SUCT 1200ML W/VALVE (MISCELLANEOUS) ×3 IMPLANT
CHLORAPREP W/TINT 26 (MISCELLANEOUS) ×6 IMPLANT
CLIP VESOCCLUDE MED 6/CT (CLIP) IMPLANT
COVER BACK TABLE 60X90IN (DRAPES) ×3 IMPLANT
COVER MAYO STAND STRL (DRAPES) ×3 IMPLANT
COVER WAND RF STERILE (DRAPES) IMPLANT
DECANTER SPIKE VIAL GLASS SM (MISCELLANEOUS) IMPLANT
DRAIN CHANNEL 15F RND FF W/TCR (WOUND CARE) IMPLANT
DRAPE LAPAROSCOPIC ABDOMINAL (DRAPES) ×3 IMPLANT
DRAPE UTILITY XL STRL (DRAPES) ×3 IMPLANT
DRSG PAD ABDOMINAL 8X10 ST (GAUZE/BANDAGES/DRESSINGS) ×12 IMPLANT
ELECT REM PT RETURN 9FT ADLT (ELECTROSURGICAL) ×3
ELECTRODE REM PT RTRN 9FT ADLT (ELECTROSURGICAL) ×1 IMPLANT
EVACUATOR SILICONE 100CC (DRAIN) IMPLANT
GAUZE SPONGE 4X4 12PLY STRL (GAUZE/BANDAGES/DRESSINGS) ×6 IMPLANT
GAUZE XEROFORM 5X9 LF (GAUZE/BANDAGES/DRESSINGS) IMPLANT
GLOVE BIO SURGEON STRL SZ 6.5 (GLOVE) IMPLANT
GLOVE BIO SURGEON STRL SZ7.5 (GLOVE) IMPLANT
GLOVE BIO SURGEONS STRL SZ 6.5 (GLOVE)
GLOVE BIOGEL M STRL SZ7.5 (GLOVE) ×3 IMPLANT
GLOVE BIOGEL PI IND STRL 6.5 (GLOVE) ×1 IMPLANT
GLOVE BIOGEL PI IND STRL 7.0 (GLOVE) ×1 IMPLANT
GLOVE BIOGEL PI IND STRL 8 (GLOVE) IMPLANT
GLOVE BIOGEL PI INDICATOR 6.5 (GLOVE) ×2
GLOVE BIOGEL PI INDICATOR 7.0 (GLOVE) ×2
GLOVE BIOGEL PI INDICATOR 8 (GLOVE)
GLOVE ECLIPSE 6.5 STRL STRAW (GLOVE) ×3 IMPLANT
GOWN STRL REUS W/ TWL LRG LVL3 (GOWN DISPOSABLE) ×2 IMPLANT
GOWN STRL REUS W/TWL LRG LVL3 (GOWN DISPOSABLE) ×6
LINER CANISTER 1000CC FLEX (MISCELLANEOUS) ×3 IMPLANT
MARKER SKIN DUAL TIP RULER LAB (MISCELLANEOUS) IMPLANT
NDL SAFETY ECLIPSE 18X1.5 (NEEDLE) ×1 IMPLANT
NEEDLE FILTER BLUNT 18X 1/2SAF (NEEDLE) ×2
NEEDLE FILTER BLUNT 18X1 1/2 (NEEDLE) ×1 IMPLANT
NEEDLE HYPO 18GX1.5 SHARP (NEEDLE) ×3
NEEDLE HYPO 25X1 1.5 SAFETY (NEEDLE) IMPLANT
NEEDLE SPNL 18GX3.5 QUINCKE PK (NEEDLE) ×3 IMPLANT
NS IRRIG 1000ML POUR BTL (IV SOLUTION) ×3 IMPLANT
PACK BASIN DAY SURGERY FS (CUSTOM PROCEDURE TRAY) ×3 IMPLANT
PENCIL SMOKE EVACUATOR (MISCELLANEOUS) ×3 IMPLANT
PIN SAFETY STERILE (MISCELLANEOUS) IMPLANT
SHEET MEDIUM DRAPE 40X70 STRL (DRAPES) IMPLANT
SLEEVE SCD COMPRESS KNEE MED (MISCELLANEOUS) ×3 IMPLANT
SPONGE LAP 18X18 RF (DISPOSABLE) ×9 IMPLANT
STAPLER INSORB 30 2030 C-SECTI (MISCELLANEOUS) ×6 IMPLANT
STAPLER VISISTAT 35W (STAPLE) ×6 IMPLANT
STRIP SUTURE WOUND CLOSURE 1/2 (MISCELLANEOUS) ×12 IMPLANT
SUT CHROMIC 4 0 PS 2 18 (SUTURE) IMPLANT
SUT ETHILON 2 0 FS 18 (SUTURE) IMPLANT
SUT ETHILON 3 0 PS 1 (SUTURE) IMPLANT
SUT MNCRL AB 4-0 PS2 18 (SUTURE) ×6 IMPLANT
SUT PDS 3-0 CT2 (SUTURE) ×6
SUT PDS II 3-0 CT2 27 ABS (SUTURE) ×2 IMPLANT
SUT VIC AB 3-0 PS1 18 (SUTURE)
SUT VIC AB 3-0 PS1 18XBRD (SUTURE) IMPLANT
SUT VLOC 90 P-14 23 (SUTURE) ×9 IMPLANT
SYR 50ML LL SCALE MARK (SYRINGE) ×6 IMPLANT
SYR BULB IRRIG 60ML STRL (SYRINGE) ×3 IMPLANT
SYR CONTROL 10ML LL (SYRINGE) IMPLANT
TAPE MEASURE VINYL STERILE (MISCELLANEOUS) IMPLANT
TOWEL GREEN STERILE FF (TOWEL DISPOSABLE) ×6 IMPLANT
TRAY FOLEY W/BAG SLVR 14FR LF (SET/KITS/TRAYS/PACK) IMPLANT
TUBE CONNECTING 20'X1/4 (TUBING) ×1
TUBE CONNECTING 20X1/4 (TUBING) ×2 IMPLANT
TUBING INFILTRATION IT-10001 (TUBING) ×3 IMPLANT
TUBING SET GRADUATE ASPIR 12FT (MISCELLANEOUS) ×3 IMPLANT
UNDERPAD 30X36 HEAVY ABSORB (UNDERPADS AND DIAPERS) ×6 IMPLANT
YANKAUER SUCT BULB TIP NO VENT (SUCTIONS) ×3 IMPLANT

## 2020-07-14 NOTE — Brief Op Note (Signed)
07/14/2020  2:43 PM  PATIENT:  Gabriela Thomas  39 y.o. female  PRE-OPERATIVE DIAGNOSIS:  macromastia  POST-OPERATIVE DIAGNOSIS:  macromastia  PROCEDURE:  Procedure(s): MAMMARY REDUCTION  (BREAST) (Bilateral)  SURGEON:  Surgeon(s) and Role:    * Karys Meckley, Steffanie Dunn, MD - Primary  PHYSICIAN ASSISTANT: None  ASSISTANTS: none   ANESTHESIA:   general  EBL:  50   BLOOD ADMINISTERED:none  DRAINS: none   LOCAL MEDICATIONS USED:  MARCAINE     SPECIMEN:  Source of Specimen:  r and l breast tissue, r and l axillary breast tissue  DISPOSITION OF SPECIMEN:  PATHOLOGY  COUNTS:  YES  TOURNIQUET:  * No tourniquets in log *  DICTATION: .Dragon Dictation  PLAN OF CARE: Discharge to home after PACU  PATIENT DISPOSITION:  PACU - hemodynamically stable.   Delay start of Pharmacological VTE agent (>24hrs) due to surgical blood loss or risk of bleeding: not applicable

## 2020-07-14 NOTE — Anesthesia Postprocedure Evaluation (Signed)
Anesthesia Post Note  Patient: Gabriela Thomas  Procedure(s) Performed: MAMMARY REDUCTION  (BREAST) (Bilateral Breast)     Patient location during evaluation: PACU Anesthesia Type: General Level of consciousness: awake and alert and oriented Pain management: pain level controlled Vital Signs Assessment: post-procedure vital signs reviewed and stable Respiratory status: spontaneous breathing, nonlabored ventilation and respiratory function stable Cardiovascular status: blood pressure returned to baseline Postop Assessment: no apparent nausea or vomiting Anesthetic complications: no   No complications documented.  Last Vitals:  Vitals:   07/14/20 1516 07/14/20 1530  BP: 139/89 129/86  Pulse: 79 88  Resp: 12 19  Temp:    SpO2: 100% 100%    Last Pain:  Vitals:   07/14/20 1516  TempSrc:   PainSc: Jennette

## 2020-07-14 NOTE — Interval H&P Note (Signed)
History and Physical Interval Note:  07/14/2020 11:45 AM  Gabriela Thomas  has presented today for surgery, with the diagnosis of macromastia.  The various methods of treatment have been discussed with the patient and family. After consideration of risks, benefits and other options for treatment, the patient has consented to  Procedure(s): BREAST REDUCTION WITH LIPOSUCTION (Bilateral) as a surgical intervention.  The patient's history has been reviewed, patient examined, no change in status, stable for surgery.  I have reviewed the patient's chart and labs.  Questions were answered to the patient's satisfaction.     Cindra Presume

## 2020-07-14 NOTE — Transfer of Care (Signed)
Immediate Anesthesia Transfer of Care Note  Patient: Gabriela Thomas  Procedure(s) Performed: MAMMARY REDUCTION  (BREAST) (Bilateral Breast)  Patient Location: PACU  Anesthesia Type:General  Level of Consciousness: awake, alert  and oriented  Airway & Oxygen Therapy: Patient Spontanous Breathing and Patient connected to face mask oxygen  Post-op Assessment: Report given to RN and Post -op Vital signs reviewed and stable  Post vital signs: Reviewed and stable  Last Vitals:  Vitals Value Taken Time  BP    Temp    Pulse    Resp    SpO2      Last Pain:  Vitals:   07/14/20 1110  TempSrc: Oral  PainSc: 0-No pain         Complications: No complications documented.

## 2020-07-14 NOTE — Op Note (Signed)
Operative Note   DATE OF OPERATION: 07/14/2020  LOCATION: [facility]   SURGICAL DEPARTMENT: Plastic Surgery  PREOPERATIVE DIAGNOSES:  1.  Bilateral symptomatic macromastia. 2.  Accessory axillary breast tissue  POSTOPERATIVE DIAGNOSES:  same  PROCEDURE:  1.  Bilateral breast reduction with superomedial pedicle. 2.  Bilateral excision of accessory axillary breast tissue through separate incision  SURGEON: Talmadge Coventry, MD  ASSISTANT: None  ANESTHESIA: General.  COMPLICATIONS: None.   INDICATIONS FOR PROCEDURE:  The patient, Gabriela Thomas is a 39 y.o. female born on 1981/07/21, is here for treatment of bilateral symptomatic macromastia and accessory breast tissue in the axilla. MRN: [id number]  CONSENT:  Informed consent was obtained directly from the patient. Risks, benefits and alternatives were fully discussed. Specific risks including but not limited to bleeding, infection, hematoma, seroma, scarring, pain, infection, contracture, asymmetry, wound healing problems, and need for further surgery were all discussed. The patient did have an ample opportunity to have questions answered to satisfaction.   DESCRIPTION OF PROCEDURE:  The patient was marked preoperatively for a Wise pattern skin excision.  The patient was taken to the operating room. SCDs were placed and antibiotics were given. General anesthesia was administered.The patient's operative site was prepped and draped in a sterile fashion. A time out was performed and all information was confirmed to be correct.  Right Breast: The breast was infiltrated with tumescent solution to help with hemostasis.  The nipple was marked with a cookie cutter.  A superomedial pedicle was drawn out with the base of at least 8 cm in size.  A breast tourniquet was then applied and the pedicle was de-epithelialized.  Breast tourniquet was then let down and all incisions were made with a 10 blade.  The pedicle was then  isolated down to the chest wall with cautery and the excision was performed removing tissue primarily inferiorly and laterally.  Hemostasis was obtained and the wound was stapled closed.  Left breast:  The breast was infiltrated with tumescent solution to help with hemostasis.  The nipple was marked with a cookie cutter.  A superomedial pedicle was drawn out with the base of at least 8 cm in size.  A breast tourniquet was then applied and the pedicle was de-epithelialized.  Breast tourniquet was then let down and all incisions were made with a 10 blade.  The pedicle was then isolated down to the chest wall with cautery and the excision was performed removing tissue primarily inferiorly and laterally.  Hemostasis was obtained and the wound was stapled closed.  Patient was then set up to check for size and symmetry.  Minor modifications were made.  This resulted in a total of 1146 g removed from the right side and 1128 g removed from the left side.  The inframammary incision was closed with a combination of buried in-sorb staples and a running 3-0 Quill suture.  The vertical and periareolar limbs were closed with interrupted buried 4-0 Monocryl and a running 4-0 Quill suture.    I then turned my attention to the axilla.  The accessory breast tissue was identified on both sides and elliptical excision was planned.  Tumescent was infiltrated.  Tissue was excised with a 10-blade and cautery.  Wounds closed with buried Insorb staples and running 3-0 subcuticular V-Lock.  Steri-Strips were then applied along with a soft dressing and Ace wrap.  The patient tolerated the procedure well.  There were no complications. The patient was allowed to wake from anesthesia, extubated  and taken to the recovery room in satisfactory condition.  I was present for the entire procedure.

## 2020-07-14 NOTE — Discharge Instructions (Signed)
Activity As tolerated: NO showers for 3 days No heavy activities  Diet: Regular  Wound Care: Keep dressing clean & dry for 3 days.  Keep wrap applied with compression as much as possible.    Do not change dressings for 3 days unless soiled.  Can change if needed but make sure to reapply wrap. After three days can remove wrap and shower.  Then reapply dressings if needed and continue compression with wrap or soft sports bra. Call doctor if any unusual problems occur such as pain, excessive bleeding, unrelieved nausea/vomiting, fever &/or chills  Follow-up appointment: Scheduled for next week.   Post Anesthesia Home Care Instructions  Activity: Get plenty of rest for the remainder of the day. A responsible individual must stay with you for 24 hours following the procedure.  For the next 24 hours, DO NOT: -Drive a car -Paediatric nurse -Drink alcoholic beverages -Take any medication unless instructed by your physician -Make any legal decisions or sign important papers.  Meals: Start with liquid foods such as gelatin or soup. Progress to regular foods as tolerated. Avoid greasy, spicy, heavy foods. If nausea and/or vomiting occur, drink only clear liquids until the nausea and/or vomiting subsides. Call your physician if vomiting continues.  Special Instructions/Symptoms: Your throat may feel dry or sore from the anesthesia or the breathing tube placed in your throat during surgery. If this causes discomfort, gargle with warm salt water. The discomfort should disappear within 24 hours.  If you had a scopolamine patch placed behind your ear for the management of post- operative nausea and/or vomiting:  1. The medication in the patch is effective for 72 hours, after which it should be removed.  Wrap patch in a tissue and discard in the trash. Wash hands thoroughly with soap and water. 2. You may remove the patch earlier than 72 hours if you experience unpleasant side effects which may  include dry mouth, dizziness or visual disturbances. 3. Avoid touching the patch. Wash your hands with soap and water after contact with the patch.     Next dose of Tylenol can be given at 5:30pm if needed.

## 2020-07-14 NOTE — Anesthesia Preprocedure Evaluation (Addendum)
Anesthesia Evaluation  Patient identified by MRN, date of birth, ID band Patient awake    Reviewed: Allergy & Precautions, NPO status , Patient's Chart, lab work & pertinent test results, reviewed documented beta blocker date and time   History of Anesthesia Complications (+) PONV and history of anesthetic complications  Airway Mallampati: II  TM Distance: >3 FB Neck ROM: Full    Dental  (+) Chipped,    Pulmonary neg pulmonary ROS,    Pulmonary exam normal        Cardiovascular hypertension, Pt. on home beta blockers and Pt. on medications Normal cardiovascular exam     Neuro/Psych  Headaches, Anxiety    GI/Hepatic Neg liver ROS, GERD  Controlled,  Endo/Other  negative endocrine ROS  Renal/GU negative Renal ROS  negative genitourinary   Musculoskeletal negative musculoskeletal ROS (+)   Abdominal   Peds  Hematology negative hematology ROS (+)   Anesthesia Other Findings Macromastia  Reproductive/Obstetrics negative OB ROS                            Anesthesia Physical Anesthesia Plan  ASA: II  Anesthesia Plan: General   Post-op Pain Management:    Induction: Intravenous  PONV Risk Score and Plan: 4 or greater and Treatment may vary due to age or medical condition, Ondansetron, Dexamethasone, Midazolam and Scopolamine patch - Pre-op  Airway Management Planned: Oral ETT  Additional Equipment: None  Intra-op Plan:   Post-operative Plan: Extubation in OR  Informed Consent: I have reviewed the patients History and Physical, chart, labs and discussed the procedure including the risks, benefits and alternatives for the proposed anesthesia with the patient or authorized representative who has indicated his/her understanding and acceptance.     Dental advisory given  Plan Discussed with: CRNA  Anesthesia Plan Comments:        Anesthesia Quick Evaluation

## 2020-07-14 NOTE — Anesthesia Procedure Notes (Signed)
Procedure Name: Intubation Performed by: Mindy Gali M, CRNA Pre-anesthesia Checklist: Patient identified, Emergency Drugs available, Suction available and Patient being monitored Patient Re-evaluated:Patient Re-evaluated prior to induction Oxygen Delivery Method: Circle system utilized Preoxygenation: Pre-oxygenation with 100% oxygen Induction Type: IV induction Ventilation: Mask ventilation without difficulty Laryngoscope Size: Mac and 3 Grade View: Grade I Tube type: Oral Tube size: 7.0 mm Number of attempts: 1 Airway Equipment and Method: Stylet and Oral airway Placement Confirmation: ETT inserted through vocal cords under direct vision,  positive ETCO2 and breath sounds checked- equal and bilateral Secured at: 21 cm Tube secured with: Tape Dental Injury: Teeth and Oropharynx as per pre-operative assessment        

## 2020-07-15 ENCOUNTER — Encounter (HOSPITAL_BASED_OUTPATIENT_CLINIC_OR_DEPARTMENT_OTHER): Payer: Self-pay | Admitting: Plastic Surgery

## 2020-07-16 ENCOUNTER — Telehealth: Payer: Self-pay | Admitting: Plastic Surgery

## 2020-07-16 LAB — SURGICAL PATHOLOGY

## 2020-07-16 NOTE — Telephone Encounter (Signed)
Patient said she is having some bleeding under arms, breasts, nipples and it's coming through the bandages a little. She wanted to know if this is normal. Please call to advise.

## 2020-07-16 NOTE — Telephone Encounter (Signed)
Patient is doing well, reports she has been resting. She noticed some drainage on the gauze on her breast and wanted to confirm it was normal. Advised her it was normal as sometimes there is some blood that oozes from the incisions. Instructed her to call with further questions or concerns.

## 2020-07-17 ENCOUNTER — Other Ambulatory Visit (HOSPITAL_COMMUNITY): Payer: BC Managed Care – PPO

## 2020-07-17 ENCOUNTER — Telehealth: Payer: Self-pay | Admitting: Plastic Surgery

## 2020-07-17 NOTE — Telephone Encounter (Signed)
Called patient to discuss, she reports that she was removing the bandages from her bilateral breasts and the gauze overlying bilateral NAC's are stuck in place due to dried blood.  She was concerned that removing the gauze would pull off her nipple/areola.  I discussed with her that the dried blood may have adhered to the gauze and that if she would like to shower she can shower and the water should soften up the gauze and allow them to come off.  She also reports that she noticed a little bit of redness under her left armpit near the incision.  She reports that she is not having any fevers, nausea, vomiting but did have a little bit of chills last night.  She is otherwise feeling well.  I discussed with her that I recommend she take a shower and allow the water to run over her breasts to help soften up the gauze and allow them to come off.  If she has any issues after her shower she can call us back and I will be happy to see her this afternoon.  She understood and agreed and said she would call back if she has any questions

## 2020-07-17 NOTE — Telephone Encounter (Signed)
Patient called to say she was changing her bandage as advised by the doctor and when her daughter took bandage off the side, there was blood dripping and appeared "liquid-y". The bandage on her nipple is stuck with dried blood so it looks like the nipple may come off when she takes the bandage off. Please call patient to advise best method to remove nipple bandage and if the bleeding is normal.

## 2020-07-23 ENCOUNTER — Ambulatory Visit (INDEPENDENT_AMBULATORY_CARE_PROVIDER_SITE_OTHER): Payer: BC Managed Care – PPO | Admitting: Surgical

## 2020-07-23 ENCOUNTER — Encounter: Payer: Self-pay | Admitting: Surgical

## 2020-07-23 ENCOUNTER — Other Ambulatory Visit: Payer: Self-pay

## 2020-07-23 VITALS — BP 151/106 | HR 82 | Temp 98.1°F

## 2020-07-23 DIAGNOSIS — M4004 Postural kyphosis, thoracic region: Secondary | ICD-10-CM

## 2020-07-23 DIAGNOSIS — M545 Low back pain, unspecified: Secondary | ICD-10-CM

## 2020-07-23 DIAGNOSIS — N62 Hypertrophy of breast: Secondary | ICD-10-CM

## 2020-07-23 DIAGNOSIS — M546 Pain in thoracic spine: Secondary | ICD-10-CM

## 2020-07-23 NOTE — Progress Notes (Signed)
39 yo female here for follow up after bilateral breast reduction on 07/14/20 with Dr. Claudia Desanctis.  Patient reports she is having some left underarm tenderness, reports difficulty lowering her left arm due to the pain.  She reports otherwise she is doing well she has noticed some drainage from her left breast near the lateral incision.  She has been covering this with gauze.  She reports her boyfriend has been helping with this.  She has not had any fevers, chills, nausea, vomiting.  Chaperone present on exam On exam bilateral breast are soft, NAC's are viable.  Steri-Strips in place over no fluid wave noted.  Within the left underarm there is some increased tenderness, but no erythema or cellulitic changes.  She has a small opening at the left lateral of her breast that is approximately 1 cm there is some slight drainage from this.  It does not have a foul odor.  Some soft tissue swelling noted on exam, difficult to determine if she has a fluid collection or not due to tenderness with palpation.  Discussed with patient the option of aspirating left breast to assess for fluid collection.  Left breast was aspirated using a sterile technique, no fluid able to be aspirated.  Recommend continuing wear sports bra, advised patient to purchase a new sports bras to what she is wearing 3 is not providing enough compression.  Discussed with patient that the left breast swelling was likely some soft tissue swelling.  No sign of infection or hematoma.  Patient has a follow-up scheduled for next week with Dr. Claudia Desanctis.  She is scheduled to return to work soon, but due to her limited range of motion of on the left side she may need 1 additional week out.  Recommended calling the letter or note.

## 2020-07-31 ENCOUNTER — Ambulatory Visit (INDEPENDENT_AMBULATORY_CARE_PROVIDER_SITE_OTHER): Payer: BC Managed Care – PPO | Admitting: Plastic Surgery

## 2020-07-31 ENCOUNTER — Other Ambulatory Visit: Payer: Self-pay

## 2020-07-31 ENCOUNTER — Encounter: Payer: Self-pay | Admitting: Plastic Surgery

## 2020-07-31 ENCOUNTER — Encounter: Payer: Self-pay | Admitting: Surgical

## 2020-07-31 VITALS — BP 140/83 | HR 73 | Temp 97.6°F

## 2020-07-31 DIAGNOSIS — N62 Hypertrophy of breast: Secondary | ICD-10-CM

## 2020-07-31 NOTE — Progress Notes (Signed)
Patient presents a little over 2 weeks out from bilateral breast reduction.  She is overall happy but feels like she has a lot of fullness in her left axilla.  On exam she is very full there and tender.  I aspirated around 200 cc of dark seroma fluid.  This relieved her symptoms and she felt much better afterwards.  The remainder of the incisions are totally intact and healing well.  Nipple areolar complexes are viable.  The remainder the Steri-Strips were removed.  Given the soreness in the axilla were planning to keep her out of work for another week and a half or so.  We will have her return around a week from now for reevaluation and further discussion about when she wants to return to work.  I have encouraged her to wear compressive garments in the meantime now that the seroma has been drained and she has a good sports bra with underarm coverage that I think should work well.  All of the rest of her questions were answered.  She knows that if this feels back up again then she could come back in sooner for another aspiration.

## 2020-08-07 ENCOUNTER — Ambulatory Visit (INDEPENDENT_AMBULATORY_CARE_PROVIDER_SITE_OTHER): Payer: BC Managed Care – PPO | Admitting: Surgical

## 2020-08-07 ENCOUNTER — Other Ambulatory Visit: Payer: Self-pay

## 2020-08-07 ENCOUNTER — Encounter: Payer: Self-pay | Admitting: Surgical

## 2020-08-07 VITALS — BP 139/95 | HR 78 | Temp 98.1°F

## 2020-08-07 DIAGNOSIS — N62 Hypertrophy of breast: Secondary | ICD-10-CM

## 2020-08-07 DIAGNOSIS — Z9889 Other specified postprocedural states: Secondary | ICD-10-CM

## 2020-08-07 NOTE — Progress Notes (Signed)
Patient is a 39 year old female here for follow-up after bilateral breast reduction with Dr. Claudia Desanctis on 07/14/2020.  She was last seen 1 week ago today and had 200 cc of dark seroma fluid drained from her left breast.  She reports that she has noticed some continued swelling of her left breast, but feels as if it is improved.  She also reports that she has noticed some tenderness in her right axillary region extending into her proximal upper arm and into her right breast.  She reports she has noticed what she thinks is a vein that is tender and hard in her right arm.  She otherwise feels well.  She reports that she discussed with her job that her restrictions for returning to work would be lifting no greater than 15 pounds for 6 weeks postop.  She reports that her job is stated that she would be required to lift about 25 pounds and therefore would need to be out of work until the restriction was lifted.   Chaperone present on exam. On exam bilateral breasts are soft overall, she does have some firmness along the left breast near the vertical limb incision.  There is no cellulitic changes or overlying skin changes.  The area is minimally tender to palpation.  She does have some swelling along the left lateral axillary region.  She also has some slight swelling of her right breast.  Bilateral NAC's are viable with good color.  Dr. Claudia Desanctis was present during part of today's evaluation and was able to drain 60 cc of dark seroma fluid from left breast.  No fluid was drained on the right breast.  Patient tolerated this fine without any issues.  Recommend following up in 1 week for reevaluation.  Call with any questions or concerns.  Remain out of work for 6 weeks as her job cannot allow her to return to work unless she is able to lift greater than 25 pounds.  Will have office staff fax clinical notes to patient's FMLA insurance.  She should remain out of work with lifting restrictions until 08/26/2020.  After this time  lifting restrictions should be lifted pending any changes in her status or any complications.  Patient is in agreement with this plan.  Recommend calling with questions or concerns prior to next week's appointment.

## 2020-08-13 ENCOUNTER — Ambulatory Visit (INDEPENDENT_AMBULATORY_CARE_PROVIDER_SITE_OTHER): Payer: BC Managed Care – PPO | Admitting: Surgical

## 2020-08-13 ENCOUNTER — Encounter: Payer: Self-pay | Admitting: Surgical

## 2020-08-13 ENCOUNTER — Encounter: Payer: BC Managed Care – PPO | Admitting: Surgical

## 2020-08-13 ENCOUNTER — Other Ambulatory Visit: Payer: Self-pay

## 2020-08-13 VITALS — BP 137/93 | HR 84 | Temp 99.0°F

## 2020-08-13 DIAGNOSIS — N62 Hypertrophy of breast: Secondary | ICD-10-CM

## 2020-08-13 DIAGNOSIS — M545 Low back pain, unspecified: Secondary | ICD-10-CM

## 2020-08-13 DIAGNOSIS — Z9889 Other specified postprocedural states: Secondary | ICD-10-CM

## 2020-08-13 DIAGNOSIS — M546 Pain in thoracic spine: Secondary | ICD-10-CM

## 2020-08-13 NOTE — Progress Notes (Signed)
Patient is a 39 year old female here for follow-up after bilateral breast reduction on 07/14/2020.  She is approximately 4 weeks postop.  Patient reports that she is overall doing well, continues to feel as if she has a little bit increased swelling to her left breast.  She reports that she continues to have a pulling sensation in bilateral armpits when extending her arms.  No F/C/N/V.  Chaperone present on exam Bilateral breast incisions are healing nicely, bilateral axillary incisions are healing nicely.  Some swelling noted of left breast, possibly some fluid collection noted, it is improved since her last visit.  Bilateral NAC's are viable with good color.  No incisional dehiscence noted.  Bilateral breasts are overall soft, some slight firmness along the left breast in the vertical limb incision.  No cellulitic changes or erythema noted.  She has some tenderness in her right proximal bilateral upper arms, thickened and tender vein noted with palpation in both right and left axillary regions.  There is no overlying skin changes or erythema.  She has no swelling of distal upper extremities.  No erythema.  Normal color of bilateral upper extremity.  No ecchymosis noted.  Recommend continuing to wear compressive garments, no additional fluid was aspirated today. She can begin using scar cream on inframammary fold and vertical limb incisions, Vaseline x1 week to bilateral NAC incisions and then she can begin using scar cream.  Recommend ibuprofen for possible phlebitis, no sign of any thrombophlebitis.  Physical exam is reassuring.  No sign of infection.  Continue to wear compressive garments, will reevaluate for any fluid collection in 2 to 3 weeks.  Patient is returning to work in approximately 2 weeks.  She has been out of work due to postop lifting restrictions/lifting requirements at her job.  Recommend calling with any questions or concerns prior to next follow-up, can be seen sooner if  needed.

## 2020-08-28 ENCOUNTER — Other Ambulatory Visit: Payer: Self-pay

## 2020-08-28 ENCOUNTER — Ambulatory Visit (INDEPENDENT_AMBULATORY_CARE_PROVIDER_SITE_OTHER): Payer: BC Managed Care – PPO | Admitting: Plastic Surgery

## 2020-08-28 ENCOUNTER — Encounter: Payer: Self-pay | Admitting: Plastic Surgery

## 2020-08-28 VITALS — BP 146/97 | HR 70 | Temp 97.5°F

## 2020-08-28 DIAGNOSIS — N62 Hypertrophy of breast: Secondary | ICD-10-CM

## 2020-08-28 NOTE — Progress Notes (Signed)
Patient presents about 6 weeks postop from bilateral breast reduction.  She says overall she is doing well.  She still feels a little fuller on the lateral aspect of the left side and feels like there is some firmness in that area.  She asked me to see if anything could be aspirated so I attempted an aspiration with an 18-gauge needle in the area of concern but was unable to get anything out.  I suspect she did have a seroma there initially and there is some firmness to the tissue surrounding it that will soften with time.  I recommended continuing to wear compressive garments.  She is back to work now and doing well.  We will plan to see her again in about 6 weeks to reevaluate that area.  All her questions were answered.

## 2020-10-06 ENCOUNTER — Telehealth: Payer: Self-pay

## 2020-10-06 NOTE — Telephone Encounter (Signed)
Patient called in and stated that she was giving a sample of LO-LO its a birth control she says that she would like a prescription sent to [facility] on [address]. I told the patient I will send a message to the nurse and to please allow 24- 48 hours. The pt verbally understood.  Please advise

## 2020-10-07 NOTE — Telephone Encounter (Signed)
Dr. Marcelline Mates, I looked at her last note in July I did not see where you had given her samples of Lo-Lo. Please advise. Thanks [employer]

## 2020-10-07 NOTE — Telephone Encounter (Signed)
She can begin the Lo-Loestrin. She was initially trying to conceive but I guess she has changed her mind. She should start the pills on the Sunday after her next cycle. We can prescribe enough for the year.

## 2020-10-09 ENCOUNTER — Other Ambulatory Visit: Payer: Self-pay

## 2020-10-09 MED ORDER — NORETHIN ACE-ETH ESTRAD-FE 1-20 MG-MCG PO TABS: 1.0000 | ORAL_TABLET | Freq: Every day | ORAL | 11 refills | Status: DC

## 2020-10-10 NOTE — Telephone Encounter (Signed)
Sent pt a mychart message informing pt that her medication had been refilled and directions on how to use the medication.

## 2021-04-29 ENCOUNTER — Encounter: Payer: Self-pay | Admitting: Obstetrics and Gynecology

## 2021-04-29 ENCOUNTER — Other Ambulatory Visit: Payer: Self-pay

## 2021-04-29 ENCOUNTER — Ambulatory Visit (INDEPENDENT_AMBULATORY_CARE_PROVIDER_SITE_OTHER): Payer: BC Managed Care – PPO | Admitting: Obstetrics and Gynecology

## 2021-04-29 VITALS — BP 148/94 | HR 82 | Ht 64.5 in | Wt 173.0 lb

## 2021-04-29 DIAGNOSIS — E663 Overweight: Secondary | ICD-10-CM

## 2021-04-29 DIAGNOSIS — Z3169 Encounter for other general counseling and advice on procreation: Secondary | ICD-10-CM | POA: Diagnosis not present

## 2021-04-29 DIAGNOSIS — Z86018 Personal history of other benign neoplasm: Secondary | ICD-10-CM | POA: Diagnosis not present

## 2021-04-29 DIAGNOSIS — I10 Essential (primary) hypertension: Secondary | ICD-10-CM

## 2021-04-29 NOTE — Addendum Note (Signed)
Addended by: Augusto Gamble on: 04/29/2021 08:36 PM   Modules accepted: Orders

## 2021-04-29 NOTE — Progress Notes (Signed)
    GYNECOLOGY PROGRESS NOTE  Subjective:    Patient ID: Gabriela Thomas, female    DOB: 03/11/1981, 40 y.o.   MRN: [id number]  HPI  Patient is a 40 y.o. G35P1001 female who presents for discussion of infertility. She has a PMH of HTN (controlled on meds) and uterine fibroids. Has been trying for 5-6 months. Has 56 year old child from a previous relationship. Partner age 34, they have been together for ~ 10 years. He has never fathered a child, or had any previous genital surgeries or infections. Both are non-smokers. She reports that she is taking a PNV.   Cycles are occurring every 22-23 days. Lasts for ~ 4 days.  Usually mild dysmenorrhea. Discontinued birth control (Lo-Loestrin) ~ 6 months ago.  Does report that cycles did this previously even before birth control. With regards to her history of uterine fibroids, s/p myomectomy in 0932 (complicated by post-operative hemorrhage, requiring exploratory laparotomy). Denies any issues with pelvic infections in the past. Has been doing ovulation kits which do show ovulation is occurring, usually around Day 7 or 8.    The following portions of the patient's history were reviewed and updated as appropriate: allergies, current medications, past family history, past medical history, past social history, past surgical history and problem list.  Review of Systems Pertinent items noted in HPI and remainder of comprehensive ROS otherwise negative.   Objective:   Blood pressure (!) 148/94, pulse 82, height 5' 4.5" (1.638 m), weight 173 lb (78.5 kg), last menstrual period 04/21/2021. Body mass index is 29.24 kg/m.  General appearance: alert and no distress Remainder of exam deferred. However last physical exam from 06/2020 does not note any uterine enlargement.    Assessment:   1. Infertility counseling   2. History of uterine fibroid   3. Essential hypertension   4. Overweight (BMI 25.0-29.9)    Plan:   1.  Infertility counseling - Patient has  had unprotected intercourse for about a 6 months with no conception.  She has been with her current partner for approximately 10 years, and has tried intermittently during that time at attempts for conception.  She reported normal menstrual periods.   Is status post use of OCPs, however discontinued approximately 6 months ago.  Patient is utilizing LH/ovulation predictor kits, also advised to have timed intercourse at least every other day around the time of ovulation.  Will check and follow up on infertility workup labs, next step will be to do semen analysis and sonohysterogram (in light of patient's previous history of fibroids as this will help to assess uterine cavity as well as fallopian tubes).  Will continue to follow.   2.  History of uterine fibroid, status post myomectomy.  No evidence of breach of cavity noted on operative report.  Patient would be able to have a vaginal delivery if she successfully becomes pregnant. 3.  Hypertension, currently on Toprol-XL and Benicar HCT.  Would be able to continue Toprol-XL but would need to discontinue Benicar and be switched to another blood pressure medicine if pregnancy occurs.  Is also currently working on lifestyle changes such as improving diet and exercising.   4.  Overweight -patient notes she is currently working on lifestyle interventions.   A total of 25 minutes were spent face-to-face with the patient during this encounter and over half of that time involved counseling and coordination of care.  Rubie Maid, MD [facility]

## 2021-04-29 NOTE — Progress Notes (Signed)
Pt present for infertility issues. Pt stated she had a cycle in 4/2/202 and 04/21/2021.  Stopped Loestrin about 6 months ago.

## 2021-04-29 NOTE — Patient Instructions (Signed)
Female Infertility  Female infertility refers to a woman's inability to get pregnant (conceive) after a year of having sex regularly (or after 6 months in women over age 40) without using birth control. Infertility can also mean that a woman is not able to carry a pregnancy to full term. Both women and men can have fertility problems. What are the causes? This condition may be caused by:  Problems with reproductive organs. Infertility can result if a woman: ? Has an abnormally short cervix or a cervix that does not remain closed during a pregnancy. ? Has a blockage or scarring in the fallopian tubes. ? Has an abnormally shaped uterus. ? Has uterine fibroids. This is a benign mass of tissue or muscle (tumor) that can develop in the uterus. ? Is not ovulating in a regular way.  Certain medical conditions. These may include: ? Polycystic ovary syndrome (PCOS). This is a hormonal disorder that can cause small cysts to grow on the ovaries. This is the most common cause of infertility in women. ? Endometriosis. This is a condition in which the tissue that lines the uterus (endometrium) grows outside of its normal location. ? Cancer and cancer treatments, such as chemotherapy or radiation. ? Premature ovarian failure. This is when ovaries stop producing eggs and hormones before age 43. ? Sexually transmitted diseases, such as chlamydia or gonorrhea. ? Autoimmune disorders. These are disorders in which the body's defense system (immune system) attacks normal, healthy cells. Infertility can be linked to more than one cause. For some women, the cause of infertility is not known (unexplained infertility). What increases the risk?  Age. A woman's fertility declines with age, especially after her mid-62s.  Being underweight or overweight.  Drinking too much alcohol.  Using drugs such as anabolic steroids, cocaine, and marijuana.  Exercising excessively.  Being exposed to environmental toxins,  such as radiation, pesticides, and certain chemicals. What are the signs or symptoms? The main sign of infertility in women is the inability to get pregnant or carry a pregnancy to full term. How is this diagnosed? This condition may be diagnosed by:  Checking whether you are ovulating each month. The tests may include: ? Blood tests to check hormone levels. ? An ultrasound of the ovaries. ? Taking a small tissue that lines the uterus and checking it under a microscope (endometrial biopsy).  Doing additional tests. This is done if ovulation is normal. Tests may include: ? Hysterosalpingography. This X-ray test can show the shape of the uterus and whether the fallopian tubes are open. ? Laparoscopy. This test uses a lighted tube (laparoscope) to look for problems in the fallopian tubes and other organs. ? Transvaginal ultrasound. This imaging test is used to check for abnormalities in the uterus and ovaries. ? Hysteroscopy. This test uses a lighted tube to check for problems in the cervix and the uterus. To be diagnosed with infertility, both partners will have a physical exam. Both partners will also have an extensive medical and sexual history taken. Additional tests may be done. How is this treated? Treatment depends on the cause of infertility. Most cases of infertility in women are treated with medicine or surgery.  Women may take medicine to: ? Correct ovulation problems. ? Treat other health conditions.  Surgery may be done to: ? Repair damage to the ovaries, fallopian tubes, cervix, or uterus. ? Remove growths from the uterus. ? Remove scar tissue from the uterus, pelvis, or other organs. Assisted reproductive technology (ART) Assisted reproductive technology (  ART) refers to all treatments and procedures that combine eggs and sperm outside the body to try to help a couple conceive. ART is often combined with fertility drugs to stimulate ovulation. Sometimes ART is done using eggs  retrieved from another woman's body (donor eggs) or from previously frozen fertilized eggs (embryos). There are different types of ART. These include:  Intrauterine insemination (IUI). A long, thin tube is used to place sperm directly into a woman's uterus. This procedure: ? Is effective for infertility caused by sperm problems, including low sperm count and low motility. ? Can be used in combination with fertility drugs.  In vitro fertilization (IVF). This is done when a woman's fallopian tubes are blocked or when a man has low sperm count. In this procedure: ? Fertility drugs are used to stimulate the ovaries to produce multiple eggs. ? Once mature, these eggs are removed from the body and combined with the sperm to be fertilized. ? The fertilized eggs are then placed into the woman's uterus. Follow these instructions at home:  Take over-the-counter and prescription medicines only as told by your health care provider.  Do not use any products that contain nicotine or tobacco, such as cigarettes and e-cigarettes. If you need help quitting, ask your health care provider.  If you drink alcohol, limit how much you have to 1 drink a day.  Make dietary changes to lose weight or maintain a healthy weight. Work with your health care provider and a dietitian to set a weight-loss goal that is healthy and reasonable for you.  Seek support from a counselor or support group to talk about your concerns related to infertility. Couples counseling may be helpful for you and your partner.  Practice stress reduction techniques that work well for you, such as regular physical activity, meditation, or deep breathing.  Keep all follow-up visits as told by your health care provider. This is important. Contact a health care provider if you:  Feel that stress is interfering with your life and relationships.  Have side effects from treatments for infertility. Summary  Female infertility refers to a woman's  inability to get pregnant (conceive) after a year of having sex regularly (or after 6 months in women over age 47) without using birth control.  To be diagnosed with infertility, both partners will have a physical exam. Both partners will also have an extensive medical and sexual history taken.  Seek support from a counselor or support group to talk about your concerns related to infertility. Couples counseling may be helpful for you and your partner. This information is not intended to replace advice given to you by your health care provider. Make sure you discuss any questions you have with your health care provider. Document Revised: 09/25/2020 Document Reviewed: 08/15/2020 Elsevier Patient Education  [place].

## 2021-05-04 LAB — PROLACTIN: Prolactin: 14.7 ng/mL (ref 4.8–23.3)

## 2021-05-04 LAB — TSH: TSH: 1.79 u[IU]/mL (ref 0.450–4.500)

## 2021-05-04 LAB — FSH/LH
FSH: 3.2 m[IU]/mL
LH: 5.5 m[IU]/mL

## 2021-05-04 LAB — ESTRADIOL: Estradiol: 202 pg/mL

## 2021-05-04 LAB — ANTI MULLERIAN HORMONE: ANTI-MULLERIAN HORMONE (AMH): 2.85 ng/mL

## 2021-05-04 LAB — PROGESTERONE: Progesterone: 0.1 ng/mL

## 2021-05-21 NOTE — Progress Notes (Signed)
PT is present today for confirmation of pregnancy. Pt LMP 04-21-2021. UPT done today results were positive.

## 2021-05-22 ENCOUNTER — Ambulatory Visit: Payer: BC Managed Care – PPO | Admitting: Obstetrics and Gynecology

## 2021-05-22 ENCOUNTER — Other Ambulatory Visit: Payer: Self-pay

## 2021-05-22 ENCOUNTER — Encounter: Payer: Self-pay | Admitting: Obstetrics and Gynecology

## 2021-05-22 VITALS — BP 149/90 | HR 84 | Ht 64.5 in | Wt 172.6 lb

## 2021-05-22 DIAGNOSIS — O09521 Supervision of elderly multigravida, first trimester: Secondary | ICD-10-CM | POA: Diagnosis not present

## 2021-05-22 DIAGNOSIS — I10 Essential (primary) hypertension: Secondary | ICD-10-CM

## 2021-05-22 DIAGNOSIS — Z3201 Encounter for pregnancy test, result positive: Secondary | ICD-10-CM | POA: Diagnosis not present

## 2021-05-22 DIAGNOSIS — Z9889 Other specified postprocedural states: Secondary | ICD-10-CM

## 2021-05-22 DIAGNOSIS — Z86018 Personal history of other benign neoplasm: Secondary | ICD-10-CM | POA: Diagnosis not present

## 2021-05-22 DIAGNOSIS — N926 Irregular menstruation, unspecified: Secondary | ICD-10-CM

## 2021-05-22 LAB — POCT URINE PREGNANCY: Preg Test, Ur: POSITIVE — AB

## 2021-05-22 NOTE — Patient Instructions (Addendum)
Obstetrics: Normal and Problem Pregnancies (7th ed., pp. 102-121). [place], [place]: Elsevier."> Textbook of Family Medicine (9th ed., pp. [phone]). [place], [place]: Elsevier [employer].">  First Trimester of Pregnancy  The first trimester of pregnancy starts on the first day of your last menstrual period until the end of week 12. This is months 1 through 3 of pregnancy. A week after a sperm fertilizes an egg, the egg will implant into the wall of the uterus and begin to develop into a baby. By the end of 12 weeks, all the baby's organs will be formed and the baby will be 2-3 inches in size. Body changes during your first trimester Your body goes through many changes during pregnancy. The changes vary and generally return to normal after your baby is born. Physical changes  You may gain or lose weight.  Your breasts may begin to grow larger and become tender. The tissue that surrounds your nipples (areola) may become darker.  Dark spots or blotches (chloasma or mask of pregnancy) may develop on your face.  You may have changes in your hair. These can include thickening or thinning of your hair or changes in texture. Health changes  You may feel nauseous, and you may vomit.  You may have heartburn.  You may develop headaches.  You may develop constipation.  Your gums may bleed and may be sensitive to brushing and flossing. Other changes  You may tire easily.  You may urinate more often.  Your menstrual periods will stop.  You may have a loss of appetite.  You may develop cravings for certain kinds of food.  You may have changes in your emotions from day to day.  You may have more vivid and strange dreams. Follow these instructions at home: Medicines  Follow your health care provider's instructions regarding medicine use. Specific medicines may be either safe or unsafe to take during pregnancy. Do not take any medicines unless told to by your health care provider.  Take a  prenatal vitamin that contains at least 600 micrograms (mcg) of folic acid. Eating and drinking  Eat a healthy diet that includes fresh fruits and vegetables, whole grains, good sources of protein such as meat, eggs, or tofu, and low-fat dairy products.  Avoid raw meat and unpasteurized juice, milk, and cheese. These carry germs that can harm you and your baby.  If you feel nauseous or you vomit: ? Eat 4 or 5 small meals a day instead of 3 large meals. ? Try eating a few soda crackers. ? Drink liquids between meals instead of during meals.  You may need to take these actions to prevent or treat constipation: ? Drink enough fluid to keep your urine pale yellow. ? Eat foods that are high in fiber, such as beans, whole grains, and fresh fruits and vegetables. ? Limit foods that are high in fat and processed sugars, such as fried or sweet foods. Activity  Exercise only as directed by your health care provider. Most people can continue their usual exercise routine during pregnancy. Try to exercise for 30 minutes at least 5 days a week.  Stop exercising if you develop pain or cramping in the lower abdomen or lower back.  Avoid exercising if it is very hot or humid or if you are at high altitude.  Avoid heavy lifting.  If you choose to, you may have sex unless your health care provider tells you not to. Relieving pain and discomfort  Wear a good support bra to relieve breast  tenderness.  Rest with your legs elevated if you have leg cramps or low back pain.  If you develop bulging veins (varicose veins) in your legs: ? Wear support hose as told by your health care provider. ? Elevate your feet for 15 minutes, 3-4 times a day. ? Limit salt in your diet. Safety  Wear your seat belt at all times when driving or riding in a car.  Talk with your health care provider if someone is verbally or physically abusive to you.  Talk with your health care provider if you are feeling sad or have  thoughts of hurting yourself. Lifestyle  Do not use hot tubs, steam rooms, or saunas.  Do not douche. Do not use tampons or scented sanitary pads.  Do not use herbal remedies, alcohol, illegal drugs, or medicines that are not approved by your health care provider. Chemicals in these products can harm your baby.  Do not use any products that contain nicotine or tobacco, such as cigarettes, e-cigarettes, and chewing tobacco. If you need help quitting, ask your health care provider.  Avoid cat litter boxes and soil used by cats. These carry germs that can cause birth defects in the baby and possibly loss of the unborn baby (fetus) by miscarriage or stillbirth. General instructions  During routine prenatal visits in the first trimester, your health care provider will do a physical exam, perform necessary tests, and ask you how things are going. Keep all follow-up visits. This is important.  Ask for help if you have counseling or nutritional needs during pregnancy. Your health care provider can offer advice or refer you to specialists for help with various needs.  Schedule a dentist appointment. At home, brush your teeth with a soft toothbrush. Floss gently.  Write down your questions. Take them to your prenatal visits. Where to find more information  American Pregnancy Association: americanpregnancy.[place] and Gynecologists: PoolDevices.com.pt  Office on [employer]: KeywordPortfolios.com.br Contact a health care provider if you have:  Dizziness.  A fever.  Mild pelvic cramps, pelvic pressure, or nagging pain in the abdominal area.  Nausea, vomiting, or diarrhea that lasts for 24 hours or longer.  A bad-smelling vaginal discharge.  Pain when you urinate.  Known exposure to a contagious illness, such as chickenpox, measles, Zika virus, HIV, or hepatitis. Get help right away if you have:  Spotting or bleeding from your  vagina.  Severe abdominal cramping or pain.  Shortness of breath or chest pain.  Any kind of trauma, such as from a fall or a car crash.  New or increased pain, swelling, or redness in an arm or leg. Summary  The first trimester of pregnancy starts on the first day of your last menstrual period until the end of week 12 (months 1 through 3).  Eating 4 or 5 small meals a day rather than 3 large meals may help to relieve nausea and vomiting.  Do not use any products that contain nicotine or tobacco, such as cigarettes, e-cigarettes, and chewing tobacco. If you need help quitting, ask your health care provider.  Keep all follow-up visits. This is important. This information is not intended to replace advice given to you by your health care provider. Make sure you discuss any questions you have with your health care provider. Document Revised: 05/21/2020 Document Reviewed: 03/27/2020 Elsevier Patient Education  2021 [employer].   How a Baby Grows During Pregnancy Pregnancy begins when a female's sperm enters a female's egg. This  is called fertilization. Fertilization usually happens in one of the fallopian tubes that connect the ovaries to the uterus. The fertilized egg moves down the fallopian tube to the uterus. Once it reaches the uterus, it implants into the lining of the uterus and begins to grow. For the first 8 weeks, the fertilized egg is called an embryo. After 8 weeks, it is called a fetus. As the fetus continues to grow, it receives oxygen and nutrients through the placenta, which is an organ that grows to support the developing baby. The placenta is the life support system for the baby. It provides oxygen and nutrition and removes waste. How long does a typical pregnancy last? A pregnancy usually lasts 280 days, or about 40 weeks. Pregnancy is divided into three periods of growth, also called trimesters:  First trimester: 0-12 weeks.  Second trimester: 13-27 weeks.  Third  trimester: 28-40 weeks. The day when your baby is ready to be born (full term) is your estimated date of delivery. However, most babies are not born on their estimated date of delivery. How does my baby develop month by month? First month  The fertilized egg attaches to the inside of the uterus.  Some cells will form the placenta. Others will form the fetus.  The arms, legs, brain, spinal cord, lungs, and heart begin to develop.  At the end of the first month, the heart begins to beat. Second month  The bones, inner ear, eyelids, hands, and feet form.  The genitals develop.  By the end of 8 weeks, all major organs are developing. Third month  All of the internal organs are forming.  Teeth develop below the gums.  Bones and muscles begin to grow. The spine can flex.  The skin is transparent.  Fingernails and toenails begin to form.  Arms and legs continue to grow longer, and hands and feet develop.  The fetus is about 3 inches (7.6 cm) long. Fourth month  The placenta is completely formed.  The external sex organs, neck, outer ear, eyebrows, eyelids, and fingernails are formed.  The fetus can hear, swallow, and move its arms and legs.  The kidneys begin to produce urine.  The skin is covered with a white, waxy coating (vernix) and very fine hair (lanugo). Fifth month  The fetus moves around more and can be felt for the first time (quickening).  The fetus starts to sleep and wake up and may begin to suck a finger.  The nails grow to the end of the fingers.  The organ in the digestive system that makes bile (gallbladder) functions and helps to digest nutrients.  If the fetus is a female, eggs are present in the ovaries. If the fetus is a female, testicles start to move down into the scrotum. Sixth month  The lungs are formed.  The eyes open. The brain continues to develop.  Your baby has fingerprints and toe prints. Your baby's hair grows thicker.  At the  end of the second trimester, the fetus is about 9 inches (22.9 cm) long. Seventh month  The fetus kicks and stretches.  The eyes are developed enough to sense changes in light.  The hands can make a grasping motion.  The fetus responds to sound. Eighth month  Most organs and body systems are fully developed and functioning.  Bones harden, and taste buds develop. The fetus may hiccup.  Certain areas of the brain are still developing. The skull remains soft. Ninth month  The fetus gains  about  lb (0.23 kg) each week.  The lungs are fully developed.  Patterns of sleep develop.  The fetus's head typically moves into a head-down position (vertex) in the uterus to prepare for birth.  The fetus weighs 6-9 lb (2.72-4.08 kg) and is 19-20 inches (48.26-50.8 cm) long.   How do I know if my baby is developing well? Always talk with your health care provider about any concerns that you may have about your pregnancy and your baby. At each prenatal visit, your health care provider will do several different tests to check on your health and keep track of your baby's development. These include:  Fundal height and position. To do this, your health care provider will: ? Measure your growing belly from your pubic bone to the top of the uterus using a tape measure. ? Feel your belly to determine your baby's position.  Heartbeat. An ultrasound in the first trimester can confirm pregnancy and show a heartbeat, depending on how far along you are. Your health care provider will check your baby's heart rate at every prenatal visit. You will also have a second trimester ultrasound to check your baby's development. Follow these instructions at home:  Take prenatal vitamins as told by your health care provider. These include vitamins such as folic acid, iron, calcium, and vitamin D. They are important for healthy development.  Take over-the-counter and prescription medicines only as told by your health  care provider.  Keep all follow-up visits. This is important. Follow-up visits include prenatal care and screening tests. Summary  A pregnancy usually lasts 280 days, or about 40 weeks. Pregnancy is divided into three periods of growth, also called trimesters.  Your health care provider will monitor your baby's growth and development throughout your pregnancy.  Follow your health care provider's recommendations about taking prenatal vitamins and medicines during your pregnancy.  Talk with your health care provider if you have any concerns about your pregnancy or your developing baby. This information is not intended to replace advice given to you by your health care provider. Make sure you discuss any questions you have with your health care provider. Document Revised: 05/21/2020 Document Reviewed: 03/27/2020 Elsevier Patient Education  [place]. WHAT OB PATIENTS CAN EXPECT   Confirmation of pregnancy and ultrasound ordered if medically indicated-[redacted] weeks gestation  New OB (NOB) intake with nurse and New OB (NOB) labs- [redacted] weeks gestation  New OB (NOB) physical examination with provider- 11/[redacted] weeks gestation  Flu vaccine-[redacted] weeks gestation  Anatomy scan-[redacted] weeks gestation  Glucose tolerance test, blood work to test for anemia, T-dap vaccine-[redacted] weeks gestation  Vaginal swabs/cultures-STD/Group B strep-[redacted] weeks gestation  Appointments every 4 weeks until 28 weeks  Every 2 weeks from 28 weeks until 36 weeks  Weekly visits from 36 weeks until delivery  Common Medications Safe in Pregnancy  Acne:      Constipation:  Benzoyl Peroxide     Colace  Clindamycin      Dulcolax Suppository  Topica Erythromycin     Fibercon  Salicylic Acid      Metamucil         Miralax AVOID:        Senakot   Accutane    Cough:  Retin-A       Cough Drops  Tetracycline      Phenergan w/ Codeine if Rx  Minocycline      Robitussin (Plain &  DM)  Antibiotics:     Crabs/Lice:  Ceclor  RID  Cephalosporins    AVOID:  E-Mycins      Kwell  Keflex  Macrobid/Macrodantin   Diarrhea:  Penicillin      Kao-Pectate  Zithromax      Imodium AD         PUSH FLUIDS AVOID:       Cipro     Fever:  Tetracycline      Tylenol (Regular or Extra  Minocycline       Strength)  Levaquin      Extra Strength-Do not          Exceed 8 tabs/24 hrs Caffeine:        '200mg'$ /day (equiv. To 1 cup of coffee or  approx. 3 12 oz sodas)         Gas: Cold/Hayfever:       Gas-X  Benadryl      Mylicon  Claritin       Phazyme  **Claritin-D        Chlor-Trimeton    Headaches:  Dimetapp      ASA-Free Excedrin  Drixoral-Non-Drowsy     Cold Compress  Mucinex (Guaifenasin)     Tylenol (Regular or Extra  Sudafed/Sudafed-12 Hour     Strength)  **Sudafed PE Pseudoephedrine   Tylenol Cold & Sinus     Vicks Vapor Rub  Zyrtec  **AVOID if Problems With Blood Pressure         Heartburn: Avoid lying down for at least 1 hour after meals  Aciphex      Maalox     Rash:  Milk of Magnesia     Benadryl    Mylanta       1% Hydrocortisone Cream  Pepcid  Pepcid Complete   Sleep Aids:  Prevacid      Ambien   Prilosec       Benadryl  Rolaids       Chamomile Tea  Tums (Limit 4/day)     Unisom         Tylenol PM         Warm milk-add vanilla or  Hemorrhoids:       Sugar for taste  Anusol/Anusol H.C.  (RX: Analapram 2.5%)  Sugar Substitutes:  Hydrocortisone OTC     Ok in moderation  Preparation H      Tucks        Vaseline lotion applied to tissue with wiping    Herpes:     Throat:  Acyclovir      Oragel  Famvir  Valtrex     Vaccines:         Flu Shot Leg Cramps:       *Gardasil  Benadryl      Hepatitis A         Hepatitis B Nasal Spray:       Pneumovax  Saline Nasal Spray     Polio Booster         Tetanus Nausea:       Tuberculosis test or PPD  Vitamin B6 25 mg TID   AVOID:    Dramamine      *Gardasil  Emetrol       Live Poliovirus  Ginger  Root 250 mg QID    MMR (measles, mumps &  High Complex Carbs @ Bedtime    rebella)  Sea Bands-Accupressure    Varicella (Chickenpox)  Unisom 1/2 tab TID     *No known complications           If  received before Pain:         Known pregnancy;   Darvocet       Resume series after  Lortab        Delivery  Percocet    Yeast:   Tramadol      Femstat  Tylenol 3      Gyne-lotrimin  Ultram       Monistat  Vicodin           MISC:         All Sunscreens           Hair Coloring/highlights          Insect Repellant's          (Including DEET)         Mystic Tans

## 2021-05-22 NOTE — Progress Notes (Signed)
  Subjective:    Gabriela Thomas is a 40 y.o. G27P1001 female who presents for evaluation of amenorrhea. She believes she could be pregnant. Pregnancy is desired. She has had a history of secondary infertility. Was just starting evaluation. Sexual Activity: single partner, contraception: none.  Current symptoms also include: positive home pregnancy test ( x 4). Last period was normal. Patient's last menstrual period was 04/21/2021.    The following portions of the patient's history were reviewed and updated as appropriate: allergies, current medications, past family history, past medical history, past social history, past surgical history and problem list.  Review of Systems Pertinent items noted in HPI and remainder of comprehensive ROS otherwise negative.     Objective:    BP (!) 149/90   Pulse 84   Ht 5' 4.5" (1.638 m)   Wt 172 lb 9.6 oz (78.3 kg)   LMP 04/21/2021   BMI 29.17 kg/m  General: alert, no distress and no acute distress    Lab Review Urine HCG: positive    Assessment:   1. Missed menses   2. History of uterine fibroid   3. AMA (advanced maternal age) multigravida 79+, first trimester   4. Essential hypertension   5. Status post myomectomy      Plan:   1. Pregnancy Test: Positive: EDC: 01/26/2022 with EGA 4.3 weeks. . Briefly discussed pre-natal care options.. Encouraged well-balanced diet, plenty of rest when needed, pre-natal vitamins daily and walking for exercise. Discussed self-help for nausea, avoiding OTC medications until consulting provider or pharmacist, other than Tylenol as needed, minimal caffeine (1-2 cups daily) and avoiding alcohol. She will schedule her initial OB visit in the next month with her PCP or OB provider. Feel free to call with any questions.  2. History of uterine fibroids, s/p abdominal myomectomy 2 years ago. Is ok for vaginal birth if desired as no breach in endometrial cavity. Ultrasound for dating will also assess for presence of new  fibroids if present.  3. AMA, would recommend genetic screening in pregnancy.  4. Essential HTN, notes that her PCP has been changing her medications around since discovery of pregnancy. Still on Toprol-XL, however has been taken off the Benicar HCT and replaced with a new medication (but cannot recall the name of the new medication).  Advised to inform MD once she has access to new medication. Will also need to begin daily baby aspirin after [redacted] weeks gestation.    A total of 20 minutes were spent face-to-face with the patient during this encounter and over half of that time dealt with counseling and coordination of care.  Rubie Maid, MD [facility]

## 2021-06-12 ENCOUNTER — Ambulatory Visit (HOSPITAL_COMMUNITY): Payer: BC Managed Care – PPO

## 2021-06-26 ENCOUNTER — Telehealth: Payer: Self-pay | Admitting: Obstetrics and Gynecology

## 2021-06-26 ENCOUNTER — Other Ambulatory Visit: Payer: Self-pay

## 2021-06-26 ENCOUNTER — Emergency Department: Payer: BC Managed Care – PPO

## 2021-06-26 ENCOUNTER — Encounter: Payer: Self-pay | Admitting: Emergency Medicine

## 2021-06-26 ENCOUNTER — Emergency Department
Admission: EM | Admit: 2021-06-26 | Discharge: 2021-06-26 | Disposition: A | Discharge status: Home / Self Care | Payer: BC Managed Care – PPO | Attending: Emergency Medicine | Admitting: Emergency Medicine

## 2021-06-26 DIAGNOSIS — O99511 Diseases of the respiratory system complicating pregnancy, first trimester: Secondary | ICD-10-CM | POA: Insufficient documentation

## 2021-06-26 DIAGNOSIS — Z3A09 9 weeks gestation of pregnancy: Secondary | ICD-10-CM | POA: Insufficient documentation

## 2021-06-26 DIAGNOSIS — Z8742 Personal history of other diseases of the female genital tract: Secondary | ICD-10-CM | POA: Diagnosis not present

## 2021-06-26 DIAGNOSIS — Z2913 Encounter for prophylactic Rho(D) immune globulin: Secondary | ICD-10-CM | POA: Diagnosis not present

## 2021-06-26 DIAGNOSIS — O209 Hemorrhage in early pregnancy, unspecified: Secondary | ICD-10-CM

## 2021-06-26 DIAGNOSIS — O10011 Pre-existing essential hypertension complicating pregnancy, first trimester: Secondary | ICD-10-CM | POA: Diagnosis not present

## 2021-06-26 DIAGNOSIS — O200 Threatened abortion: Secondary | ICD-10-CM | POA: Insufficient documentation

## 2021-06-26 DIAGNOSIS — O208 Other hemorrhage in early pregnancy: Secondary | ICD-10-CM | POA: Diagnosis present

## 2021-06-26 DIAGNOSIS — J45909 Unspecified asthma, uncomplicated: Secondary | ICD-10-CM | POA: Insufficient documentation

## 2021-06-26 LAB — CBC WITH DIFFERENTIAL/PLATELET
Abs Immature Granulocytes: 0.01 10*3/uL (ref 0.00–0.07)
Basophils Absolute: 0 10*3/uL (ref 0.0–0.1)
Basophils Relative: 0 %
Eosinophils Absolute: 0.1 10*3/uL (ref 0.0–0.5)
Eosinophils Relative: 2 %
HCT: 35.1 % — ABNORMAL LOW (ref 36.0–46.0)
Hemoglobin: 12.2 g/dL (ref 12.0–15.0)
Immature Granulocytes: 0 %
Lymphocytes Relative: 47 %
Lymphs Abs: 2 10*3/uL (ref 0.7–4.0)
MCH: 33.2 pg (ref 26.0–34.0)
MCHC: 34.8 g/dL (ref 30.0–36.0)
MCV: 95.4 fL (ref 80.0–100.0)
Monocytes Absolute: 0.2 10*3/uL (ref 0.1–1.0)
Monocytes Relative: 5 %
Neutro Abs: 2 10*3/uL (ref 1.7–7.7)
Neutrophils Relative %: 46 %
Platelets: 162 10*3/uL (ref 150–400)
RBC: 3.68 MIL/uL — ABNORMAL LOW (ref 3.87–5.11)
RDW: 11.4 % — ABNORMAL LOW (ref 11.5–15.5)
WBC: 4.4 10*3/uL (ref 4.0–10.5)
nRBC: 0 % (ref 0.0–0.2)

## 2021-06-26 LAB — BASIC METABOLIC PANEL
Anion gap: 5 (ref 5–15)
BUN: 19 mg/dL (ref 6–20)
CO2: 25 mmol/L (ref 22–32)
Calcium: 8.8 mg/dL — ABNORMAL LOW (ref 8.9–10.3)
Chloride: 107 mmol/L (ref 98–111)
Creatinine, Ser: 1.16 mg/dL — ABNORMAL HIGH (ref 0.44–1.00)
GFR, Estimated: >60 mL/min (ref >60)
Glucose, Bld: 99 mg/dL (ref 70–99)
Potassium: 4 mmol/L (ref 3.5–5.1)
Sodium: 137 mmol/L (ref 135–145)

## 2021-06-26 LAB — ANTIBODY SCREEN: Antibody Screen: NEGATIVE

## 2021-06-26 LAB — ABO/RH: ABO/RH(D): B NEG

## 2021-06-26 LAB — HCG, QUANTITATIVE, PREGNANCY: hCG, Beta Chain, Quant, S: 10054 m[IU]/mL — ABNORMAL HIGH (ref <5)

## 2021-06-26 MED ORDER — RHO D IMMUNE GLOBULIN 1500 UNIT/2ML IJ SOSY
300.0000 ug | PREFILLED_SYRINGE | Freq: Once | INTRAMUSCULAR | Status: AC
  Administered 2021-06-26: 300 ug via INTRAMUSCULAR
  Filled 2021-06-26: qty 2

## 2021-06-26 NOTE — ED Triage Notes (Signed)
Pt comes into the ED via POV c/o mechnical fall on Saturday where she landed on her stomach.  Pt is currently [redacted] weeks pregnant and starting Sunday she started having vaginal bleeding.  Pt ambulatory and in NAD at this time.

## 2021-06-26 NOTE — ED Provider Notes (Signed)
[facility] Emergency Department Provider Note   ____________________________________________   Event Date/Time   First MD Initiated Contact with Patient 06/26/21 [phone]     (approximate)  I have reviewed the triage vital signs and the nursing notes.   HISTORY  Chief Complaint Vaginal Bleeding and Fall    HPI Gabriela Thomas is a 40 y.o. female with past medical history of hypertension and fibroids, G2 P1-0-0-1 at approximately 9 weeks of pregnancy, who presents to the ED complaining of vaginal bleeding.  Patient reports that she tripped and fell at her cousin's birthday party 6 days ago, striking her chest and abdomen.  She initially felt well with no abdominal pain or vaginal bleeding.  A couple of days later she developed light vaginal bleeding that she describes as spotting.  She has been wearing a pad and has only had to change it once daily.  Bleeding seems slightly heavier after she woke up this morning and she started having lower abdominal cramping.  She denies any associated dysuria, flank pain, nausea, vomiting, or changes in bowel movements.  She has not yet had an ultrasound this pregnancy.        Past Medical History:  Diagnosis Date   Anxiety    Asthma    AS A CHILD -NO INHALERS   Fibroid    GERD (gastroesophageal reflux disease)    OCC   Headache    Hypertension    Macromastia    PONV (postoperative nausea and vomiting)     Patient Active Problem List   Diagnosis Date Noted   History of uterine fibroid 04/29/2021   Overweight (BMI 25.0-29.9) 04/29/2021   S/P myomectomy 12/11/2018   Essential hypertension 11/19/2018   Intramural leiomyoma of uterus 11/15/2018    Past Surgical History:  Procedure Laterality Date   BREAST REDUCTION SURGERY Bilateral 07/14/2020   Procedure: MAMMARY REDUCTION  (BREAST);  Surgeon: Cindra Presume, MD;  Location: [place];  Service: Plastics;  Laterality: Bilateral;   LAPAROTOMY N/A  12/12/2018   Procedure: EXPLORATORY LAPAROTOMY, evacuation of hemoperitoneum, aspiration of left adnexal cyst;  Surgeon: Rubie Maid, MD;  Location: [facility];  Service: Gynecology;  Laterality: N/A;   MYOMECTOMY N/A 12/11/2018   Procedure: ABDOMINAL MYOMECTOMY;  Surgeon: Rubie Maid, MD;  Location: [facility];  Service: Gynecology;  Laterality: N/A;    Prior to Admission medications   Medication Sig Start Date End Date Taking? Authorizing Provider  Prenatal Vit-Fe Fumarate-FA (MULTIVITAMIN-PRENATAL) 27-0.8 MG TABS tablet Take 1 tablet by mouth daily at 12 noon.    [provider]    Allergies Patient has no known allergies.  Family History  Problem Relation Age of Onset   Hypertension Mother    Diabetes Mother    Congestive Heart Failure Mother    Hypertension Maternal Aunt    Hypertension Maternal Uncle    Hypertension Maternal Grandmother    Hypertension Maternal Grandfather     Social History Social History   Tobacco Use   Smoking status: Never   Smokeless tobacco: Never  Vaping Use   Vaping Use: Never used  Substance Use Topics   Alcohol use: Not Currently    Alcohol/week: 7.0 standard drinks    Types: 7 Glasses of wine per week   Drug use: No    Review of Systems  Constitutional: No fever/chills Eyes: No visual changes. ENT: No sore throat. Cardiovascular: Denies chest pain. Respiratory: Denies shortness of breath. Gastrointestinal: Positive for abdominal pain.  No  nausea, no vomiting.  No diarrhea.  No constipation. Genitourinary: Negative for dysuria.  Positive for vaginal bleeding. Musculoskeletal: Negative for back pain. Skin: Negative for rash. Neurological: Negative for headaches, focal weakness or numbness.  ____________________________________________   PHYSICAL EXAM:  VITAL SIGNS: ED Triage Vitals  Enc Vitals Group     BP 06/26/21 0753 (!) 144/98     Pulse Rate 06/26/21 0753 78     Resp 06/26/21 0753 18     Temp 06/26/21 0753  98.7 F (37.1 C)     Temp Source 06/26/21 0753 Oral     SpO2 06/26/21 0753 100 %     Weight 06/26/21 0748 172 lb 9.9 oz (78.3 kg)     Height 06/26/21 0748 5' 4.5" (1.638 m)     Head Circumference --      Peak Flow --      Pain Score 06/26/21 0748 5     Pain Loc --      Pain Edu? --      Excl. in [place]? --     Constitutional: Alert and oriented. Eyes: Conjunctivae are normal. Head: Atraumatic. Nose: No congestion/rhinnorhea. Mouth/Throat: Mucous membranes are moist. Neck: Normal ROM Cardiovascular: Normal rate, regular rhythm. Grossly normal heart sounds. Respiratory: Normal respiratory effort.  No retractions. Lungs CTAB. Gastrointestinal: Soft and nontender. No distention. Genitourinary: deferred Musculoskeletal: No lower extremity tenderness nor edema. Neurologic:  Normal speech and language. No gross focal neurologic deficits are appreciated. Skin:  Skin is warm, dry and intact. No rash noted. Psychiatric: Mood and affect are normal. Speech and behavior are normal.  ____________________________________________   LABS (all labs ordered are listed, but only abnormal results are displayed)  Labs Reviewed  HCG, QUANTITATIVE, PREGNANCY - Abnormal; Notable for the following components:      Result Value   hCG, Beta Chain, Quant, S 10,054 (*)    All other components within normal limits  CBC WITH DIFFERENTIAL/PLATELET - Abnormal; Notable for the following components:   RBC 3.68 (*)    HCT 35.1 (*)    RDW 11.4 (*)    All other components within normal limits  BASIC METABOLIC PANEL - Abnormal; Notable for the following components:   Creatinine, Ser 1.16 (*)    Calcium 8.8 (*)    All other components within normal limits  POC URINE PREG, ED  ABO/RH  ANTIBODY SCREEN  RHOGAM INJECTION    PROCEDURES  Procedure(s) performed (including Critical Care):  Procedures   ____________________________________________   INITIAL IMPRESSION / ASSESSMENT AND PLAN / ED COURSE       40 year old female with past medical history of hypertension and fibroids, G2 P1-0-0-1 at approximately 9 weeks of pregnancy, presents to the ED complaining of abdominal pain and light vaginal bleeding after fall onto her abdomen approximately 6 days ago.  She has no abdominal tenderness on exam and initial labs are reassuring, hemoglobin stable compared to previous.  We will check beta-hCG levels and further assess for ectopic pregnancy or miscarriage with ultrasound.  Patient is also Rh- and will require dose of RhoGAM.  Ultrasound shows intrauterine pregnancy at approximately 6 weeks based on size, no fetal heart rate visible.  This is concerning for potential failed pregnancy given patient would be 9 weeks by dates, but could also represent early pregnancy.  She has minimal bleeding or pain at this time and is appropriate for discharge home with close OB/GYN follow-up.  She was counseled to return to the ED for new worsening symptoms, patient agrees  with plan.      ____________________________________________   FINAL CLINICAL IMPRESSION(S) / ED DIAGNOSES  Final diagnoses:  Threatened miscarriage     ED Discharge Orders     None        Note:  This document was prepared using Dragon voice recognition software and may include unintentional dictation errors.    Blake Divine, MD 06/26/21 1224

## 2021-06-26 NOTE — Telephone Encounter (Signed)
  Pt seen in ed today d/t vaginal bleeding and cramps.   Beta- 10,000 + u/s,- 6 weeks (9 weeks per LP) no fetal pole no cardiac activity- f/u in 7-14 days- [facility] -    rhogam given- bneg  Spotting on Sunday. Currently brown d/c. Red clot x 1. Size of penny.  Mild cramps. No tylenol needed at this time.    U/s scheduled for 7/11 at 1 pm. [facility]. Pt aware to arrive at 1245 with full bladder. Nurse intake cx until viability is confirmed.   Pt aware if soaking a pad every hour or cramps not relieved with tylenol to contact office.   Pt to contact office on 7/12 for u/s results and next steps. Pt voiced understanding.

## 2021-06-26 NOTE — Telephone Encounter (Signed)
Patient called stating that she went to ER this morning for cramping and bleeding, they did blood work which was normal. They did an [place] that showed baby around 6 weeks with no heartbeat or not strong heartbeat. Pt states that she thought she was around [redacted] weeks pregnant. Pt is still having cramping and bleeding with clots. Pt said ER stated "threatened miscarriage" and was told to call provider to see what to do next. Please Advise.

## 2021-06-26 NOTE — ED Notes (Signed)
Patient transported to Ultrasound 

## 2021-06-27 LAB — RHOGAM INJECTION: Unit division: 0

## 2021-06-28 ENCOUNTER — Emergency Department
Admission: EM | Admit: 2021-06-28 | Discharge: 2021-06-28 | Disposition: A | Discharge status: Home / Self Care | Payer: BC Managed Care – PPO | Attending: Emergency Medicine | Admitting: Emergency Medicine

## 2021-06-28 ENCOUNTER — Encounter: Payer: Self-pay | Admitting: Emergency Medicine

## 2021-06-28 ENCOUNTER — Other Ambulatory Visit (INDEPENDENT_AMBULATORY_CARE_PROVIDER_SITE_OTHER): Payer: BC Managed Care – PPO | Admitting: Obstetrics and Gynecology

## 2021-06-28 ENCOUNTER — Other Ambulatory Visit: Payer: Self-pay

## 2021-06-28 DIAGNOSIS — O039 Complete or unspecified spontaneous abortion without complication: Secondary | ICD-10-CM

## 2021-06-28 DIAGNOSIS — J45909 Unspecified asthma, uncomplicated: Secondary | ICD-10-CM | POA: Insufficient documentation

## 2021-06-28 DIAGNOSIS — Z3A09 9 weeks gestation of pregnancy: Secondary | ICD-10-CM | POA: Insufficient documentation

## 2021-06-28 DIAGNOSIS — I10 Essential (primary) hypertension: Secondary | ICD-10-CM | POA: Diagnosis not present

## 2021-06-28 DIAGNOSIS — O200 Threatened abortion: Secondary | ICD-10-CM | POA: Diagnosis not present

## 2021-06-28 LAB — HCG, QUANTITATIVE, PREGNANCY: hCG, Beta Chain, Quant, S: 2583 m[IU]/mL — ABNORMAL HIGH (ref <5)

## 2021-06-28 LAB — CBC
HCT: 38.4 % (ref 36.0–46.0)
Hemoglobin: 13.4 g/dL (ref 12.0–15.0)
MCH: 33 pg (ref 26.0–34.0)
MCHC: 34.9 g/dL (ref 30.0–36.0)
MCV: 94.6 fL (ref 80.0–100.0)
Platelets: 181 10*3/uL (ref 150–400)
RBC: 4.06 MIL/uL (ref 3.87–5.11)
RDW: 11.5 % (ref 11.5–15.5)
WBC: 6.4 10*3/uL (ref 4.0–10.5)
nRBC: 0 % (ref 0.0–0.2)

## 2021-06-28 LAB — BASIC METABOLIC PANEL
Anion gap: 7 (ref 5–15)
BUN: 18 mg/dL (ref 6–20)
CO2: 24 mmol/L (ref 22–32)
Calcium: 9.2 mg/dL (ref 8.9–10.3)
Chloride: 105 mmol/L (ref 98–111)
Creatinine, Ser: 1.14 mg/dL — ABNORMAL HIGH (ref 0.44–1.00)
GFR, Estimated: >60 mL/min (ref >60)
Glucose, Bld: 96 mg/dL (ref 70–99)
Potassium: 3.8 mmol/L (ref 3.5–5.1)
Sodium: 136 mmol/L (ref 135–145)

## 2021-06-28 MED ORDER — ACETAMINOPHEN-CODEINE 300-30 MG PO TABS: 1.0000 | ORAL_TABLET | ORAL | 0 refills | Status: DC | PRN

## 2021-06-28 MED ORDER — TRAMADOL HCL 50 MG PO TABS
50.0000 mg | ORAL_TABLET | Freq: Once | ORAL | Status: AC
  Administered 2021-06-28: 50 mg via ORAL
  Filled 2021-06-28: qty 1

## 2021-06-28 MED ORDER — IBUPROFEN 800 MG PO TABS: 800.0000 mg | ORAL_TABLET | Freq: Three times a day (TID) | ORAL | 0 refills | Status: DC | PRN

## 2021-06-28 NOTE — ED Notes (Signed)
Pt is [redacted] weeks pregnant and had an ultrasound 2 days ago that showed her baby is smaller than expected ~6wk size. She was bleeding lightly at that time but now is bleeding profusely with large clots and severe abdominal cramping. When she is lying down, her bleeding slows but when she stands she is losing clots and a much heavier volume of blood. She has had a myomectomy previously and is scared because her blood loss is so much more extreme right now. She feels that her abdominal pain is 10/10 intermittently and feels like contractions. She was pregnant once before, 22 years ago, and has one living child aged 49. This is only her second pregnancy.

## 2021-06-28 NOTE — Discharge Instructions (Addendum)
You were seen today for vaginal bleeding in pregnancy.  Your blood counts indicate that you are having a miscarriage.  Your blood work does not show any signs of anemia.  We encourage rest, fluids.  Follow-up with your GYN as scheduled to repeat blood counts and ultrasound.  Return to the ER for weakness, shortness of breath or chest pain.

## 2021-06-28 NOTE — ED Provider Notes (Addendum)
[facility] Emergency Department Provider Note ____________________________________________  Time seen: 1800  I have reviewed the triage vital signs and the nursing notes.  HISTORY  Chief Complaint  Vaginal Bleeding   HPI Gabriela Thomas is a 40 y.o. female presents to the ER today with complaint of persistent vaginal bleeding, clots and pelvic cramping.  She reports this started 4 days ago.  She was seen 7/1 for the same.  Her LMP was 04/21/2021.  According to dates, she was [redacted] weeks gestation.  The ultrasound she had done on 7/1 showed approximately [redacted] weeks gestation with a hCG count of 10,054.  She has an appointment with OB/GYN 7/11.  She is G2, P1.  Past Medical History:  Diagnosis Date   Anxiety    Asthma    AS A CHILD -NO INHALERS   Fibroid    GERD (gastroesophageal reflux disease)    OCC   Headache    Hypertension    Macromastia    PONV (postoperative nausea and vomiting)     Patient Active Problem List   Diagnosis Date Noted   History of uterine fibroid 04/29/2021   Overweight (BMI 25.0-29.9) 04/29/2021   S/P myomectomy 12/11/2018   Essential hypertension 11/19/2018   Intramural leiomyoma of uterus 11/15/2018    Past Surgical History:  Procedure Laterality Date   BREAST REDUCTION SURGERY Bilateral 07/14/2020   Procedure: MAMMARY REDUCTION  (BREAST);  Surgeon: Cindra Presume, MD;  Location: [place];  Service: Plastics;  Laterality: Bilateral;   LAPAROTOMY N/A 12/12/2018   Procedure: EXPLORATORY LAPAROTOMY, evacuation of hemoperitoneum, aspiration of left adnexal cyst;  Surgeon: Rubie Maid, MD;  Location: [facility];  Service: Gynecology;  Laterality: N/A;   MYOMECTOMY N/A 12/11/2018   Procedure: ABDOMINAL MYOMECTOMY;  Surgeon: Rubie Maid, MD;  Location: [facility];  Service: Gynecology;  Laterality: N/A;    Prior to Admission medications   Medication Sig Start Date End Date Taking? Authorizing Provider  Prenatal  Vit-Fe Fumarate-FA (MULTIVITAMIN-PRENATAL) 27-0.8 MG TABS tablet Take 1 tablet by mouth daily at 12 noon.    [provider]    Allergies Patient has no known allergies.  Family History  Problem Relation Age of Onset   Hypertension Mother    Diabetes Mother    Congestive Heart Failure Mother    Hypertension Maternal Aunt    Hypertension Maternal Uncle    Hypertension Maternal Grandmother    Hypertension Maternal Grandfather     Social History Social History   Tobacco Use   Smoking status: Never   Smokeless tobacco: Never  Vaping Use   Vaping Use: Never used  Substance Use Topics   Alcohol use: Not Currently    Alcohol/week: 7.0 standard drinks    Types: 7 Glasses of wine per week   Drug use: No    Review of Systems  Constitutional: Negative for fever, chills or body aches. Cardiovascular: Negative for chest pain or chest tightness. Respiratory: Negative for shortness of breath. Gastrointestinal: Positive for pelvic cramping.  Negative for vomiting and diarrhea. Genitourinary: Positive for vaginal bleeding, clots.  Negative for urinary urgency, frequency or dysuria. Musculoskeletal: Negative for low back pain. Skin: Negative for rash. Neurological: Negative for focal weakness, tingling or numbness. ____________________________________________  PHYSICAL EXAM:  VITAL SIGNS: ED Triage Vitals  Enc Vitals Group     BP 06/28/21 1449 (!) 132/101     Pulse Rate 06/28/21 1443 93     Resp 06/28/21 1443 20     Temp  06/28/21 1443 98.2 F (36.8 C)     Temp Source 06/28/21 1443 Oral     SpO2 06/28/21 1443 99 %     Weight 06/28/21 1447 171 lb 15.3 oz (78 kg)     Height 06/28/21 1447 5\' 4"  (1.626 m)     Head Circumference --      Peak Flow --      Pain Score 06/28/21 1447 10     Pain Loc --      Pain Edu? --      Excl. in [place]? --     Constitutional: Alert and oriented. In no distress. Head: Normocephalic. Eyes: Normal extraocular movements. Cardiovascular:  Normal rate, regular rhythm. Respiratory: Normal respiratory effort. No wheezes/rales/rhonchi. Gastrointestinal: Soft and tender in bilateral lower quadrants. Neurologic:  Normal speech and language. No gross focal neurologic deficits are appreciated. Skin:  Skin is warm, dry and intact. No rash noted.  ____________________________________________    LABS Labs Reviewed  BASIC METABOLIC PANEL - Abnormal; Notable for the following components:      Result Value   Creatinine, Ser 1.14 (*)    All other components within normal limits  HCG, QUANTITATIVE, PREGNANCY - Abnormal; Notable for the following components:   hCG, Beta Chain, Quant, S 2,583 (*)    All other components within normal limits  CBC     _____________________________________________   INITIAL IMPRESSION / ASSESSMENT AND PLAN / ED COURSE  Vaginal Bleeding in Pregnancy, Threatened Miscarriage:  HCG counts trending down CBC and BMET unremarkable- no acute anemia Tramadol 50 mg PO x 1 She received Rhogam 7/1 No indication for repeat ultrasound at this time She will follow up with GYN as previously scheduled Return precautions discussed ____________________________________________  FINAL CLINICAL IMPRESSION(S) / ED DIAGNOSES  Final diagnoses:  Miscarriage      Jearld Fenton, NP 06/28/21 1818    Jearld Fenton, NP 06/28/21 Earley Favor    Duffy Bruce, MD 06/30/21 1207

## 2021-06-28 NOTE — Progress Notes (Signed)
Pt called after being seen in ED for "miscarriage" .  Requesting additional pain relief. Tyleonol #3 and Ibuprofen called to [facility]. Pt to FU with Dr. Vivien Rossetti.

## 2021-06-28 NOTE — ED Triage Notes (Signed)
Pt via POV from home. Pt was seen on 7/1 where she was dx with a threatened miscarriage. Pt states that the cramping and the bleeding gets more heavy, she has a [place] scheduled on 7/11. Pt is A&Ox4 and NAD.

## 2021-07-01 ENCOUNTER — Ambulatory Visit (INDEPENDENT_AMBULATORY_CARE_PROVIDER_SITE_OTHER): Payer: BC Managed Care – PPO | Admitting: Obstetrics and Gynecology

## 2021-07-01 ENCOUNTER — Ambulatory Visit: Payer: BC Managed Care – PPO

## 2021-07-01 ENCOUNTER — Other Ambulatory Visit: Payer: Self-pay

## 2021-07-01 ENCOUNTER — Encounter: Payer: Self-pay | Admitting: Obstetrics and Gynecology

## 2021-07-01 VITALS — BP 139/94 | HR 109 | Ht 64.0 in | Wt 171.4 lb

## 2021-07-01 DIAGNOSIS — I10 Essential (primary) hypertension: Secondary | ICD-10-CM

## 2021-07-01 DIAGNOSIS — O039 Complete or unspecified spontaneous abortion without complication: Secondary | ICD-10-CM

## 2021-07-01 DIAGNOSIS — O09521 Supervision of elderly multigravida, first trimester: Secondary | ICD-10-CM | POA: Diagnosis not present

## 2021-07-01 MED ORDER — NIFEDIPINE ER OSMOTIC RELEASE 90 MG PO TB24: 90.0000 mg | ORAL_TABLET | Freq: Every day | ORAL | 3 refills | Status: DC

## 2021-07-01 NOTE — Progress Notes (Signed)
GYNECOLOGY PROGRESS NOTE  Subjective:    Patient ID: Gabriela Thomas, female    DOB: 12-17-1981, 40 y.o.   MRN: [id number]  HPI  Patient is a 40 y.o. G24P1001 female who presents for follow-up after Emergency visit x 2 for miscarriage.  Patient was initially seen on July 1, with vaginal bleeding digressed to spotting after experiencing a ground-level fall.  At that time her ultrasound noted that she was approximately [redacted] weeks gestation (not 9 weeks by her LMP) and no fetal heart tones were detected.  Diagnosed with early IUP versus miscarriage.  She then reported back 2 days later due to heavier menstrual bleeding, and a drop in her beta-hCG levels consistent with miscarriage.  Today patient complains of continued moderate to severe cramping.  She does note that the bleeding is starting to get lighter with less passing of clots.  She called the after-hours nurse line yesterday and was prescribed ibuprofen and Tylenol #3 for her pain.  Notes some relief however cramping is still fairly significant when it occurs.  Patient also has questions regarding her blood pressure medication and whether she needs to stay on it and increase it.  Currently on Procardia 60 mg daily.  Notes that her blood pressures are decent however not as well-controlled as they were prior to pregnancy when she was on several other medications.  The following portions of the patient's history were reviewed and updated as appropriate: allergies, current medications, past family history, past medical history, past social history, past surgical history, and problem list.  Review of Systems Pertinent items noted in HPI and remainder of comprehensive ROS otherwise negative.   Objective:   Blood pressure (!) 139/94, pulse (!) 109, height 5\' 4"  (1.626 m), weight 171 lb 7 oz (77.8 kg), last menstrual period 04/21/2021. General appearance: alert and no distress Remainder of exam deferred.     Labs:  Lab Results  Component  Value Date   WBC 6.4 06/28/2021   HGB 13.4 06/28/2021   HCT 38.4 06/28/2021   MCV 94.6 06/28/2021   PLT 181 06/28/2021    Lab Results  Component Value Date   ABORH  06/26/2021    B NEG Performed at [facility], [address], [place], [place] [address]       Results for Gabriela Thomas (MRN [id number]) as of 07/01/2021 16:33  Ref. Range 06/26/2021 08:04 06/28/2021 14:46  HCG, Beta Chain, Quant, S Latest Ref Range: <5 mIU/mL 10,054 (H) 2,583 (H)     Imaging:  US OB LESS THAN 14 WEEKS WITH OB TRANSVAGINAL CLINICAL DATA:  Nine weeks pregnant, fall on Saturday, vaginal bleeding on Sunday  EXAM: OBSTETRIC <14 WK ULTRASOUND  TECHNIQUE: Transabdominal ultrasound was performed for evaluation of the gestation as well as the maternal uterus and adnexal regions.  COMPARISON:  None.  FINDINGS: Intrauterine gestational sac: Single  Yolk sac:  Visualized.  Embryo:  Visualized.  Cardiac Activity: Not Visualized.  Heart Rate: Not applicable.  CRL:   3.8 mm   6 w 0 d                  [place] EDC: 02/19/2022  Subchorionic hemorrhage:  Small.  Maternal uterus/adnexae: Corpus luteum of the right ovary. Multiple uterine fibroids.  IMPRESSION: 1. Single intrauterine gestation appropriately positioned in the uterine fundus at sonographic gestational age of [redacted] weeks, 0 days. Fetal pole is identified without no fetal cardiac activity; early pregnancy of uncertain viability. Recommend follow-up ultrasound in  7-14 days to assess for continued development and viability.  2.  Small subchorionic hemorrhage.  3.  Uterine fibroids.  Electronically Signed   By: Eddie Candle M.D.   On: 06/26/2021 10:35    Assessment:   Miscarriage History of fibroids Advanced maternal age Hypertension  Plan:   Discussion had regarding miscarriage.  Advised that the process can continue for up to approximately 7 days however bleeding and cramping should become lighter once miscarriage  has completed.  In the meantime patient can take 2 tablets of her Tylenol #3 and alternate with her ibuprofen.  Advised that if pain is still fairly significant, can prescribe a different medication.  Discussed future fertility plans with patient and her husband, both note that they desire to attempt to conceive again.  Advised to wait at least 1 normal cycle before attempting to conceive.  To continue utilizing prenatal vitamins.  Would also recommend additional folic acid 1 mg, and to begin a daily baby aspirin. History of fibroids -patient's status post myomectomy approximately 2 years ago.  Fibroids noted on most recent ultrasound however no delineation regarding size and location was noted.  However most recent pregnancy appeared to be appropriately implanted at the fundal region. Advanced maternal age -discussed that this can have some direct bearing on miscarriages, however recent hormone levels and AMH were checked just prior to conception of this pregnancy and were normal.  Would also recommend genetic screening once patient reaches age of viability in her next gestation.  If recurrent miscarriages occur can consider genetic testing prior to conceiving. Hypertension -patient currently on Procardia 60 mg daily.  Notes that she would like her blood pressure to be a little bit more well controlled.  Advised that she can increase dosing up to 90 mg. Patient to follow-up in 1 week for repeat hormone levels and reassessment of her symptoms.  If she is still experiencing significant pain or heavy bleeding, would recommend ultrasound at that time to assess for retained products.  Discussed possibility of requiring either medical or surgical intervention if retained products are noted.   A total of 15 minutes were spent face-to-face with the patient during this encounter and over half of that time dealt with counseling and coordination of care.   Rubie Maid, MD [facility]

## 2021-07-01 NOTE — Progress Notes (Signed)
Pt present to discuss miscarriage.

## 2021-07-06 ENCOUNTER — Ambulatory Visit: Admission: RE | Admit: 2021-07-06 | Payer: BC Managed Care – PPO | Source: Ambulatory Visit

## 2021-07-09 ENCOUNTER — Ambulatory Visit: Payer: BC Managed Care – PPO | Admitting: Obstetrics and Gynecology

## 2021-07-09 ENCOUNTER — Other Ambulatory Visit: Payer: Self-pay

## 2021-07-09 ENCOUNTER — Encounter: Payer: Self-pay | Admitting: Obstetrics and Gynecology

## 2021-07-09 VITALS — BP 134/88 | HR 103 | Ht 64.0 in | Wt 173.1 lb

## 2021-07-09 DIAGNOSIS — I10 Essential (primary) hypertension: Secondary | ICD-10-CM | POA: Diagnosis not present

## 2021-07-09 DIAGNOSIS — O09521 Supervision of elderly multigravida, first trimester: Secondary | ICD-10-CM | POA: Diagnosis not present

## 2021-07-09 DIAGNOSIS — O039 Complete or unspecified spontaneous abortion without complication: Secondary | ICD-10-CM

## 2021-07-09 NOTE — Patient Instructions (Signed)
Managing Pregnancy Loss Pregnancy loss can happen any time during a pregnancy. Often the cause is not known. It is rarely because of anything you did. Pregnancy loss in early pregnancy (during the first trimester) is called a miscarriage. This type of pregnancy loss is the most common. Pregnancy loss that happens after 20 weeks of pregnancy is called fetal demise if the baby's heart stops beating before birth. Fetal demise is much less common. Some women experience spontaneous labor shortly after fetal demise resulting in a stillborn birth (stillbirth). Any pregnancy loss can be devastating. You will need to recover both physically and emotionally. Most women are able to get pregnant again after a pregnancyloss and deliver a healthy baby. How to manage emotional recovery  Pregnancy loss is very hard emotionally. You may feel many different emotions while you grieve. You may feel sad and angry. You may also feel guilty. It is normal to have periods of crying. Emotional recovery can take longer thanphysical recovery. It is different for everyone. Taking these steps can help you in managing this loss: Remember that it is unlikely you did anything to cause the pregnancy loss. Share your thoughts and feelings with friends, family, and your partner. Remember that your partner is also recovering emotionally. Make sure you have a good support system. Do not spend too much time alone. Meet with a pregnancy loss counselor or join a pregnancy loss support group. Get enough sleep and eat a healthy diet. Return to regular exercise when you have recovered physically. Do not use drugs or alcohol to manage your emotions. Consider seeing a mental health professional to help you recover emotionally. Ask a friend or loved one to help you decide what to do with any clothing and nursery items you received for your baby. In the case of a stillbirth, many women benefit from taking additional steps in the grieving process.  You may want to: Hold your baby after the birth. Name your baby. Request a birth certificate. Create a keepsake such as handprints or footprints. Dress your baby and have a picture taken. Make funeral arrangements. Ask for a baptism or blessing. Hospitals have staff members who can help you with all these arrangements. How to recognize emotional stress It is normal to have emotional stress after a pregnancy loss. But emotional stress that lasts a long time or becomes severe requires treatment. Watch out for these signs of severe emotional stress: Sadness, anger, or guilt that is not going away and is interfering with your normal activities. Relationship problems that have occurred or gotten worse since the pregnancy loss. Signs of depression that last longer than 2 weeks. These may include: Sadness. Anxiety. Hopelessness. Loss of interest in activities you enjoy. Inability to concentrate. Trouble sleeping or sleeping too much. Loss of appetite or overeating. Thoughts of death or of hurting yourself. Follow these instructions at home: Take over-the-counter and prescription medicines only as told by your health care provider. Rest at home until your energy level returns. Return to your normal activities as told by your health care provider. Ask your health care provider what activities are safe for you. When you are ready, meet with your health care provider to discuss steps to take for a future pregnancy. Keep all follow-up visits as told by your health care provider. This is important. Where to find support To help you and your partner with the process of grieving, talk with your health care provider or seek counseling. Consider meeting with others who have experienced pregnancy  loss. Ask your health care provider about support groups and resources. Where to find more information [place]. Department of Health and Programmer, systems on Women's Health: [place] American  Pregnancy Association: www.americanpregnancy.org Contact a health care provider if: You continue to experience grief, sadness, or lack of motivation for everyday activities, and those feelings do not improve over time. You are struggling to recover emotionally, especially if you are using alcohol or substances to help. Get help right away if: You have thoughts of hurting yourself or others. If you ever feel like you may hurt yourself or others, or have thoughts about taking your own life, get help right away. You can go to your nearest emergency department or call: Your local emergency services (911 in the [place] A suicide crisis helpline, such as the [place] at 5853853553. This is open 24 hours a day. Summary Any pregnancy loss can be difficult physically and emotionally. You may experience many different emotions while you grieve. Emotional recovery can last longer than physical recovery. It is normal to have emotional stress after a pregnancy loss. But emotional stress that lasts a long time or becomes severe requires treatment. See your health care provider if you are struggling emotionally after a pregnancy loss. This information is not intended to replace advice given to you by your health care provider. Make sure you discuss any questions you have with your healthcare provider. Document Revised: 04/04/2019 Document Reviewed: 02/23/2018 Elsevier Patient Education  2022 [employer].

## 2021-07-09 NOTE — Progress Notes (Signed)
    GYNECOLOGY PROGRESS NOTE  Subjective:    Gabriela Thomas ID: Gabriela Thomas, female    DOB: February 20, 1981, 40 y.o.   MRN: [id number]  HPI  Gabriela Thomas is a 40 y.o. G22P1011 female who presents for 1 week f/u of miscarriage.  Gabriela Thomas notes that she recently stopped bleeding.  Was having fairly significant cramping for several days after her last visit, not relieved with OTC meds, but this has now resolved.   Gabriela Thomas also states she is feeling good on her increased dose of Procardia, feels BPs are better controlled.   The following portions of the Gabriela Thomas's history were reviewed and updated as appropriate: allergies, current medications, past family history, past medical history, past social history, past surgical history, and problem list.  Review of Systems Pertinent items noted in HPI and remainder of comprehensive ROS otherwise negative.   Objective:   Blood pressure 134/88, pulse (!) 103, height 5\' 4"  (1.626 m), weight 173 lb 1.6 oz (78.5 kg). General appearance: alert and no distress Exam deferred.   Assessment:   Miscarriage cHTN AMA  Plan:   - Discussion had with Gabriela Thomas regarding future plans. Notes that she desires to attempt to conceive again.  Advised to wait for at least 1 normal cycle to begin attempts again.   - Discussed emotional support, Gabriela Thomas reports she has appt with therapist today. Can also be given info on support groups if desired.  - Continue Procardia, also advised to begin daily baby aspirin.  - Advised if no conception within 6 months, to follow up, may aid with ovulation induction medications.  - FMLA paperwork completed today.  - Follow up as needed.    A total of 15 minutes were spent face-to-face with the Gabriela Thomas during this encounter and over half of that time dealt with counseling and coordination of care.   Rubie Maid, MD [facility]

## 2021-07-09 NOTE — Progress Notes (Signed)
Pt present follow up for miscarriage. Pt stated that she was doing well.

## 2021-07-10 LAB — BETA HCG QUANT (REF LAB): hCG Quant: 29 m[IU]/mL

## 2021-12-21 IMAGING — CT CT HEAD W/O CM
3 series · 16 of 47 positions shown, 19 images · non-contrast
Comparison: None

CLINICAL DATA: Acute headache, normal neurological exam,
hypertension, onset of headache yesterday accompanied by nausea

EXAM:
CT HEAD WITHOUT CONTRAST
TECHNIQUE: Contiguous axial images were obtained from the base of the skull
through the vertex without intravenous contrast. Sagittal and
coronal MPR images reconstructed from axial data set.

[Series 3: head wo · axial · 0.39mm/px · z∈[+217,+342]mm · 10 of 31 slices shown, 13 images]
[im 3/31  brain]
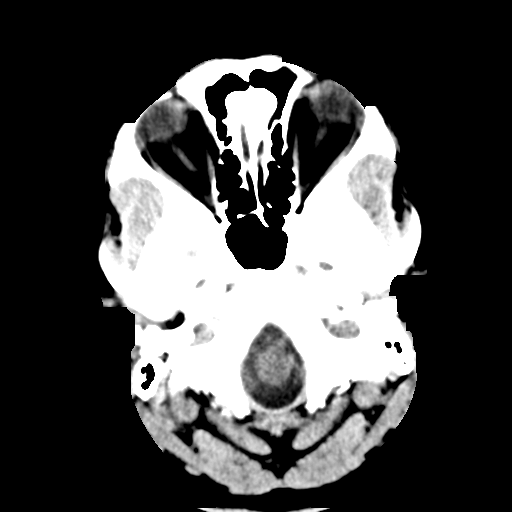
[im 3/31  bone]
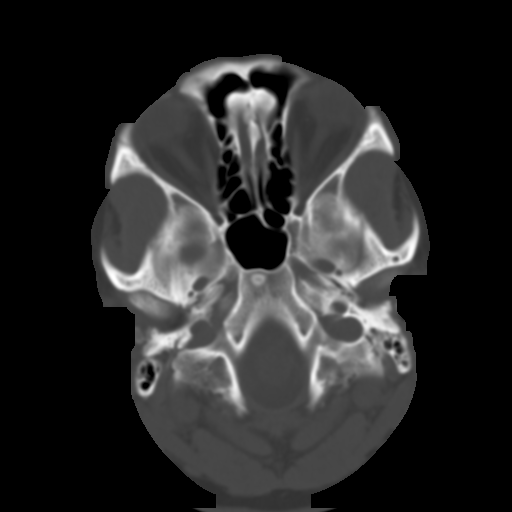
[im 6/31  brain]
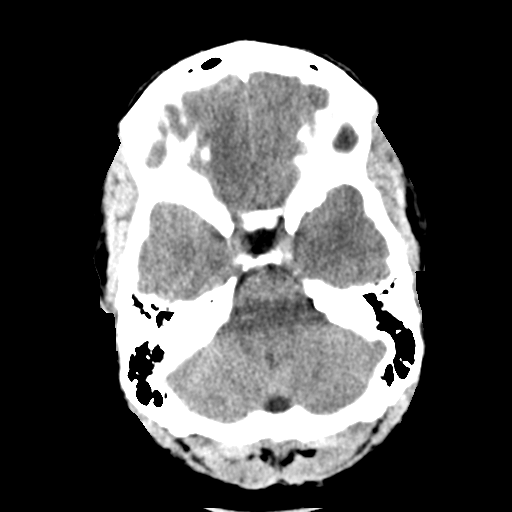
[im 9/31  brain]
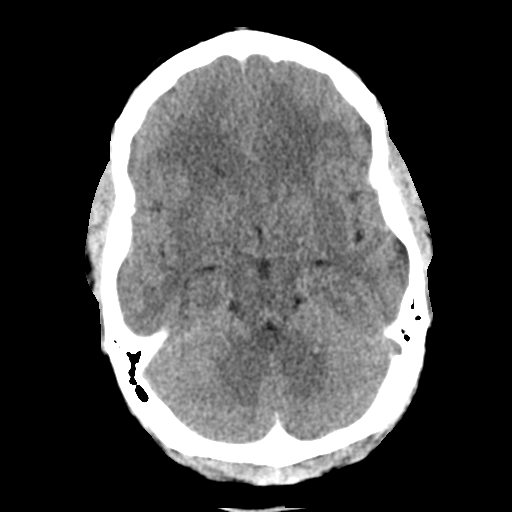
[im 11/31  brain]
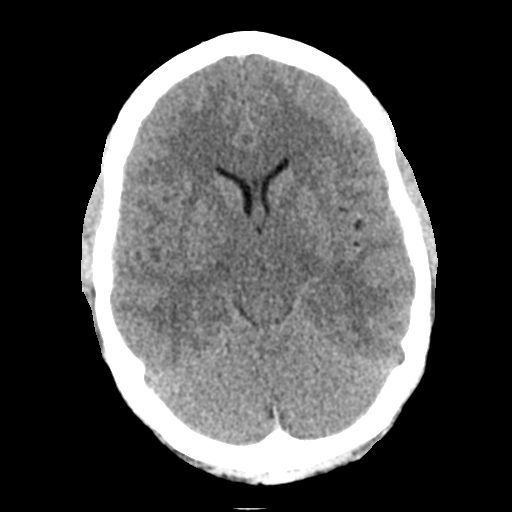
[im 14/31  brain]
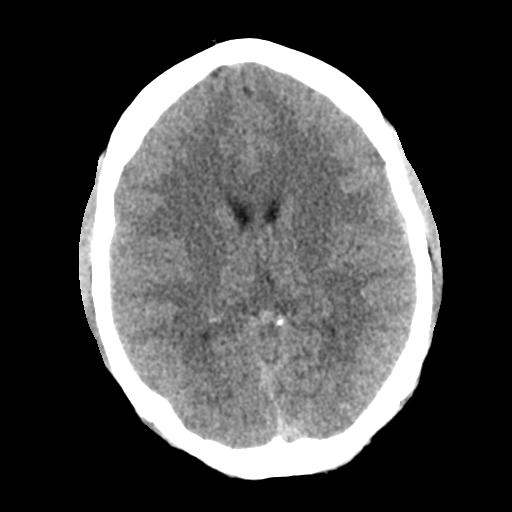
[im 14/31  bone]
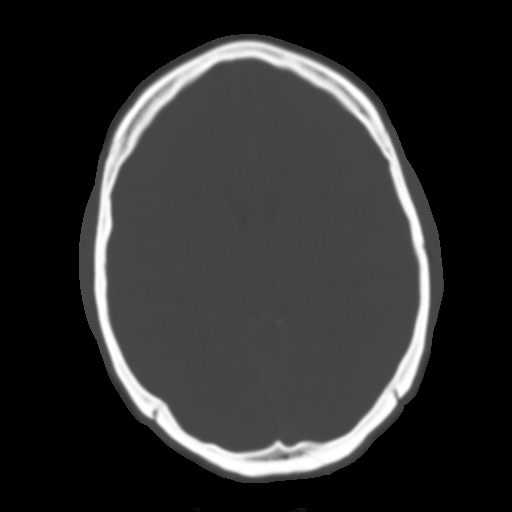
[im 17/31  brain]
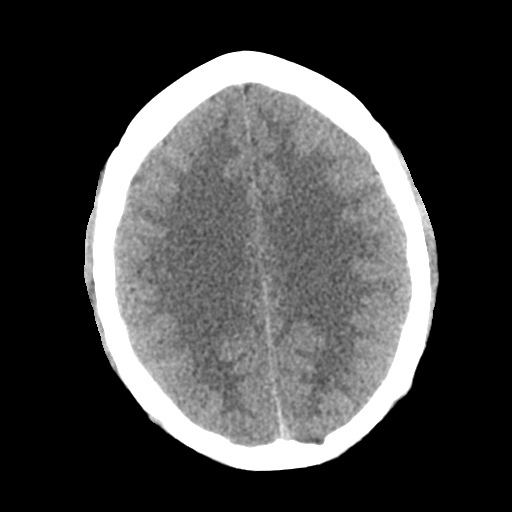
[im 20/31  brain]
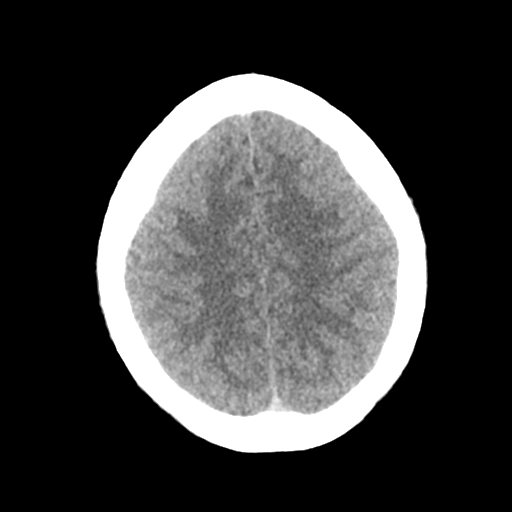
[im 23/31  brain]
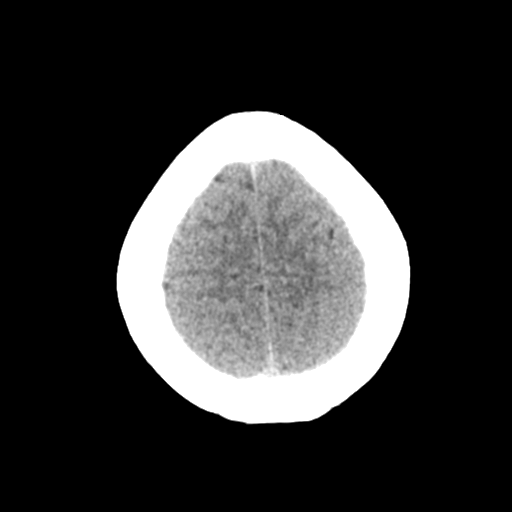
[im 25/31  brain]
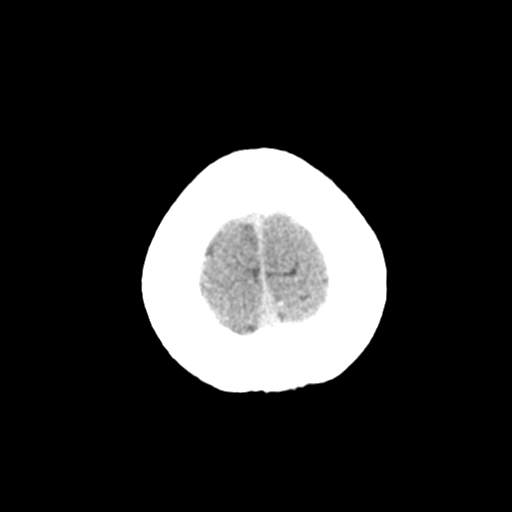
[im 25/31  bone]
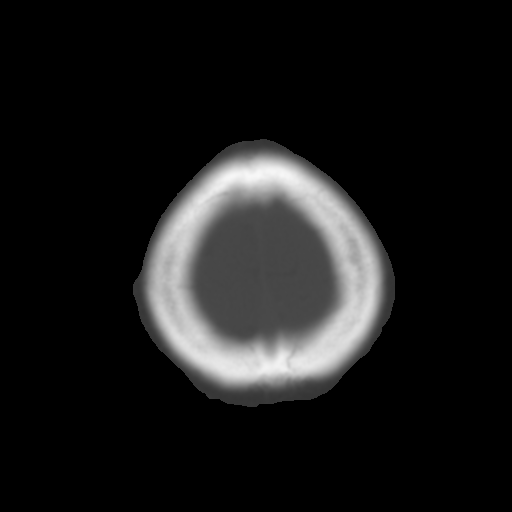
[im 28/31  brain]
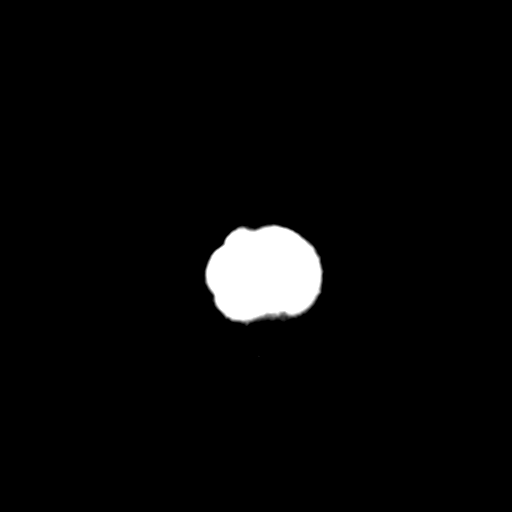

[Series 4: coronal soft tissue · coronal · 0.33mm/px · 3 of 63 slices shown]
[im 21/63  brain]
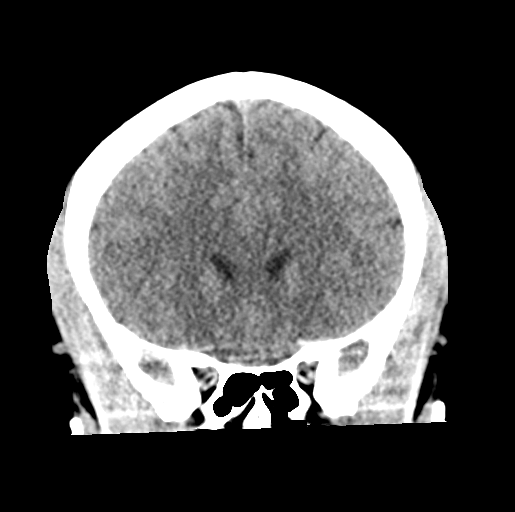
[im 28/63  brain]
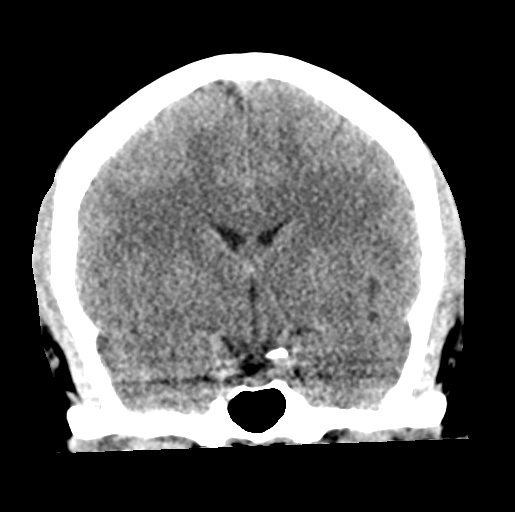
[im 35/63  brain]
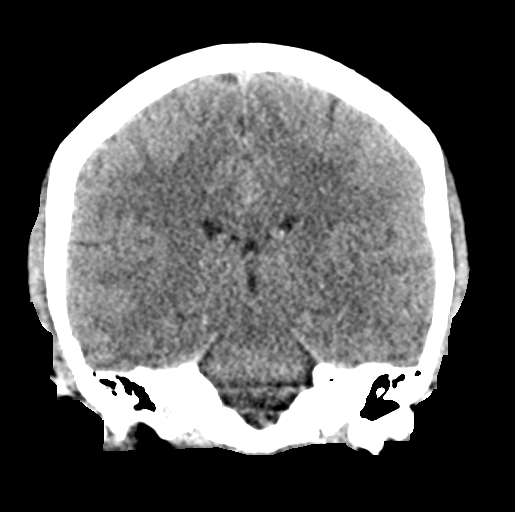

[Series 5: sagittal soft tissue · sagittal · 0.33mm/px · 3 of 57 slices shown]
[im 19/57  brain]
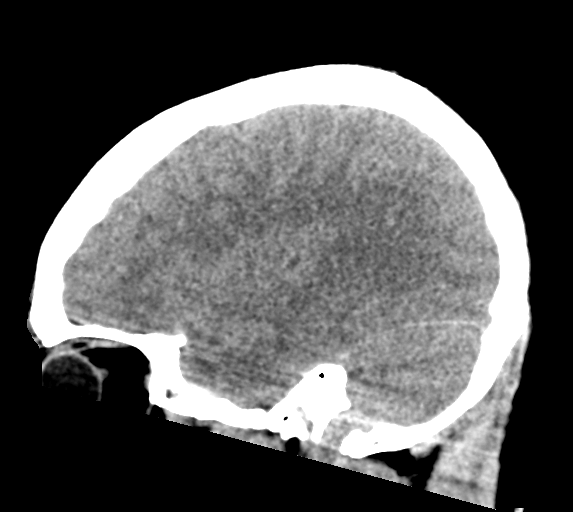
[im 29/57  brain]
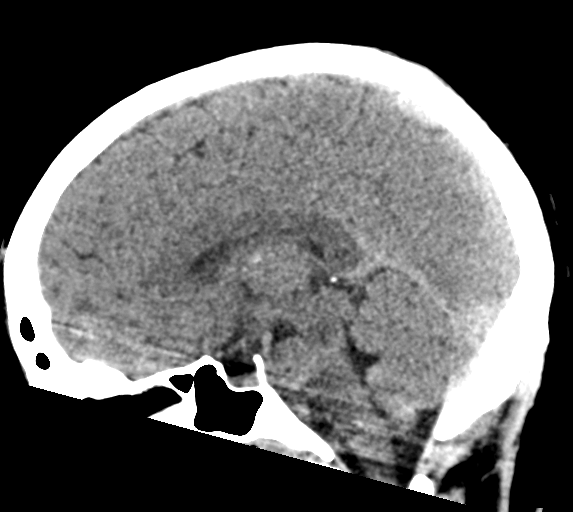
[im 38/57  brain]
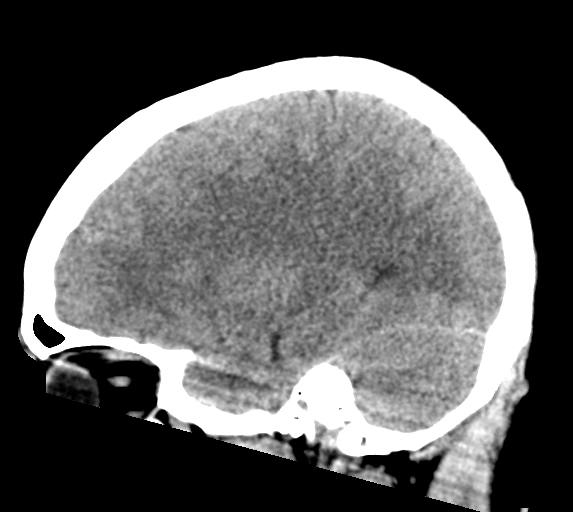

[16 of 47 positions shown; findings below may reference images not displayed]

FINDINGS: Brain: Normal ventricular morphology. No midline shift or mass
effect. Normal appearance of brain parenchyma. No intracranial
hemorrhage, mass lesion, evidence of acute infarction, or
extra-axial fluid collection.

Vascular: No hyperdense vessels

Skull: Intact

Sinuses/Orbits: Clear

Other: N/A
IMPRESSION: Normal exam.

## 2021-12-24 ENCOUNTER — Other Ambulatory Visit: Payer: Self-pay

## 2021-12-24 ENCOUNTER — Ambulatory Visit: Payer: BC Managed Care – PPO | Admitting: Obstetrics and Gynecology

## 2021-12-24 ENCOUNTER — Encounter: Payer: Self-pay | Admitting: Obstetrics and Gynecology

## 2021-12-24 VITALS — BP 124/86 | HR 84 | Resp 16 | Ht 64.5 in | Wt 174.3 lb

## 2021-12-24 DIAGNOSIS — N979 Female infertility, unspecified: Secondary | ICD-10-CM

## 2021-12-24 DIAGNOSIS — Z8759 Personal history of other complications of pregnancy, childbirth and the puerperium: Secondary | ICD-10-CM

## 2021-12-24 DIAGNOSIS — Z86018 Personal history of other benign neoplasm: Secondary | ICD-10-CM

## 2021-12-24 MED ORDER — LETROZOLE 2.5 MG PO TABS: 5.0000 mg | ORAL_TABLET | Freq: Every day | ORAL | 0 refills | Status: DC

## 2021-12-24 NOTE — Progress Notes (Signed)
° ° °  GYNECOLOGY PROGRESS NOTE  Subjective:    Patient ID: Gabriela Thomas, female    DOB: 04-29-1981, 40 y.o.   MRN: [id number]  HPI  Patient is a 40 y.o. G14P1011 female who presents to discuss her fertility. Notes that she is tracking her cycle, does not consume alcohol, performing ovulation kits which are noting positive tests.  Is having intercourse frequently before and after her cycle.  Of note, patient had miscarriage in July of this year.  Patient's last menstrual period was 12/24/2021 (exact date).  Cycles are regular, with mild dysmenorrhea, lasts 5 days q 24-25 days. Does have a h/o fibroids.   .   The following portions of the patient's history were reviewed and updated as appropriate: allergies, current medications, past family history, past medical history, past social history, past surgical history, and problem list.  Review of Systems Pertinent items noted in HPI and remainder of comprehensive ROS otherwise negative.   Objective:  Blood pressure 124/86, pulse 84, resp. rate 16, height 5' 4.5" (1.638 m), weight 174 lb 4.8 oz (79.1 kg), last menstrual period 12/24/2021.  Body mass index is 29.46 kg/m. General appearance: alert, cooperative, and no distress Remainder of exam deferred.    Assessment:   1. Female fertility problem   2. History of miscarriage   3. History of uterine fibroid      Plan:   Patient with h/o infertility, followed by spontaneous pregnancy with miscarriage.  Discussed option of ovulation induction, as patient had planned to start the same month spontaneous pregnancy occurred. Will prescribe Femara. Reviewed timed coitus and can continue ovulation kits. To f/u in 3 months if no pregnancy occurs.  H/o uterine fibroid, s/p myomectomy.  Will need Cesarean delivery.    A total of 15 minutes were spent face-to-face with the patient during this encounter and over half of that time dealt with counseling and coordination of care.   Rubie Maid,  MD [facility]

## 2021-12-24 NOTE — Patient Instructions (Addendum)
[place] PATIENT INSTRUCTIONS  WHY USE IT? [place] helps your ovaries to release eggs (ovulate).  HOW TO USE IT? FEMARA is taken as a pill usually on days 3,4,5,6, & 7 of your cycle.  Day 1 is the first day of your period. The dose or duration may be changed to achieve ovulation.  Provera (progesterone) may first be used to bring on a period for some patients.  If you do not get pregnant this cycle, for your next cycles, take on days 1, 2, 3, 4 and 5.   The day of ovulation on Femara is usually between cycle day 9 and 11.  Having sexual intercourse at least every other day between cycle day 10 and 16 will improve your chances of becoming pregnant during the Femara cycle.  You may monitor your ovulation using basal body temperature charts or with ovulation kits.  If using the ovulation predictor kits, having intercourse the day of the surge and the two days following is recommended. If you get your period, call when it starts for an appointment with your doctor, so that an exam may be done, and another Femara cycle can be considered if appropriate. If you do not get a period by day 35 of the cycle, please get a blood pregnancy test.  If it is negative, speak to your doctor for instructions to bring on another period and to plan a follow-up appointment.  THINGS TO KNOW: If you get pregnant while using Femara, your chance of twins is 7% and triplets is less than 1%. Some studies have suggested the use of "fertility drugs" may increase your risk of ovarian cancers in the future.  It is unclear if these drugs increase the risk, or people who have problems with fertility are prone for these cancers.  If there is an actual risk, it is very low.  If you have a history of liver problems or ovarian cancer, it may be wise to avoid this medication.  SIDE EFFECTS: The most common side effect is hot flashes (20%). Breast tenderness, headaches, nausea, bloating may also occur at different times. Less than 3/1,000  people have dryness or loss of hair. Persistent ovarian cysts may form from the use of this medication. Ovarian hyperstimulation syndrome is a rare side effect at low doses. Visual changes like flashes of light or blurring.

## 2022-03-08 ENCOUNTER — Telehealth: Payer: Self-pay | Admitting: Obstetrics and Gynecology

## 2022-03-08 MED ORDER — LETROZOLE 2.5 MG PO TABS: 7.5000 mg | ORAL_TABLET | Freq: Every day | ORAL | 1 refills | Status: DC

## 2022-03-08 NOTE — Telephone Encounter (Signed)
Pt called and stated that she has been taking fertility medications and was told by the physician that if she was not pregnant by march to call back. Pt was wondering if she needed to schedule an appt or if she needed more medication. Please advise.  ?

## 2022-03-08 NOTE — Telephone Encounter (Signed)
Please inform patient that I have sent in a new prescription with an increase in dosage.  She can take 7.5 mg with her next cycle, and then increase again to 10 mg with next 2 cycles. To begin medication on Day 5 of her cycle this time.  ?  ?

## 2022-04-13 ENCOUNTER — Telehealth: Payer: Self-pay | Admitting: Obstetrics and Gynecology

## 2022-04-13 ENCOUNTER — Other Ambulatory Visit: Payer: BC Managed Care – PPO

## 2022-04-13 DIAGNOSIS — O039 Complete or unspecified spontaneous abortion without complication: Secondary | ICD-10-CM

## 2022-04-13 NOTE — Telephone Encounter (Signed)
Pt called stating that she had a positive pregnancy test on the 15th, she started bleeding yesterday and no longer having breast soreness. Pt is concerned since she took a test and it is showing invalid results. Pt  was scheduled for a confirmation on 5-5 but is requesting labs today. Please advise.  ?

## 2022-04-14 ENCOUNTER — Encounter: Payer: Self-pay | Admitting: Obstetrics and Gynecology

## 2022-04-14 LAB — BETA HCG QUANT (REF LAB): hCG Quant: 4 m[IU]/mL

## 2022-04-29 NOTE — Progress Notes (Signed)
? ? ?  GYNECOLOGY PROGRESS NOTE ? ?Subjective:  ? ? Patient ID: Gabriela Thomas, female    DOB: [birth year], 41 y.o.   MRN: [id number] ? ?HPI ? Patient is a 41 y.o. G59P1011 female who presents for follow up of suspected chemical pregnancy with miscarriage. She had a positive pregnancy test at home, with an episode of bleeding several days later. She came in to have a beta HCG done on 04/13/2022 and it was a 4.Pt stated would like to discuss fertility issues as she believes she had early miscarriage. Is currently taking Femara for ovulation induction, currently on 7.5 mg dosing.   Patient's last menstrual period was 04/12/2022 (exact date). ? ? ?Pt would also like a refill of her BP medication as well.  ? ? ?The following portions of the patient's history were reviewed and updated as appropriate: allergies, current medications, past family history, past medical history, past social history, past surgical history, and problem list. ? ?Review of Systems ?Pertinent items noted in HPI and remainder of comprehensive ROS otherwise negative.  ? ?Objective:  ? There were no vitals taken for this visit. There is no height or weight on file to calculate BMI. ?General appearance: alert and no distress ?Remainder of exam deferred.  ? ? ?Assessment:  ? ?1. Miscarriage   ?2. Essential hypertension   ?  ? ?Plan:  ? ?- Possible miscarriage with chemical pregnancy.  Patient advised to continue Femara as instructed with onset of next cycle. Also discussed with patient regarding managing stress levels as she notes she is becoming very stressed and anxious about conceiving. To return if no pregnancy in the next 2 months or if conception occurs.  ?- Refill given on BP meds (Procardia). ? ? ?Rubie Maid, MD ?[facility] ? ?

## 2022-04-30 ENCOUNTER — Ambulatory Visit: Payer: BC Managed Care – PPO | Admitting: Obstetrics and Gynecology

## 2022-04-30 ENCOUNTER — Encounter: Payer: Self-pay | Admitting: Obstetrics and Gynecology

## 2022-04-30 ENCOUNTER — Other Ambulatory Visit: Payer: Self-pay

## 2022-04-30 VITALS — BP 139/95 | HR 81 | Ht 64.0 in | Wt 178.0 lb

## 2022-04-30 DIAGNOSIS — O039 Complete or unspecified spontaneous abortion without complication: Secondary | ICD-10-CM

## 2022-04-30 DIAGNOSIS — I10 Essential (primary) hypertension: Secondary | ICD-10-CM

## 2022-04-30 MED ORDER — NIFEDIPINE ER OSMOTIC RELEASE 90 MG PO TB24: 90.0000 mg | ORAL_TABLET | Freq: Every day | ORAL | 3 refills | Status: DC

## 2022-07-07 ENCOUNTER — Other Ambulatory Visit: Payer: Self-pay | Admitting: Obstetrics and Gynecology

## 2022-09-23 ENCOUNTER — Encounter: Payer: Self-pay | Admitting: Obstetrics and Gynecology

## 2022-10-01 ENCOUNTER — Other Ambulatory Visit: Payer: Self-pay | Admitting: Obstetrics and Gynecology

## 2022-10-04 ENCOUNTER — Ambulatory Visit: Payer: BC Managed Care – PPO

## 2022-10-05 ENCOUNTER — Telehealth: Payer: Self-pay

## 2022-10-05 ENCOUNTER — Ambulatory Visit (INDEPENDENT_AMBULATORY_CARE_PROVIDER_SITE_OTHER): Payer: BC Managed Care – PPO

## 2022-10-05 VITALS — BP 128/92 | HR 95 | Wt 173.0 lb

## 2022-10-05 DIAGNOSIS — Z3201 Encounter for pregnancy test, result positive: Secondary | ICD-10-CM | POA: Diagnosis not present

## 2022-10-05 DIAGNOSIS — O219 Vomiting of pregnancy, unspecified: Secondary | ICD-10-CM

## 2022-10-05 DIAGNOSIS — O09299 Supervision of pregnancy with other poor reproductive or obstetric history, unspecified trimester: Secondary | ICD-10-CM

## 2022-10-05 DIAGNOSIS — N926 Irregular menstruation, unspecified: Secondary | ICD-10-CM

## 2022-10-05 LAB — POCT URINE PREGNANCY: Preg Test, Ur: POSITIVE — AB

## 2022-10-05 MED ORDER — BONJESTA 20-20 MG PO TBCR: 20.0000 mg | EXTENDED_RELEASE_TABLET | Freq: Two times a day (BID) | ORAL | 5 refills | Status: DC

## 2022-10-05 NOTE — Addendum Note (Signed)
Addended by: Inis Sizer on: 10/05/2022 03:44 PM   Modules accepted: Orders

## 2022-10-05 NOTE — Progress Notes (Signed)
Subjective:    Gabriela Thomas is a 40 y.o. female who presents for evaluation of amenorrhea. She believes she could be pregnant. Pregnancy is desired.  Last period was normal.   Last LMP: 08/26/2022    Lab Review Urine HCG: positive       Plan:    Pregnancy Test:  Positive: EDC: 06/02/2023. Briefly discussed positive results and sent to check out for scheduling for New OB appointments.

## 2022-10-05 NOTE — Telephone Encounter (Signed)
Yes, can send in [place].  Also, she can use Doxylamine and Vitamin B6.  I hate that I didn't know she was here today.  This patient needs a progesterone level due to her history of miscarriage and conception using fertility medications.  I need to know if she needs to be supplemented so she doesn't experience another miscarriage. Please see if she can come in sometime again this week for a lab.

## 2022-10-05 NOTE — Telephone Encounter (Signed)
Gabriela Thomas was present today for pregnancy confirmation, she is positive.   She stated that she's very nausea. Can we send something in for her?

## 2022-10-05 NOTE — Telephone Encounter (Signed)
Will do. Thanks.

## 2022-10-06 ENCOUNTER — Other Ambulatory Visit: Payer: BC Managed Care – PPO

## 2022-10-06 DIAGNOSIS — O09299 Supervision of pregnancy with other poor reproductive or obstetric history, unspecified trimester: Secondary | ICD-10-CM

## 2022-10-07 LAB — BETA HCG QUANT (REF LAB): hCG Quant: 39527 m[IU]/mL

## 2022-10-07 LAB — PROGESTERONE: Progesterone: 19.9 ng/mL

## 2022-10-11 ENCOUNTER — Telehealth: Payer: Self-pay

## 2022-10-11 NOTE — Telephone Encounter (Signed)
Prior authorization was initiated and approved on today. Approved dates 10/11/2022-07/12/2023. [place] Reference #: [id number].

## 2022-10-12 ENCOUNTER — Encounter: Payer: Self-pay | Admitting: Obstetrics and Gynecology

## 2022-10-25 ENCOUNTER — Ambulatory Visit (INDEPENDENT_AMBULATORY_CARE_PROVIDER_SITE_OTHER): Payer: BC Managed Care – PPO

## 2022-10-25 VITALS — Wt 170.0 lb

## 2022-10-25 DIAGNOSIS — O0990 Supervision of high risk pregnancy, unspecified, unspecified trimester: Secondary | ICD-10-CM | POA: Insufficient documentation

## 2022-10-25 DIAGNOSIS — Z3480 Encounter for supervision of other normal pregnancy, unspecified trimester: Secondary | ICD-10-CM | POA: Insufficient documentation

## 2022-10-25 DIAGNOSIS — Z369 Encounter for antenatal screening, unspecified: Secondary | ICD-10-CM

## 2022-10-25 DIAGNOSIS — Z3689 Encounter for other specified antenatal screening: Secondary | ICD-10-CM

## 2022-10-25 NOTE — Progress Notes (Signed)
New OB Intake  I connected with  Gabriela Thomas on 10/25/22 at  8:15 AM EDT by telephone and verified that I am speaking with the correct person using two identifiers. Nurse is located at [employer] [employer] and pt is located at home.  I explained I am completing New OB Intake today. We discussed her EDD of 06/02/2023 that is based on LMP of 08/26/2022. Pt is G4/P1021. Pt got pregnant without the help of letrazole.  I reviewed her allergies, medications, Medical/Surgical/OB history, and appropriate screenings. Based on history, this is a/an pregnancy uncomplicated .   Patient Active Problem List   Diagnosis Date Noted   History of uterine fibroid 04/29/2021   Overweight (BMI 25.0-29.9) 04/29/2021   S/P myomectomy 12/11/2018   Essential hypertension 11/19/2018   Intramural leiomyoma of uterus 11/15/2018    Concerns addressed today None  Delivery Plans:  Plans to deliver at [facility].  Would very much like Dr. Marcelline Mates to deliver her.  Anatomy US Explained first scheduled [place] will be scheduled soon and an anatomy scan will be scheduled at 20 weeks.  Labs Discussed genetic screening with patient. Patient desires genetic testing to be drawn on or after 10weeks.  Discussed possible labs to be drawn at new OB appointment.  COVID Vaccine Patient has had COVID vaccine.   Social Determinants of Health Food Insecurity: denies food insecurity Transportation: Patient denies transportation needs.  First visit review I reviewed new OB appt with pt. I explained she will have ob bloodwork and pap smear/pelvic exam if indicated. Explained pt will be seen by Dr. Ludwig Lean at first visit; encounter routed to appropriate provider.   Cleophas Dunker, [facility] 10/25/2022  8:52 AM

## 2022-11-01 ENCOUNTER — Other Ambulatory Visit: Payer: BC Managed Care – PPO

## 2022-11-01 DIAGNOSIS — Z3480 Encounter for supervision of other normal pregnancy, unspecified trimester: Secondary | ICD-10-CM

## 2022-11-01 DIAGNOSIS — Z369 Encounter for antenatal screening, unspecified: Secondary | ICD-10-CM

## 2022-11-08 LAB — CBC/D/PLT+RPR+RH+ABO+RUBIGG...
Antibody Screen: NEGATIVE
Basophils Absolute: 0 10*3/uL (ref 0.0–0.2)
Basos: 0 %
EOS (ABSOLUTE): 0.1 10*3/uL (ref 0.0–0.4)
Eos: 1 %
HCV Ab: NONREACTIVE
HIV Screen 4th Generation wRfx: NONREACTIVE
Hematocrit: 37.6 % (ref 34.0–46.6)
Hemoglobin: 13 g/dL (ref 11.1–15.9)
Hepatitis B Surface Ag: NEGATIVE
Immature Grans (Abs): 0 10*3/uL (ref 0.0–0.1)
Immature Granulocytes: 0 %
Lymphocytes Absolute: 2 10*3/uL (ref 0.7–3.1)
Lymphs: 35 %
MCH: 32.3 pg (ref 26.6–33.0)
MCHC: 34.6 g/dL (ref 31.5–35.7)
MCV: 94 fL (ref 79–97)
Monocytes Absolute: 0.3 10*3/uL (ref 0.1–0.9)
Monocytes: 5 %
Neutrophils Absolute: 3.3 10*3/uL (ref 1.4–7.0)
Neutrophils: 59 %
Platelets: 202 10*3/uL (ref 150–450)
RBC: 4.02 x10E6/uL (ref 3.77–5.28)
RDW: 12.9 % (ref 11.7–15.4)
RPR Ser Ql: NONREACTIVE
Rh Factor: NEGATIVE
Rubella Antibodies, IGG: 5.26 index (ref >0.99)
Varicella zoster IgG: 2376 index (ref >165)
WBC: 5.6 10*3/uL (ref 3.4–10.8)

## 2022-11-08 LAB — MATERNIT 21 PLUS CORE, BLOOD
Fetal Fraction: 6
Result (T21): NEGATIVE
Trisomy 13 (Patau syndrome): NEGATIVE
Trisomy 18 (Edwards syndrome): NEGATIVE
Trisomy 21 (Down syndrome): NEGATIVE

## 2022-11-08 LAB — HCV INTERPRETATION

## 2022-11-09 ENCOUNTER — Ambulatory Visit (INDEPENDENT_AMBULATORY_CARE_PROVIDER_SITE_OTHER): Payer: BC Managed Care – PPO

## 2022-11-09 ENCOUNTER — Encounter: Payer: Self-pay | Admitting: Obstetrics and Gynecology

## 2022-11-09 ENCOUNTER — Ambulatory Visit (INDEPENDENT_AMBULATORY_CARE_PROVIDER_SITE_OTHER): Payer: BC Managed Care – PPO | Admitting: Obstetrics and Gynecology

## 2022-11-09 ENCOUNTER — Other Ambulatory Visit: Payer: BC Managed Care – PPO

## 2022-11-09 ENCOUNTER — Other Ambulatory Visit (HOSPITAL_COMMUNITY)
Admission: RE | Admit: 2022-11-09 | Discharge: 2022-11-09 | Disposition: A | Discharge status: Home / Self Care | Payer: BC Managed Care – PPO | Source: Ambulatory Visit | Attending: Obstetrics and Gynecology | Admitting: Obstetrics and Gynecology

## 2022-11-09 VITALS — BP 122/79 | HR 92 | Wt 169.5 lb

## 2022-11-09 DIAGNOSIS — Z3A11 11 weeks gestation of pregnancy: Secondary | ICD-10-CM | POA: Diagnosis not present

## 2022-11-09 DIAGNOSIS — O09521 Supervision of elderly multigravida, first trimester: Secondary | ICD-10-CM

## 2022-11-09 DIAGNOSIS — O3429 Maternal care due to uterine scar from other previous surgery: Secondary | ICD-10-CM | POA: Insufficient documentation

## 2022-11-09 DIAGNOSIS — Z3A10 10 weeks gestation of pregnancy: Secondary | ICD-10-CM

## 2022-11-09 DIAGNOSIS — Z3687 Encounter for antenatal screening for uncertain dates: Secondary | ICD-10-CM | POA: Diagnosis not present

## 2022-11-09 DIAGNOSIS — Z124 Encounter for screening for malignant neoplasm of cervix: Secondary | ICD-10-CM | POA: Diagnosis present

## 2022-11-09 DIAGNOSIS — I10 Essential (primary) hypertension: Secondary | ICD-10-CM

## 2022-11-09 DIAGNOSIS — O10011 Pre-existing essential hypertension complicating pregnancy, first trimester: Secondary | ICD-10-CM | POA: Diagnosis not present

## 2022-11-09 DIAGNOSIS — Z369 Encounter for antenatal screening, unspecified: Secondary | ICD-10-CM

## 2022-11-09 DIAGNOSIS — O34219 Maternal care for unspecified type scar from previous cesarean delivery: Secondary | ICD-10-CM | POA: Diagnosis not present

## 2022-11-09 DIAGNOSIS — Z8742 Personal history of other diseases of the female genital tract: Secondary | ICD-10-CM

## 2022-11-09 DIAGNOSIS — O0990 Supervision of high risk pregnancy, unspecified, unspecified trimester: Secondary | ICD-10-CM

## 2022-11-09 DIAGNOSIS — O09291 Supervision of pregnancy with other poor reproductive or obstetric history, first trimester: Secondary | ICD-10-CM | POA: Diagnosis not present

## 2022-11-09 DIAGNOSIS — Z3480 Encounter for supervision of other normal pregnancy, unspecified trimester: Secondary | ICD-10-CM

## 2022-11-09 NOTE — Progress Notes (Signed)
ROB [redacted]w[redacted]d She is doing well today. She has no new concerns.

## 2022-11-09 NOTE — Progress Notes (Signed)
OBSTETRIC INITIAL PRENATAL VISIT  Subjective:    Gabriela Thomas is being seen today for her first obstetrical visit.  This is a planned pregnancy. Has a history of secondary infertility. Was prescribed Letrazole, but notes that she conceived on her own as she did not take the medication. She is a 41 y.o. K3T4656 female at 4w5dgestation, Estimated Date of Delivery: 06/02/23 with Patient's last menstrual period was 08/26/2022 (exact date). Her obstetrical history is significant for chronic HTN (on Procardia), advanced maternal age and history of myomectomy . Relationship with FOB: spouse, living together. Patient does intend to breast feed. Pregnancy history fully reviewed.    OB History  Gravida Para Term Preterm AB Living  '4 1 1 '$ 0 2 1  SAB IAB Ectopic Multiple Live Births  2 0 0 0 1    # Outcome Date GA Lbr Len/2nd Weight Sex Delivery Anes PTL Lv  4 Current           3 SAB 04/10/22     SAB     2 SAB 06/2021     SAB     1 Term 10/15/98 387w0d5 lb 5 oz (2.41 kg) F Vag-Spont  N LIV    Gynecologic History:  Last pap smear was 09/11/2019.  Results were Normal.  Denies h/o abnormal pap smears in the past.  Denies history of STIs.  Contraception prior to conception:    Past Medical History:  Diagnosis Date   Anxiety    Asthma    AS A CHILD -NO INHALERS   Fibroid    GERD (gastroesophageal reflux disease)    OCC   Headache    Hypertension    Macromastia    Miscarriage    PONV (postoperative nausea and vomiting)     Family History  Problem Relation Age of Onset   Hypertension Mother    Diabetes Mother    Congestive Heart Failure Mother    Obesity Mother    Alcohol abuse Father    Hypertension Maternal Grandfather    Hypertension Maternal Aunt    Hypertension Maternal Uncle     Past Surgical History:  Procedure Laterality Date   BREAST REDUCTION SURGERY Bilateral 07/14/2020   Procedure: MAMMARY REDUCTION  (BREAST);  Surgeon: PaCindra PresumeMD;  [facility] Service: Plastics;  Laterality: Bilateral;   LAPAROTOMY N/A 12/12/2018   Procedure: EXPLORATORY LAPAROTOMY, evacuation of hemoperitoneum, aspiration of left adnexal cyst;  Surgeon: ChRubie MaidMD;  Location: [facility];  Service: Gynecology;  Laterality: N/A;   MYOMECTOMY N/A 12/11/2018   Procedure: ABDOMINAL MYOMECTOMY;  Surgeon: ChRubie MaidMD;  Location: [facility];  Service: Gynecology;  Laterality: N/A;    Social History   Socioeconomic History   Marital status: Single    Spouse name: Not on file   Number of children: 1   Years of education: 16   Highest education level: Not on file  Occupational History   Occupation: maFreight forwardert [employer]   Smoking status: Never   Smokeless tobacco: Never  Vaping Use   Vaping Use: Never used  Substance and Sexual Activity   Alcohol use: Not Currently    Alcohol/week: 7.0 standard drinks of alcohol    Types: 7 Glasses of wine per week   Drug use: No   Sexual activity: Yes    Partners: Male    Birth control/protection: None  Other Topics Concern   Not on file  Social History  Narrative   Not on file   Social Determinants of Health   Financial Resource Strain: Low Risk  (10/25/2022)   Overall Financial Resource Strain (CARDIA)    Difficulty of Paying Living Expenses: Not hard at all  Food Insecurity: No Food Insecurity (10/25/2022)   Hunger Vital Sign    Worried About Running Out of Food in the Last Year: Never true    Ran Out of Food in the Last Year: Never true  Transportation Needs: No Transportation Needs (10/25/2022)   PRAPARE - Hydrologist (Medical): No    Lack of Transportation (Non-Medical): No  Physical Activity: Insufficiently Active (10/25/2022)   Exercise Vital Sign    Days of Exercise per Week: 3 days    Minutes of Exercise per Session: 30 min  Stress: No Stress Concern Present (10/25/2022)   [place]    Feeling of Stress : Not at all  Social Connections: Moderately Isolated (10/25/2022)   Social Connection and Isolation Panel [NHANES]    Frequency of Communication with Friends and Family: More than three times a week    Frequency of Social Gatherings with Friends and Family: Not on file    Attends Religious Services: Never    Marine scientist or Organizations: No    Attends Archivist Meetings: Never    Marital Status: Living with partner  Intimate Partner Violence: Not At Risk (10/25/2022)   Humiliation, Afraid, Rape, and Kick questionnaire    Fear of Current or Ex-Partner: No    Emotionally Abused: No    Physically Abused: No    Sexually Abused: No    Current Outpatient Medications on File Prior to Visit  Medication Sig Dispense Refill   aspirin 81 MG chewable tablet Chew by mouth daily.     Doxylamine-Pyridoxine ER (BONJESTA) 20-20 MG TBCR Take 20 mg by mouth 2 (two) times daily. 60 tablet 5   folic acid (FOLVITE) 209 MCG tablet Take 800 mcg by mouth daily.     NIFEdipine (PROCARDIA XL/NIFEDICAL-XL) 90 MG 24 hr tablet Take 1 tablet (90 mg total) by mouth daily. 90 tablet 3   Prenatal Vit-Fe Fumarate-FA (MULTIVITAMIN-PRENATAL) 27-0.8 MG TABS tablet Take 1 tablet by mouth daily at 12 noon.     No current facility-administered medications on file prior to visit.    No Known Allergies   Review of Systems General: Not Present- Fever, Weight Loss and Weight Gain. Skin: Not Present- Rash. HEENT: Not Present- Blurred Vision, Headache and Bleeding Gums. Respiratory: Not Present- Difficulty Breathing. Breast: Not Present- Breast Mass. Positive for tenderness Cardiovascular: Not Present- Chest Pain, Elevated Blood Pressure, Fainting / Blacking Out and Shortness of Breath. Gastrointestinal: Not Present- Abdominal Pain, Constipation, Nausea and Vomiting. Female Genitourinary: Not Present- Frequency, Painful Urination, Pelvic Pain, Vaginal Bleeding,  Vaginal Discharge, Contractions, regular, Fetal Movements Decreased, Urinary Complaints and Vaginal Fluid. Musculoskeletal: Not Present- Back Pain and Leg Cramps. Neurological: Not Present- Dizziness. Psychiatric: Not Present- Depression.     Objective:   Blood pressure 122/79, pulse 92, weight 169 lb 8 oz (76.9 kg), last menstrual period 08/26/2022.   Body mass index is 29.09 kg/m.  General Appearance:    Alert, cooperative, no distress, appears stated age, overweight.   Head:    Normocephalic, without obvious abnormality, atraumatic  Eyes:    PERRL, conjunctiva/corneas clear, EOM's intact, both eyes  Ears:    Normal external ear canals, both ears  Nose:  Nares normal, septum midline, mucosa normal, no drainage or sinus tenderness  Throat:   Lips, mucosa, and tongue normal; teeth and gums normal  Neck:   Supple, symmetrical, trachea midline, no adenopathy; thyroid: no enlargement/tenderness/nodules; no carotid bruit or JVD  Back:     Symmetric, no curvature, ROM normal, no CVA tenderness  Lungs:     Clear to auscultation bilaterally, respirations unlabored  Chest Wall:    No tenderness or deformity   Heart:    Regular rate and rhythm, S1 and S2 normal, no murmur, rub or gallop  Breast Exam:    No tenderness, masses, or nipple abnormality  Abdomen:     Soft, non-tender, bowel sounds active all four quadrants, no masses, no organomegaly.  FHT160  bpm.  Genitalia:    Pelvic:external genitalia normal, vagina without lesions, discharge, or tenderness, rectovaginal septum  normal. Cervix normal in appearance, no cervical motion tenderness, no adnexal masses or tenderness.  Pregnancy positive findings: uterine enlargement: 11 wk size, nontender.   Rectal:    Normal external sphincter.  No hemorrhoids appreciated. Internal exam not done.   Extremities:   Extremities normal, atraumatic, no cyanosis or edema  Pulses:   2+ and symmetric all extremities  Skin:   Skin color, texture, turgor normal,  no rashes or lesions  Lymph nodes:   Cervical, supraclavicular, and axillary nodes normal  Neurologic:   CNII-XII intact, normal strength, sensation and reflexes throughout     Assessment:   1. Supervision of high risk pregnancy, antepartum   2. [redacted] weeks gestation of pregnancy   3. Essential hypertension   4. Pregnancy with history of uterine myomectomy   5. History of infertility   6. AMA (advanced maternal age) multigravida 29+, first trimester   7. Cervical cancer screening     Plan:   1. Supervision of high risk pregnancy, antepartum - Initial labs reviewed. - Prenatal vitamins encouraged. - Problem list reviewed and updated. - New OB counseling:  The patient has been given an overview regarding routine prenatal care.  Recommendations regarding diet, weight gain, and exercise in pregnancy were given. - Prenatal testing, optional genetic testing, and ultrasound use in pregnancy were reviewed.  Traditional genetic screening vs cell-fee DNA genetic screening discussed, including risks and benefits. Testing results reviewed. - Benefits of Breast Feeding were discussed. The patient is encouraged to consider nursing her baby post partum.   2. Essential hypertension - Currently controlled, on 90 mg of Procardia.  - To order baseline [place] labs next visit.  - To begin daily baby aspirin between 12-[redacted] weeks gestation - For serial growth scans and antenatal testing in 3rd trimester.   3. Pregnancy with history of uterine myomectomy - Discussed with patient that based on extent of myomectomy, would recommend Cesarean delivery at 37-[redacted] weeks gestation.   4. History of infertility - Patient with h/o infertility, followed by 2 1st trimester miscarriages over the past year. Prescribed Letrazole but was able to conceive on her own for this pregnancy.   5. AMA (advanced maternal age) multigravida 81+, first trimester - Patient with normal genetic testing.   - For antenatal testing in third  trimester.   6.  Cervical cancer screening - Pap smear performed today for screening.    Follow up in 4 weeks.    Rubie Maid, MD [facility] at [facility]

## 2022-11-15 LAB — CYTOLOGY - PAP
Comment: NEGATIVE
Diagnosis: NEGATIVE
High risk HPV: NEGATIVE

## 2022-12-08 ENCOUNTER — Ambulatory Visit (INDEPENDENT_AMBULATORY_CARE_PROVIDER_SITE_OTHER): Payer: BC Managed Care – PPO | Admitting: Obstetrics and Gynecology

## 2022-12-08 ENCOUNTER — Encounter: Payer: Self-pay | Admitting: Obstetrics and Gynecology

## 2022-12-08 VITALS — BP 130/90 | HR 90 | Wt 173.4 lb

## 2022-12-08 DIAGNOSIS — I10 Essential (primary) hypertension: Secondary | ICD-10-CM

## 2022-12-08 DIAGNOSIS — O0990 Supervision of high risk pregnancy, unspecified, unspecified trimester: Secondary | ICD-10-CM

## 2022-12-08 DIAGNOSIS — Z3480 Encounter for supervision of other normal pregnancy, unspecified trimester: Secondary | ICD-10-CM

## 2022-12-08 DIAGNOSIS — Z3A14 14 weeks gestation of pregnancy: Secondary | ICD-10-CM

## 2022-12-08 DIAGNOSIS — O09522 Supervision of elderly multigravida, second trimester: Secondary | ICD-10-CM

## 2022-12-08 LAB — POCT URINALYSIS DIPSTICK OB
Bilirubin, UA: NEGATIVE
Blood, UA: NEGATIVE
Glucose, UA: NEGATIVE
Ketones, UA: NEGATIVE
Leukocytes, UA: NEGATIVE
Nitrite, UA: NEGATIVE
Spec Grav, UA: 1.025 (ref 1.010–1.025)
Urobilinogen, UA: 0.2 E.U./dL
pH, UA: 6 (ref 5.0–8.0)

## 2022-12-08 NOTE — Progress Notes (Signed)
ROB 14.6w: She is doing well. She has no new concerns today.

## 2022-12-08 NOTE — Progress Notes (Signed)
ROB: Doing well. Has questions about travel in pregnancy. Answered all questions. Patient with normal MaterniT21.  For AFP next visit. Also to perform baseline [place] labs then.  Is taking daily aspirin. For anatomy scan. RTC in 4 weeks.

## 2023-01-03 ENCOUNTER — Other Ambulatory Visit: Payer: Self-pay | Admitting: Obstetrics and Gynecology

## 2023-01-03 DIAGNOSIS — Z363 Encounter for antenatal screening for malformations: Secondary | ICD-10-CM

## 2023-01-05 ENCOUNTER — Ambulatory Visit (INDEPENDENT_AMBULATORY_CARE_PROVIDER_SITE_OTHER): Payer: BC Managed Care – PPO | Admitting: Obstetrics and Gynecology

## 2023-01-05 ENCOUNTER — Ambulatory Visit (INDEPENDENT_AMBULATORY_CARE_PROVIDER_SITE_OTHER): Payer: BC Managed Care – PPO

## 2023-01-05 VITALS — BP 124/79 | HR 88 | Wt 173.5 lb

## 2023-01-05 DIAGNOSIS — O10919 Unspecified pre-existing hypertension complicating pregnancy, unspecified trimester: Secondary | ICD-10-CM

## 2023-01-05 DIAGNOSIS — O26899 Other specified pregnancy related conditions, unspecified trimester: Secondary | ICD-10-CM | POA: Insufficient documentation

## 2023-01-05 DIAGNOSIS — Z3A19 19 weeks gestation of pregnancy: Secondary | ICD-10-CM

## 2023-01-05 DIAGNOSIS — Z1379 Encounter for other screening for genetic and chromosomal anomalies: Secondary | ICD-10-CM

## 2023-01-05 DIAGNOSIS — Z363 Encounter for antenatal screening for malformations: Secondary | ICD-10-CM

## 2023-01-05 DIAGNOSIS — Z6791 Unspecified blood type, Rh negative: Secondary | ICD-10-CM | POA: Insufficient documentation

## 2023-01-05 DIAGNOSIS — O10012 Pre-existing essential hypertension complicating pregnancy, second trimester: Secondary | ICD-10-CM

## 2023-01-05 DIAGNOSIS — O132 Gestational [pregnancy-induced] hypertension without significant proteinuria, second trimester: Secondary | ICD-10-CM

## 2023-01-05 DIAGNOSIS — Z3A18 18 weeks gestation of pregnancy: Secondary | ICD-10-CM

## 2023-01-05 DIAGNOSIS — O09522 Supervision of elderly multigravida, second trimester: Secondary | ICD-10-CM

## 2023-01-05 DIAGNOSIS — O0990 Supervision of high risk pregnancy, unspecified, unspecified trimester: Secondary | ICD-10-CM

## 2023-01-05 LAB — POCT URINALYSIS DIPSTICK OB
Bilirubin, UA: NEGATIVE
Blood, UA: NEGATIVE
Glucose, UA: NEGATIVE
Ketones, UA: NEGATIVE
Leukocytes, UA: NEGATIVE
Nitrite, UA: NEGATIVE
POC,PROTEIN,UA: NEGATIVE
Spec Grav, UA: 1.02 (ref 1.010–1.025)
Urobilinogen, UA: 0.2 E.U./dL
pH, UA: 6 (ref 5.0–8.0)

## 2023-01-05 NOTE — Progress Notes (Signed)
ROB: Patient is a 42 y.o. [id number] female at 25w6dwho presents for routine care. Doing well overall, notes nausea has resolved. Had anatomy scan, normal.  For AFP today, will also get baseline Pre-E labs due to h/o cHTN, currently stable on Procardia.   RTC in 4 weeks.

## 2023-01-08 LAB — AFP, SERUM, OPEN SPINA BIFIDA
AFP MoM: 0.97
AFP Value: 48.3 ng/mL
Gest. Age on Collection Date: 18.9 weeks
Maternal Age At EDD: 42.3 yr
OSBR Risk 1 IN: 10000
Test Results:: NEGATIVE
Weight: 173 [lb_av]

## 2023-01-08 LAB — COMPREHENSIVE METABOLIC PANEL
ALT: 37 IU/L — ABNORMAL HIGH (ref 0–32)
AST: 30 IU/L (ref 0–40)
Albumin/Globulin Ratio: 1.5 (ref 1.2–2.2)
Albumin: 3.6 g/dL — ABNORMAL LOW (ref 3.9–4.9)
Alkaline Phosphatase: 53 IU/L (ref 44–121)
BUN/Creatinine Ratio: 13 (ref 9–23)
BUN: 10 mg/dL (ref 6–24)
Bilirubin Total: 0.4 mg/dL (ref 0.0–1.2)
CO2: 18 mmol/L — ABNORMAL LOW (ref 20–29)
Calcium: 9 mg/dL (ref 8.7–10.2)
Chloride: 104 mmol/L (ref 96–106)
Creatinine, Ser: 0.8 mg/dL (ref 0.57–1.00)
Globulin, Total: 2.4 g/dL (ref 1.5–4.5)
Glucose: 70 mg/dL (ref 70–99)
Potassium: 3.8 mmol/L (ref 3.5–5.2)
Sodium: 135 mmol/L (ref 134–144)
Total Protein: 6 g/dL (ref 6.0–8.5)
eGFR: 95 mL/min/{1.73_m2} (ref >59)

## 2023-01-08 LAB — PROTEIN / CREATININE RATIO, URINE
Creatinine, Urine: 104.1 mg/dL
Protein, Ur: 17.2 mg/dL
Protein/Creat Ratio: 165 mg/g creat (ref 0–200)

## 2023-01-11 ENCOUNTER — Telehealth: Payer: Self-pay

## 2023-01-11 NOTE — Telephone Encounter (Signed)
Called Ms. Olicer from where she called the doctor on demand with a really bad cough, no other symptoms.   I called Ms. Ricardo today to follow up with her she still has a cough is she taking some Deslym. I told her I will send over the safety medication list to use while during the pregnancy.   Told Ms. Caprara if things get worse to please be seen at her PCP or walk in clinic. She understood.

## 2023-01-12 ENCOUNTER — Encounter: Payer: Self-pay | Admitting: Obstetrics and Gynecology

## 2023-01-13 NOTE — Telephone Encounter (Signed)
Pt missed a call; would like a call back.  [phone]

## 2023-01-14 MED ORDER — NIRMATRELVIR/RITONAVIR (PAXLOVID)TABLET: 3.0000 | ORAL_TABLET | Freq: Two times a day (BID) | ORAL | 0 refills | Status: AC

## 2023-01-25 IMAGING — US US OB < 14 WEEKS - US OB TV
1 series · 14 of 28 positions shown · non-contrast
Comparison: None.

CLINICAL DATA: Nine weeks pregnant, fall on [REDACTED], vaginal
bleeding on [REDACTED]

EXAM:
OBSTETRIC <14 WK ULTRASOUND
TECHNIQUE: Transabdominal ultrasound was performed for evaluation of the
gestation as well as the maternal uterus and adnexal regions.

[Series 1: us ob less than 14 weeks with ob transvaginal · 14 of 98 slices shown]
[im 4/98]
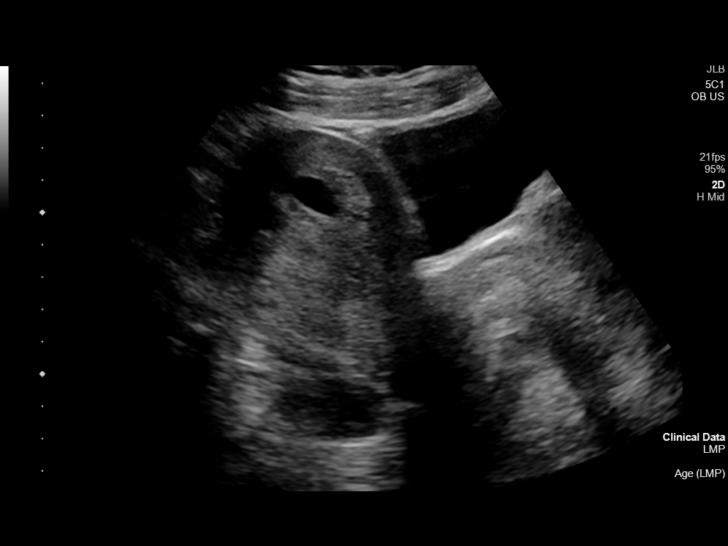
[im 11/98]
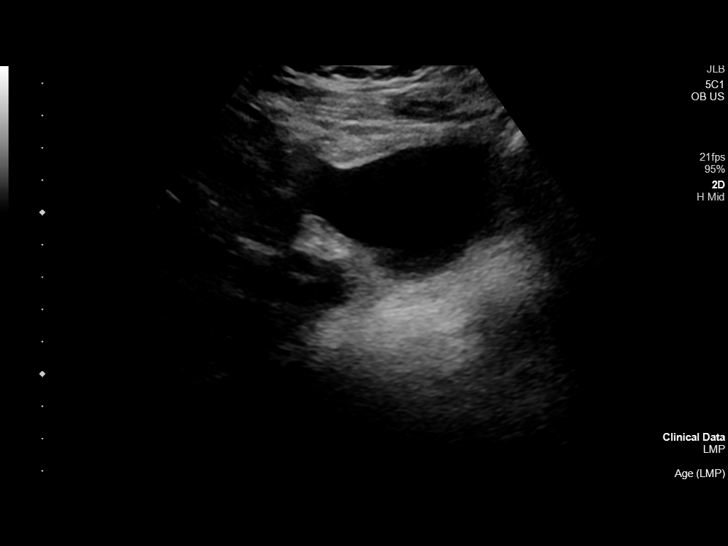
[im 18/98]
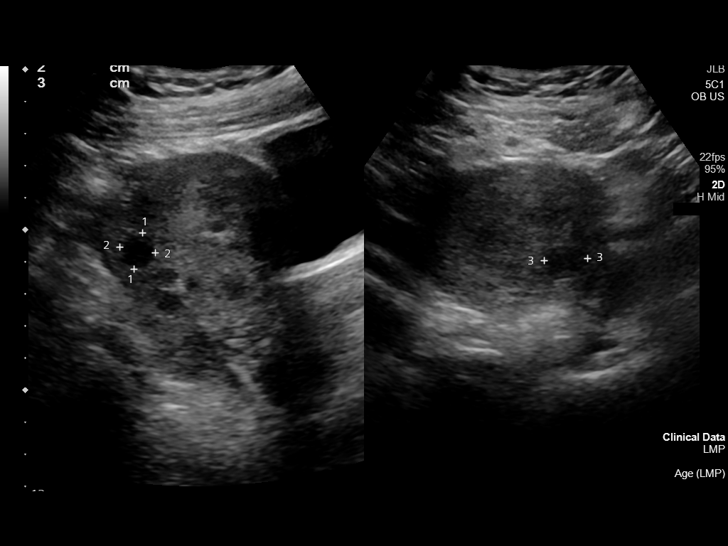
[im 26/98]
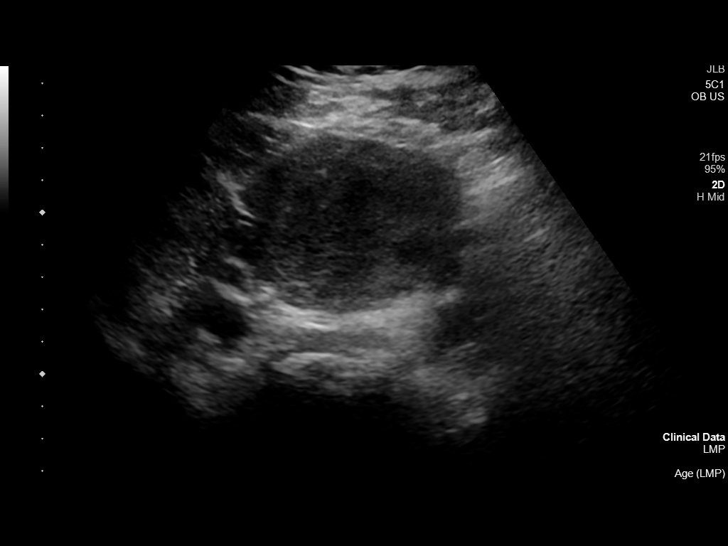
[im 33/98]
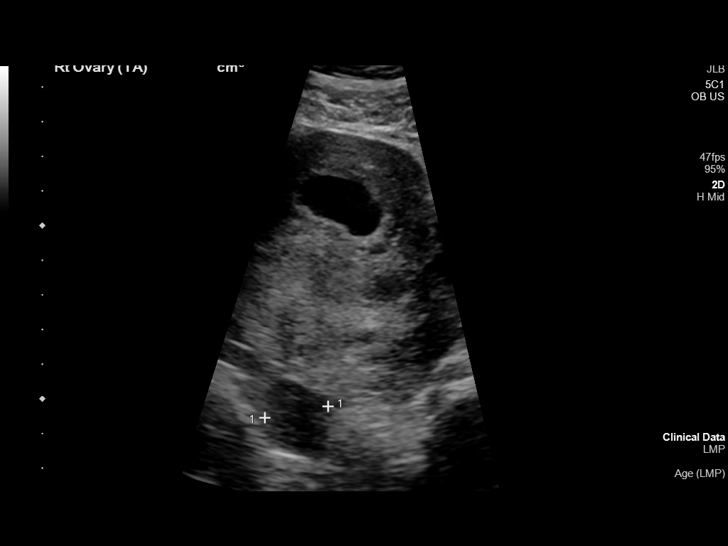
[im 40/98]
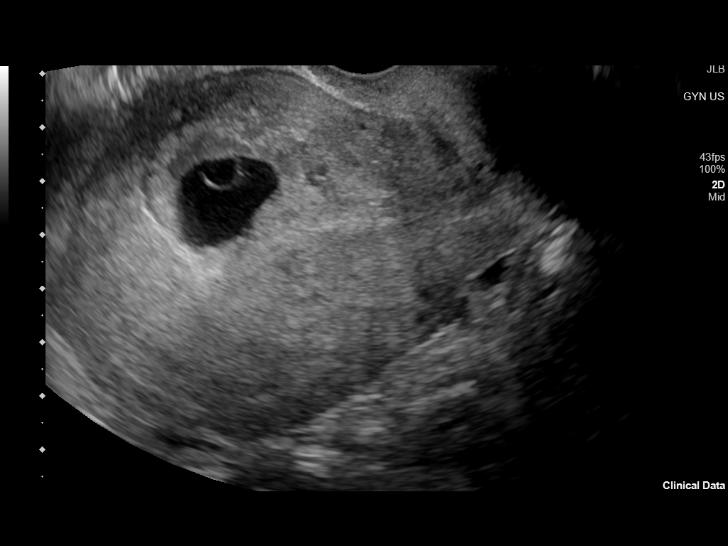
[im 47/98]
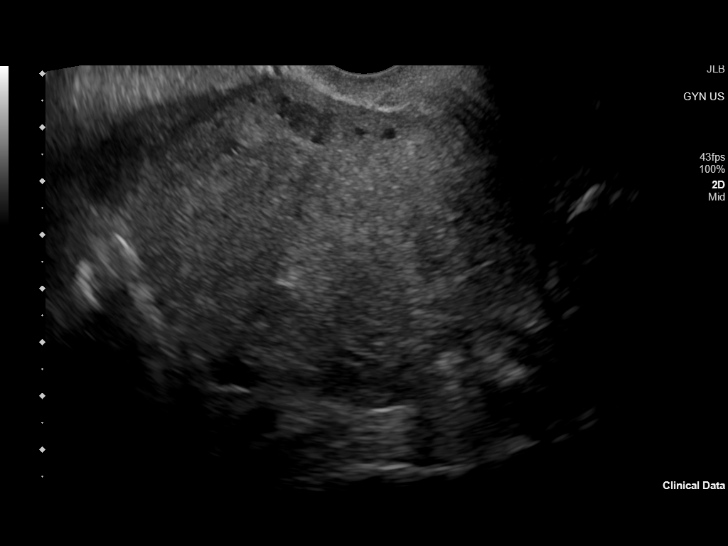
[im 54/98]
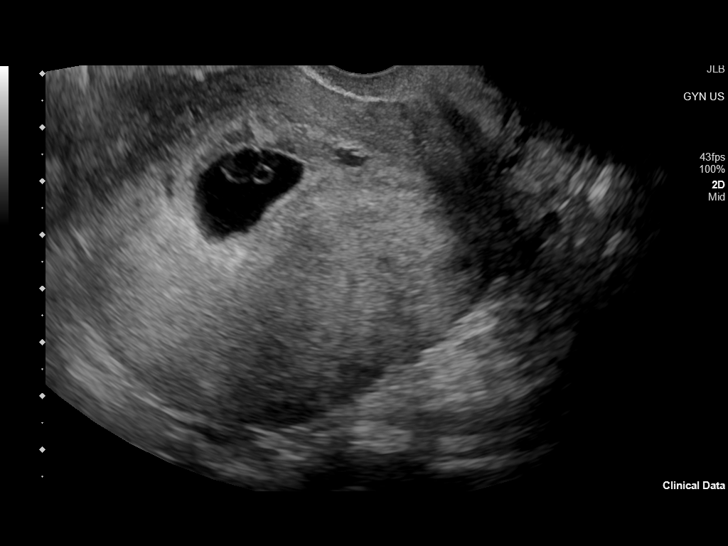
[im 62/98]
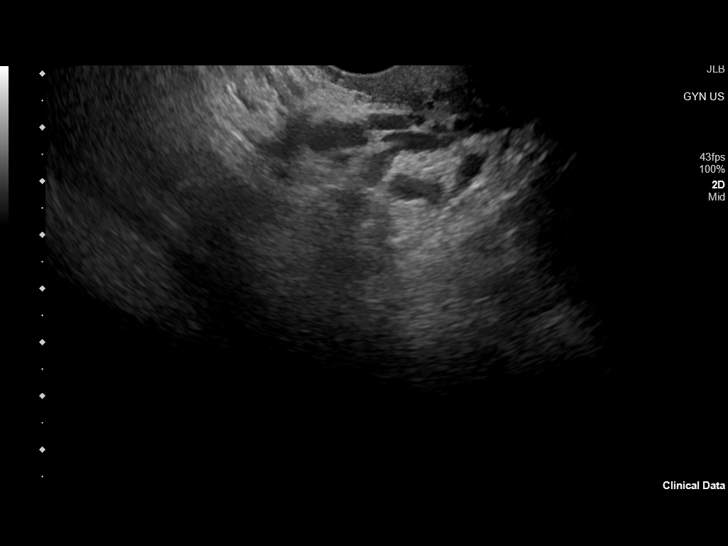
[im 69/98]
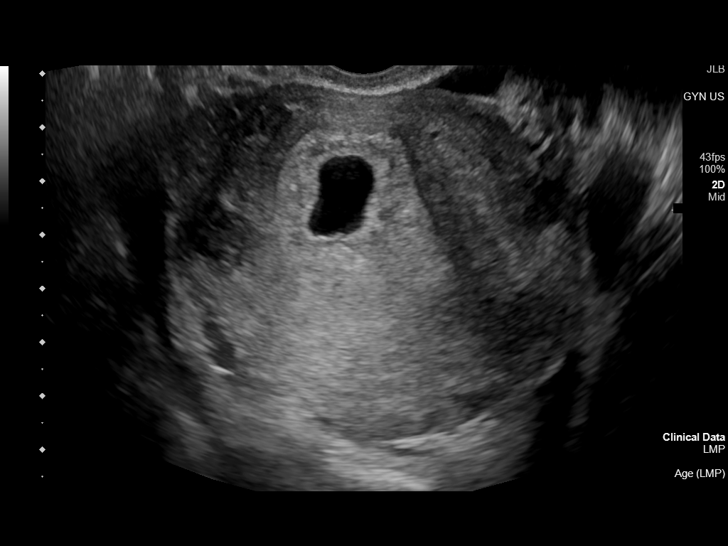
[im 76/98]
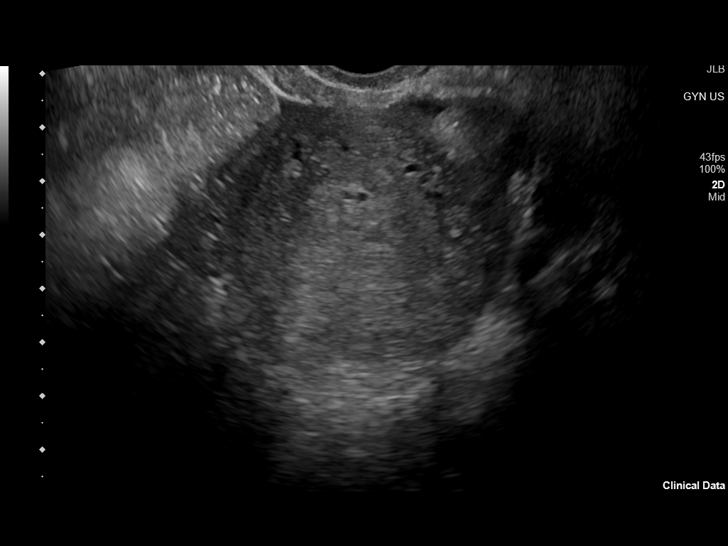
[im 83/98]
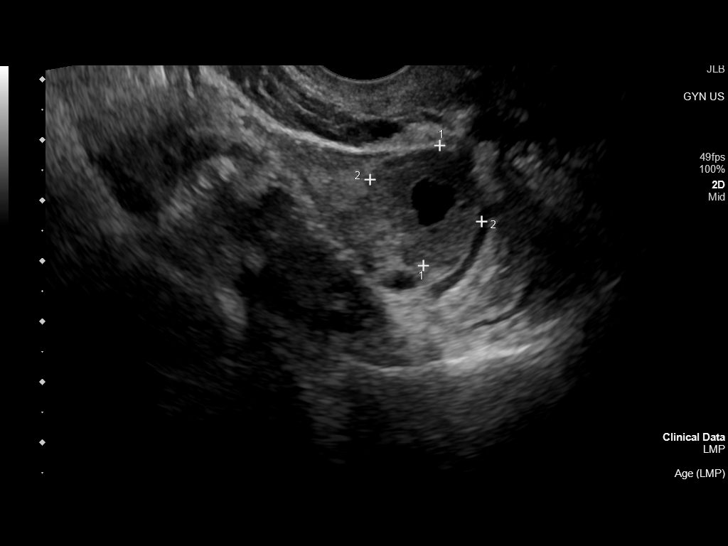
[im 90/98]
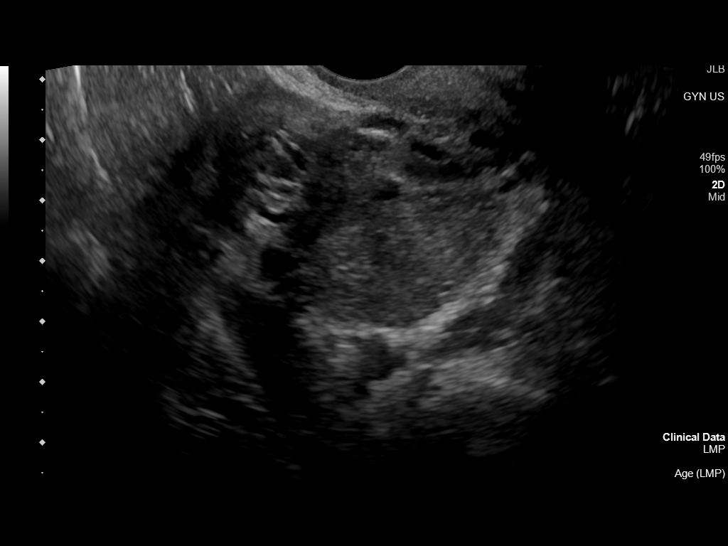
[im 98/98]
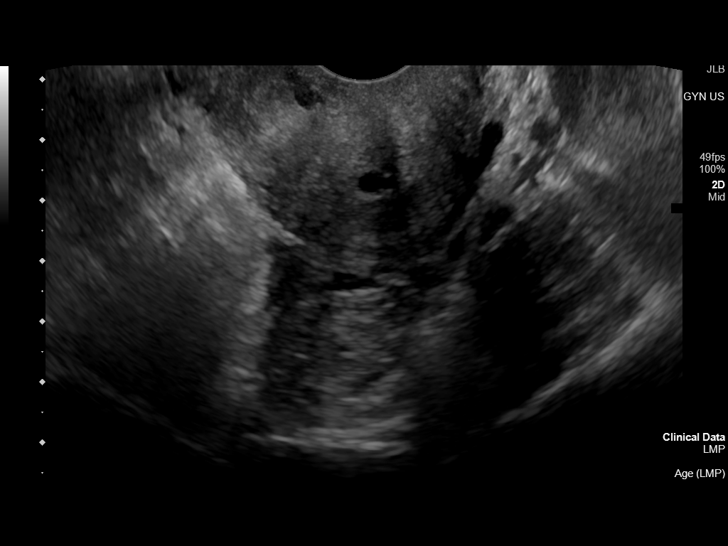

[14 of 28 positions shown; findings below may reference images not displayed]

FINDINGS: Intrauterine gestational sac: Single

Yolk sac:  Visualized.

Embryo:  Visualized.

Cardiac Activity: Not Visualized.

Heart Rate: Not applicable.

CRL:   3.8 mm   6 w 0 d                  US EDC: 02/19/2022

Subchorionic hemorrhage:  Small.

Maternal uterus/adnexae: Corpus luteum of the right ovary. Multiple
uterine fibroids.
IMPRESSION: 1. Single intrauterine gestation appropriately positioned in the
uterine fundus at sonographic gestational age of 6 weeks, 0 days.
Fetal pole is identified without no fetal cardiac activity; early
pregnancy of uncertain viability. Recommend follow-up ultrasound in
7-14 days to assess for continued development and viability.

2.  Small subchorionic hemorrhage.

3.  Uterine fibroids.

## 2023-02-02 ENCOUNTER — Encounter: Payer: Self-pay | Admitting: Obstetrics and Gynecology

## 2023-02-02 ENCOUNTER — Ambulatory Visit (INDEPENDENT_AMBULATORY_CARE_PROVIDER_SITE_OTHER): Payer: BC Managed Care – PPO | Admitting: Obstetrics and Gynecology

## 2023-02-02 VITALS — BP 104/68 | HR 80 | Wt 174.8 lb

## 2023-02-02 DIAGNOSIS — Z130 Encounter for screening for diseases of the blood and blood-forming organs and certain disorders involving the immune mechanism: Secondary | ICD-10-CM

## 2023-02-02 DIAGNOSIS — O09522 Supervision of elderly multigravida, second trimester: Secondary | ICD-10-CM

## 2023-02-02 DIAGNOSIS — O10012 Pre-existing essential hypertension complicating pregnancy, second trimester: Secondary | ICD-10-CM

## 2023-02-02 DIAGNOSIS — O10919 Unspecified pre-existing hypertension complicating pregnancy, unspecified trimester: Secondary | ICD-10-CM

## 2023-02-02 DIAGNOSIS — O3429 Maternal care due to uterine scar from other previous surgery: Secondary | ICD-10-CM

## 2023-02-02 DIAGNOSIS — Z113 Encounter for screening for infections with a predominantly sexual mode of transmission: Secondary | ICD-10-CM

## 2023-02-02 DIAGNOSIS — Z3A22 22 weeks gestation of pregnancy: Secondary | ICD-10-CM

## 2023-02-02 DIAGNOSIS — Z131 Encounter for screening for diabetes mellitus: Secondary | ICD-10-CM

## 2023-02-02 LAB — POCT URINALYSIS DIPSTICK OB
Bilirubin, UA: NEGATIVE
Blood, UA: NEGATIVE
Glucose, UA: NEGATIVE
Ketones, UA: NEGATIVE
Leukocytes, UA: NEGATIVE
Nitrite, UA: NEGATIVE
Spec Grav, UA: 1.025 (ref 1.010–1.025)
Urobilinogen, UA: 0.2 E.U./dL
pH, UA: 6.5 (ref 5.0–8.0)

## 2023-02-02 NOTE — Progress Notes (Signed)
ROB [redacted]w[redacted]d She is doing well. She has good fetal movement. She has been getting dizzy when she changes position everyday for about 4 weeks.

## 2023-02-02 NOTE — Progress Notes (Signed)
ROB: Patient is a 42 y.o. G4P1021 at [redacted]w[redacted]d  Patient doing well overall, does not some occasional dizzy spells. BP on lower end of normal. Discussed increasing hydration, increase intake of salty foods for a few days.  If still no improvement, can decrease BP medication dose for now. Advised that BPs will likely rise again in 3rd trimester. RTC in 4 weeks, for 28 week labs at that time.

## 2023-02-06 ENCOUNTER — Encounter: Payer: Self-pay | Admitting: Obstetrics and Gynecology

## 2023-02-06 ENCOUNTER — Emergency Department
Admission: EM | Admit: 2023-02-06 | Discharge: 2023-02-06 | Disposition: A | Discharge status: Home / Self Care | Payer: BC Managed Care – PPO | Source: Home / Self Care | Attending: Student in an Organized Health Care Education/Training Program | Admitting: Student in an Organized Health Care Education/Training Program

## 2023-02-06 ENCOUNTER — Other Ambulatory Visit: Payer: Self-pay

## 2023-02-06 ENCOUNTER — Emergency Department: Payer: BC Managed Care – PPO

## 2023-02-06 ENCOUNTER — Observation Stay
Admission: RE | Admit: 2023-02-06 | Discharge: 2023-02-06 | Disposition: A | Discharge status: Home / Self Care | Payer: BC Managed Care – PPO | Attending: Licensed Practical Nurse | Admitting: Licensed Practical Nurse

## 2023-02-06 DIAGNOSIS — Z79899 Other long term (current) drug therapy: Secondary | ICD-10-CM | POA: Diagnosis not present

## 2023-02-06 DIAGNOSIS — R42 Dizziness and giddiness: Secondary | ICD-10-CM | POA: Insufficient documentation

## 2023-02-06 DIAGNOSIS — R109 Unspecified abdominal pain: Secondary | ICD-10-CM | POA: Diagnosis not present

## 2023-02-06 DIAGNOSIS — O26892 Other specified pregnancy related conditions, second trimester: Secondary | ICD-10-CM | POA: Insufficient documentation

## 2023-02-06 DIAGNOSIS — R252 Cramp and spasm: Secondary | ICD-10-CM | POA: Diagnosis not present

## 2023-02-06 DIAGNOSIS — R0789 Other chest pain: Secondary | ICD-10-CM | POA: Insufficient documentation

## 2023-02-06 DIAGNOSIS — O10012 Pre-existing essential hypertension complicating pregnancy, second trimester: Secondary | ICD-10-CM | POA: Insufficient documentation

## 2023-02-06 DIAGNOSIS — Z3A23 23 weeks gestation of pregnancy: Secondary | ICD-10-CM | POA: Insufficient documentation

## 2023-02-06 DIAGNOSIS — O26899 Other specified pregnancy related conditions, unspecified trimester: Secondary | ICD-10-CM | POA: Diagnosis present

## 2023-02-06 DIAGNOSIS — Z3A00 Weeks of gestation of pregnancy not specified: Secondary | ICD-10-CM | POA: Insufficient documentation

## 2023-02-06 DIAGNOSIS — J45909 Unspecified asthma, uncomplicated: Secondary | ICD-10-CM | POA: Diagnosis not present

## 2023-02-06 DIAGNOSIS — O99512 Diseases of the respiratory system complicating pregnancy, second trimester: Secondary | ICD-10-CM | POA: Insufficient documentation

## 2023-02-06 LAB — URINALYSIS, COMPLETE (UACMP) WITH MICROSCOPIC
Bilirubin Urine: NEGATIVE
Glucose, UA: NEGATIVE mg/dL
Hgb urine dipstick: NEGATIVE
Ketones, ur: 80 mg/dL — AB
Leukocytes,Ua: NEGATIVE
Nitrite: NEGATIVE
Protein, ur: 30 mg/dL — AB
Specific Gravity, Urine: 1.024 (ref 1.005–1.030)
pH: 6 (ref 5.0–8.0)

## 2023-02-06 LAB — CBC
HCT: 33.7 % — ABNORMAL LOW (ref 36.0–46.0)
Hemoglobin: 11.2 g/dL — ABNORMAL LOW (ref 12.0–15.0)
MCH: 33.3 pg (ref 26.0–34.0)
MCHC: 33.2 g/dL (ref 30.0–36.0)
MCV: 100.3 fL — ABNORMAL HIGH (ref 80.0–100.0)
Platelets: 146 10*3/uL — ABNORMAL LOW (ref 150–400)
RBC: 3.36 MIL/uL — ABNORMAL LOW (ref 3.87–5.11)
RDW: 13.4 % (ref 11.5–15.5)
WBC: 7.3 10*3/uL (ref 4.0–10.5)
nRBC: 0 % (ref 0.0–0.2)

## 2023-02-06 LAB — CHLAMYDIA/NGC RT PCR (ARMC ONLY)
Chlamydia Tr: NOT DETECTED
N gonorrhoeae: NOT DETECTED

## 2023-02-06 LAB — WET PREP, GENITAL
Sperm: NONE SEEN
Trich, Wet Prep: NONE SEEN
WBC, Wet Prep HPF POC: >=10 — AB (ref <10)
Yeast Wet Prep HPF POC: NONE SEEN

## 2023-02-06 LAB — BASIC METABOLIC PANEL
Anion gap: 7 (ref 5–15)
BUN: 13 mg/dL (ref 6–20)
CO2: 21 mmol/L — ABNORMAL LOW (ref 22–32)
Calcium: 9 mg/dL (ref 8.9–10.3)
Chloride: 107 mmol/L (ref 98–111)
Creatinine, Ser: 0.85 mg/dL (ref 0.44–1.00)
GFR, Estimated: >60 mL/min (ref >60)
Glucose, Bld: 96 mg/dL (ref 70–99)
Potassium: 3.8 mmol/L (ref 3.5–5.1)
Sodium: 135 mmol/L (ref 135–145)

## 2023-02-06 LAB — TROPONIN I (HIGH SENSITIVITY): Troponin I (High Sensitivity): 5 ng/L (ref <18)

## 2023-02-06 MED ORDER — METRONIDAZOLE 500 MG PO TABS: 500.0000 mg | ORAL_TABLET | Freq: Two times a day (BID) | ORAL | 0 refills | Status: DC

## 2023-02-06 MED ORDER — NIFEDIPINE ER OSMOTIC RELEASE 60 MG PO TB24: 60.0000 mg | ORAL_TABLET | Freq: Every day | ORAL | 11 refills | Status: DC

## 2023-02-06 MED ORDER — SODIUM CHLORIDE 0.9 % IV BOLUS
1000.0000 mL | Freq: Once | INTRAVENOUS | Status: AC
  Administered 2023-02-06: 1000 mL via INTRAVENOUS

## 2023-02-06 NOTE — Discharge Instructions (Signed)
Follow up on Wednesday 02/09/23 for a blood pressure check.  Call your provider if you have questions or concerns.  If you have urgent concerns go to the nearest emergency department for evaluation.

## 2023-02-06 NOTE — ED Notes (Signed)
Report given to L&D. NS infusing upon transfer. Patient's clothing, purse and cell phone are with the patient on transfer.

## 2023-02-06 NOTE — Discharge Summary (Signed)
Physician Final Progress Note  Patient ID: Gabriela Thomas MRN: [id number] DOB/AGE: 26-Sep-1981 42 y.o.  Admit date: 02/06/2023 Admitting provider: Harlin Heys, MD Discharge date: 02/06/2023   Admission Diagnoses:  1) intrauterine pregnancy at [redacted]w[redacted]d 2) cramping at 23wks   Discharge Diagnoses:  Principal Problem:   Cramping affecting pregnancy, antepartum CHTN  BV   History of Present Illness: The patient is a 42y.o. female G4P1021 at 260w3dho presents for evaluation for dizziness and cramping. She was driving to work this afternoon, she felt an intense lower abdominal cramp and then suddenly got hot and felt like she was going to pass out, she pulled her car over and called 911. She did not pass out. [facility] she was evaluated in the ED, a few PVC's were noted on the EKG otherwise she was cleared by the ED. The ED provider was concerned for braxton hicks contractions so she was sent to L and D. The pt reports she has felt lower abdominal cramping that is so intense that her belly gets as hard as the wall. The cramping occurs a few times a day. This started a day or so ago.  She does feel dizzy when this pain occurs. She last had a BM yesterday, she does struggle with constipation and has been using stool softeners and suppositories regularly.  She denies any abnormal discharge, denies urinary symptoms. She does have a hx of CHTN and is on Procardia 9020maily. She reported at her most recent ROB that she has been having frequent episodes of dizziness.   Past Medical History:  Diagnosis Date   Anxiety    Asthma    AS A CHILD -NO INHALERS   Fibroid    GERD (gastroesophageal reflux disease)    OCC   Headache    Hypertension    Macromastia    Miscarriage    PONV (postoperative nausea and vomiting)     Past Surgical History:  Procedure Laterality Date   BREAST REDUCTION SURGERY Bilateral 07/14/2020   Procedure: MAMMARY REDUCTION  (BREAST);  Surgeon:  PacCindra PresumeD;  [facility]  Laterality: Bilateral;   LAPAROTOMY N/A 12/12/2018   Procedure: EXPLORATORY LAPAROTOMY, evacuation of hemoperitoneum, aspiration of left adnexal cyst;  Surgeon: CheRubie MaidD;  Location: [facility];  Service: Gynecology;  Laterality: N/A;   MYOMECTOMY N/A 12/11/2018   Procedure: ABDOMINAL MYOMECTOMY;  Surgeon: CheRubie MaidD;  Location: [facility];  Service: Gynecology;  Laterality: N/A;    No current facility-administered medications on file prior to encounter.   Current Outpatient Medications on File Prior to Encounter  Medication Sig Dispense Refill   aspirin 81 MG chewable tablet Chew by mouth daily.     folic acid (FOLVITE) 800Q000111QG tablet Take 800 mcg by mouth daily.     NIFEdipine (PROCARDIA XL/NIFEDICAL-XL) 90 MG 24 hr tablet Take 1 tablet (90 mg total) by mouth daily. 90 tablet 3   Prenatal Vit-Fe Fumarate-FA (MULTIVITAMIN-PRENATAL) 27-0.8 MG TABS tablet Take 1 tablet by mouth daily at 12 noon.     Doxylamine-Pyridoxine ER (BONJESTA) 20-20 MG TBCR Take 20 mg by mouth 2 (two) times daily. 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   Smoking status: Never   Smokeless tobacco: Never  Vaping Use   Vaping Use: Never used  Substance and Sexual Activity   Alcohol use: Not Currently    Alcohol/week: 7.0 standard drinks of alcohol    Types: 7 Glasses of wine per week   Drug use: No   Sexual activity: Yes    Partners: Male    Birth control/protection: None  Other Topics Concern   Not on file  Social History Narrative   Not on file   Social Determinants of Health   Financial Resource Strain: Low Risk   (10/25/2022)   Overall Financial Resource Strain (CARDIA)    Difficulty of Paying Living Expenses: Not hard at all  Food Insecurity: No Food Insecurity (10/25/2022)   Hunger Vital Sign    Worried About Running Out of Food in the Last Year: Never true    Ran Out of Food in the Last Year: Never true  Transportation Needs: No Transportation Needs (10/25/2022)   PRAPARE - Hydrologist (Medical): No    Lack of Transportation (Non-Medical): No  Physical Activity: Insufficiently Active (10/25/2022)   Exercise Vital Sign    Days of Exercise per Week: 3 days    Minutes of Exercise per Session: 30 min  Stress: No Stress Concern Present (10/25/2022)   [place]    Feeling of Stress : Not at all  Social Connections: Moderately Isolated (10/25/2022)   Social Connection and Isolation Panel [NHANES]    Frequency of Communication with Friends and Family: More than three times a week    Frequency of Social Gatherings with Friends and Family: Not on file    Attends Religious Services: Never    Marine scientist or Organizations: No    Attends Archivist Meetings: Never    Marital Status: Living with partner  Intimate Partner Violence: Not At Risk (10/25/2022)   Humiliation, Afraid, Rape, and Kick questionnaire    Fear of Current or Ex-Partner: No    Emotionally Abused: No    Physically Abused: No    Sexually Abused: No    Family History  Problem Relation Age of Onset   Hypertension Mother    Diabetes Mother    Congestive Heart Failure Mother    Obesity Mother    Alcohol abuse Father    Hypertension Maternal Grandfather    Hypertension Maternal Aunt    Hypertension Maternal Uncle      Review of Systems  Constitutional: Negative.   Eyes: Negative.   Respiratory: Negative.    Cardiovascular: Negative.   Gastrointestinal:  Positive for abdominal pain and constipation.        Bloating recently   Genitourinary: Negative.   Musculoskeletal: Negative.   Neurological: Negative.   Psychiatric/Behavioral: Negative.       Physical Exam: LMP 08/26/2022 (Exact Date)   Physical Exam Constitutional:      Appearance: Normal appearance.  Genitourinary:     Vulva normal.     Genitourinary Comments: SSE: cervix visually closed, moderate amount of homogenous white discharge present   Cardiovascular:     Rate and Rhythm: Normal rate and regular rhythm.     Heart sounds: Normal heart sounds.  Pulmonary:     Effort: Pulmonary effort is normal.  Abdominal:     Tenderness: There is no abdominal  tenderness.     Comments: Gravid   Musculoskeletal:     Cervical back: Normal range of motion.     Right lower leg: No edema.     Left lower leg: No edema.  Neurological:     General: No focal deficit present.  Skin:    General: Skin is warm.  Psychiatric:        Mood and Affect: Mood normal.   EMF: baseline 135, moderate variability, pos accel (10x10), neg decel  TOCO: irritability present   Consults:  evaluated in the ED, see note   Significant Findings/ Diagnostic Studies: negative chest x ray, EKG shows occasional PVC. Wet prep shows clue cells and Wbc's.  UA with ketones, protein and rare bacteria    Procedures: RNST   Hospital Course: The patient was admitted to Labor and Delivery Triage for observation. She was evaluated and given a liter of fluid in the ED. She was cleared by a cardiac standpoint. While in triage a RNST was done. Irritability was present, her cervix is visually closed. Reviewed cramping could be normal pregnancy discomforts related to braxton-hicks contractions or constipation.  Swabs and urine colleted for rule out infection. Recommend support belt and may consider Wild Yam Root tincture to quiet the uterus. Recommend fiber supplement and smooth move tea as needed for constipation. Pt's blood pressure noted to be on the low side. Reviewed symptoms  and blood pressure readings with Dr Marcelline Mates. Will decrease Procardia to 76m daily. Pt to return to clinic on Wednesday for blood pressure check. Wet prep shows clue cells and WBC's will treat for BV, reviewed with pt.  gc/ct pending, will contact pt with any abnormal results.   Discharge Condition: stable  Disposition: Discharge disposition: 01-Home or Self Care       Diet: Regular diet  Discharge Activity: Activity as tolerated   Allergies as of 02/06/2023   No Known Allergies      Medication List     STOP taking these medications    Bonjesta 20-20 MG Tbcr Generic drug: Doxylamine-Pyridoxine ER       TAKE these medications    aspirin 81 MG chewable tablet Chew by mouth daily.   folic acid [id number] tablet Commonly known as: FOLVITE Take 800 mcg by mouth daily.   metroNIDAZOLE 500 MG tablet Commonly known as: FLAGYL Take 1 tablet (500 mg total) by mouth 2 (two) times daily.   multivitamin-prenatal 27-0.8 MG Tabs tablet Take 1 tablet by mouth daily at 12 noon.   NIFEdipine 60 MG 24 hr tablet Commonly known as: Procardia XL Take 1 tablet (60 mg total) by mouth daily. What changed:  medication strength how much to take        Follow-up Information     [facility]. Schedule an appointment as soon as possible for a visit.   Why: Make an appointment for Wednesday 02/09/23 to get blood pressure checked. Contact information: [phone]               Total time spent taking care of this patient: 15 minutes  Signed: LClearmont CNM  02/06/2023, 6:18 PM

## 2023-02-06 NOTE — ED Notes (Addendum)
NS is still infusing. Bag was placed on an IV pole and elevated. NS is infusing at a faster rate. IV site is patent. Patient will transfer to Labor and Delivery for monitoring when Fluids are finished.

## 2023-02-06 NOTE — ED Triage Notes (Signed)
Pt to ED at 6 months pregnant, EDD 06/02/23 G2P1. Pt has had on and off dizziness for months. Dizziness got bad yesterday and was bad today while driving. Pt states began "seeing white" and felt SOB today. States gets dizziness when has BM also. Called upstairs already and they refuse to see pt at this time. States gets lots of braxton hicks ctx and this also makes her feel dizzy sometimes.

## 2023-02-06 NOTE — OB Triage Note (Signed)
Patient has been experiencing tightening of her mid abdomen that began at the end of January.  She reports that symptoms have been more intense the last 2 days.  On her way to work she felt hot all over and her vision became white and cloudy and she thought she was going to pass out.  Patient called 911 and was assessed by EMS.  Patient's boyfriend transported patient to ED for evaluation.  Patient reports that at home she would get abdominal tightening and would have to go to restroom.  She states that after having bowel movement that she would feel better.

## 2023-02-06 NOTE — ED Provider Notes (Signed)
[facility] Provider Note    Event Date/Time   First MD Initiated Contact with Patient 02/06/23 1405     (approximate)   History   Dizziness   HPI  Gabriela Thomas is a 42 y.o. female with G4P1021 6 months pregnant presents to the ER for episode of on and off dizziness for several weeks.  States that she had an episode today where she generally starts feeling some cramping sensation in her abdomen she starts feeling tightness and then starts having some chest discomfort and shortness of breath.  She was driving to work today and felt she was in a pass out so she pulled over.  States that she was hyperventilating called EMS who brought her to the ER.  She currently feels well.  States that this is similar to previous episodes.  Denies any numbness or tingling.  Denies any chest pain or pleuritic pain.  She is on nifedipine.  She denies any abdominal pain.      Physical Exam   Triage Vital Signs: ED Triage Vitals  Enc Vitals Group     BP 02/06/23 1345 108/75     Pulse Rate 02/06/23 1345 84     Resp 02/06/23 1345 16     Temp 02/06/23 1345 98 F (36.7 C)     Temp src --      SpO2 02/06/23 1345 100 %     Weight 02/06/23 1345 171 lb (77.6 kg)     Height 02/06/23 1345 5' 4"$  (1.626 m)     Head Circumference --      Peak Flow --      Pain Score 02/06/23 1349 0     Pain Loc --      Pain Edu? --      Excl. in [place]? --     Most recent vital signs: Vitals:   02/06/23 1345 02/06/23 1411  BP: 108/75 108/73  Pulse: 84 74  Resp: 16 18  Temp: 98 F (36.7 C)   SpO2: 100% 100%     Constitutional: Alert  Eyes: Conjunctivae are normal.  Head: Atraumatic. Nose: No congestion/rhinnorhea. Mouth/Throat: Mucous membranes are moist.   Neck: Painless ROM.  Cardiovascular:   Good peripheral circulation. Respiratory: Normal respiratory effort.  No retractions.  Gastrointestinal: Soft and nontender.  Musculoskeletal:  no deformity Neurologic:  MAE  spontaneously. No gross focal neurologic deficits are appreciated.  Skin:  Skin is warm, dry and intact. No rash noted. Psychiatric: Mood and affect are normal. Speech and behavior are normal.    ED Results / Procedures / Treatments   Labs (all labs ordered are listed, but only abnormal results are displayed) Labs Reviewed  BASIC METABOLIC PANEL - Abnormal; Notable for the following components:      Result Value   CO2 21 (*)    All other components within normal limits  CBC - Abnormal; Notable for the following components:   RBC 3.36 (*)    Hemoglobin 11.2 (*)    HCT 33.7 (*)    MCV 100.3 (*)    Platelets 146 (*)    All other components within normal limits  TROPONIN I (HIGH SENSITIVITY)  TROPONIN I (HIGH SENSITIVITY)     EKG  ED ECG REPORT I, Merlyn Lot, the attending physician, personally viewed and interpreted this ECG.   Date: 02/06/2023  EKG Time: 13:50  Rate: 80  Rhythm: sinus  Axis normal  Intervals: normal  ST&T Change: no stemi, no depressions  RADIOLOGY Please see ED Course for my review and interpretation.  I personally reviewed all radiographic images ordered to evaluate for the above acute complaints and reviewed radiology reports and findings.  These findings were personally discussed with the patient.  Please see medical record for radiology report.    PROCEDURES:  Critical Care performed: No  Procedures   MEDICATIONS ORDERED IN ED: Medications  sodium chloride 0.9 % bolus 1,000 mL (1,000 mLs Intravenous New Bag/Given 02/06/23 1511)     IMPRESSION / MDM / ASSESSMENT AND PLAN / ED COURSE  I reviewed the triage vital signs and the nursing notes.                              Differential diagnosis includes, but is not limited to, [employer], labor, anemia, cardiomyopathy, ACS, PE, asthma, dysrhythmia  Patient presenting to the ER for evaluation of symptoms as described above.  Based on symptoms, risk factors and considered  above differential, this presenting complaint could reflect a potentially life-threatening illness therefore the patient will be placed on continuous pulse oximetry and telemetry for monitoring.  Laboratory evaluation will be sent to evaluate for the above complaints.  Patient clinically very well-appearing currently she is afebrile hemodynamically stable normotensive no hypoxia no shortness of breath no wheezing on exam.  Given her history have very low suspicion for PE as these seem to be episodic in nature and she is completely asymptomatic at this time with out any other signs or symptoms of DVT PE.  Her EKG does show an occasional PVC as did her EMS report.  No sign of preexcitation syndrome.  Her troponin is negative.  This not consistent with ACS.  Her chest x-ray on my review and interpretation does not show any evidence of cardiomegaly.  No effusion.  She is normotensive this not consistent with preeclampsia.  Her abdominal exam is soft and benign.  No sign of infectious process.  Patient was given fluids.  Her neuroexam is reassuring.  Do not feel that CT imaging clinically indicated.  Discussed case with [facility] who will observe for NST.  Patient is agreeable to plan.       FINAL CLINICAL IMPRESSION(S) / ED DIAGNOSES   Final diagnoses:  Dizziness     Rx / DC Orders   ED Discharge Orders     None        Note:  This document was prepared using Dragon voice recognition software and may include unintentional dictation errors.    Merlyn Lot, MD 02/06/23 [phone]

## 2023-02-07 ENCOUNTER — Encounter: Payer: Self-pay | Admitting: Obstetrics and Gynecology

## 2023-02-11 ENCOUNTER — Ambulatory Visit (INDEPENDENT_AMBULATORY_CARE_PROVIDER_SITE_OTHER): Payer: BC Managed Care – PPO

## 2023-02-11 VITALS — BP 111/74 | HR 91 | Ht 64.0 in | Wt 173.9 lb

## 2023-02-11 DIAGNOSIS — O10919 Unspecified pre-existing hypertension complicating pregnancy, unspecified trimester: Secondary | ICD-10-CM

## 2023-02-11 DIAGNOSIS — O10012 Pre-existing essential hypertension complicating pregnancy, second trimester: Secondary | ICD-10-CM

## 2023-02-11 DIAGNOSIS — Z3A23 23 weeks gestation of pregnancy: Secondary | ICD-10-CM

## 2023-02-11 NOTE — Progress Notes (Signed)
    NURSE VISIT NOTE  Subjective:    Patient ID: Gabriela Thomas, female    DOB: 30-Dec-1980, 42 y.o.   MRN: [id number]  HPI  Patient is a 42 y.o. G78P1021 female who presents for BP check per order from Roberto Scales, [place].   Patient reports compliance with prescribed BP medications: yes Procardia 60 mgdaily Last dose of BP medication:  02/10/23 at 8:00 pm . She states she is still feeling dizzy throughout the day; discussed moving slowly from different positions, staying hydrated and to let us know if anything changes or gets worse.   BP Readings from Last 3 Encounters:  02/11/23 111/74  02/06/23 122/67  02/06/23 115/69   Pulse Readings from Last 3 Encounters:  02/11/23 91  02/06/23 (!) 58  02/06/23 68    Objective:    BP 111/74   Pulse 91   Ht 5' 4"$  (1.626 m)   Wt 173 lb 14.4 oz (78.9 kg)   LMP 08/26/2022 (Exact Date)   BMI 29.85 kg/m   Assessment:   1. Chronic hypertension in pregnancy      Plan:   Per Dr. Roberto Scales, CNM:  Continue current treatment regimen. Continue to monitor blood pressure at home. Report any reading >140/90 or with any associated symptoms. Return to clinic as scheduled.  Patient verbalized understanding of instructions.   Marykay Lex, CMA

## 2023-02-16 ENCOUNTER — Telehealth: Payer: Self-pay

## 2023-02-16 DIAGNOSIS — O10919 Unspecified pre-existing hypertension complicating pregnancy, unspecified trimester: Secondary | ICD-10-CM

## 2023-02-16 DIAGNOSIS — R000 Tachycardia, unspecified: Secondary | ICD-10-CM

## 2023-02-16 DIAGNOSIS — R42 Dizziness and giddiness: Secondary | ICD-10-CM

## 2023-02-16 NOTE — Telephone Encounter (Signed)
Spoke with Rod Can, CNM on call. Advised of symptoms and discussed need for cardiology referral. Will place referral and notify patient to report to ED with any symptoms prior to referral appointment.

## 2023-02-16 NOTE — Telephone Encounter (Signed)
Advised patient of need for cardiology referral and to report to ED with any symptoms prior to referral appointment.

## 2023-02-16 NOTE — Telephone Encounter (Signed)
Patient reports since 2/10 or 2/11, she has been having braxton hicks contractions daily from early am thru mid day. She states her heart rate elevates when this happens. She does have a bp monitor at home and her blood pressure is fine ~117/80 but her heart rate has been 124 or above. She reports she has been out of work for the past two weeks because of this. She breaks out into sweat and has to use a fan to cool off and then sleeps for several hours afterward. Inquired about fluid intake and bowel movements. She reports she stays hydrated with water and gatorade. She uses suppositories prn and drinks smoothe move tea for constipation. Advised will discuss with on call provider and contact her back.

## 2023-02-17 ENCOUNTER — Encounter: Payer: Self-pay | Admitting: Obstetrics and Gynecology

## 2023-02-17 NOTE — Telephone Encounter (Signed)
Advised by Dr. Marcelline Mates to have patient reduce procardia to half, by breaking tablet or taking it every other day. Also to see if discomfort is related to eating, it could be heartburn. Called patient she had already taken her Procardia, she will skip dose tomorrow. She said she thought it might be related to heartburn, but unsure. I advised her to take some heartburn medication listed on her safe medication list to see if she had any relief. She verbalized understanding. She will see Dr. Marcelline Mates on 02/28. She will also call back if she has any new concerns.

## 2023-02-21 ENCOUNTER — Telehealth: Payer: Self-pay

## 2023-02-21 NOTE — Telephone Encounter (Signed)
Patient is calling in about FMLA papers that are needing to be filled out due to issues regarding BP and work, non pregnancy related. She states this is different than her postpartum FMLA. She states the company will fax a blank copy to the office. Please advise

## 2023-02-23 ENCOUNTER — Other Ambulatory Visit: Payer: BC Managed Care – PPO

## 2023-02-23 ENCOUNTER — Ambulatory Visit (INDEPENDENT_AMBULATORY_CARE_PROVIDER_SITE_OTHER): Payer: BC Managed Care – PPO | Admitting: Obstetrics and Gynecology

## 2023-02-23 VITALS — BP 117/88 | HR 88 | Wt 175.0 lb

## 2023-02-23 DIAGNOSIS — Z23 Encounter for immunization: Secondary | ICD-10-CM

## 2023-02-23 DIAGNOSIS — O09522 Supervision of elderly multigravida, second trimester: Secondary | ICD-10-CM

## 2023-02-23 DIAGNOSIS — Z3A26 26 weeks gestation of pregnancy: Secondary | ICD-10-CM

## 2023-02-23 DIAGNOSIS — O10919 Unspecified pre-existing hypertension complicating pregnancy, unspecified trimester: Secondary | ICD-10-CM

## 2023-02-23 DIAGNOSIS — O10012 Pre-existing essential hypertension complicating pregnancy, second trimester: Secondary | ICD-10-CM

## 2023-02-23 DIAGNOSIS — O09521 Supervision of elderly multigravida, first trimester: Secondary | ICD-10-CM

## 2023-02-23 DIAGNOSIS — O26899 Other specified pregnancy related conditions, unspecified trimester: Secondary | ICD-10-CM

## 2023-02-23 DIAGNOSIS — Z6791 Unspecified blood type, Rh negative: Secondary | ICD-10-CM

## 2023-02-23 DIAGNOSIS — Z3A25 25 weeks gestation of pregnancy: Secondary | ICD-10-CM

## 2023-02-23 DIAGNOSIS — O0990 Supervision of high risk pregnancy, unspecified, unspecified trimester: Secondary | ICD-10-CM

## 2023-02-23 DIAGNOSIS — O3429 Maternal care due to uterine scar from other previous surgery: Secondary | ICD-10-CM

## 2023-02-23 LAB — POCT URINALYSIS DIPSTICK OB
Bilirubin, UA: NEGATIVE
Blood, UA: NEGATIVE
Glucose, UA: NEGATIVE
Ketones, UA: NEGATIVE
Leukocytes, UA: NEGATIVE
Nitrite, UA: NEGATIVE
POC,PROTEIN,UA: NEGATIVE
Spec Grav, UA: 1.02 (ref 1.010–1.025)
Urobilinogen, UA: 0.2 E.U./dL
pH, UA: 7.5 (ref 5.0–8.0)

## 2023-02-23 NOTE — Progress Notes (Signed)
Patient is a 42 y.o. [id number] at 25w0dwho presents for routine OB visit.  Notes episodes over the past weeks with hot flushes, dizziness, tightness in her abdomen, and seeing "white".  Has decreased Procardia dosing twice  (down from 90 mg now at 30 mg).  Notes moving helps. Episodes last for ~ 20-30 minutes. Has been seen in triage several times for this but receives different answer. I discussed possibility of vagal response to strong [employer]  Notes that a co-worker of hers also experienced episodes like these during her pregnancies as well. Has concerns about her baby coming early as her last pregnancy she delivered 3 weeks early. Attempted to alleviate concerns, however did not that an early delivery/preterm delivery was not impossible based her risk factors in current pregnancy (cHTN, AMA, h/o myomectomy).    Patient given Tdap today. Was to complete glucola today but was not given drink in time. Will reschedule for next visit, and rhogam. RTC in 3-4 weeks.

## 2023-02-24 ENCOUNTER — Encounter: Payer: Self-pay | Admitting: Obstetrics and Gynecology

## 2023-02-24 DIAGNOSIS — O10919 Unspecified pre-existing hypertension complicating pregnancy, unspecified trimester: Secondary | ICD-10-CM | POA: Insufficient documentation

## 2023-02-25 ENCOUNTER — Telehealth: Payer: Self-pay

## 2023-02-25 NOTE — Telephone Encounter (Signed)
Called Gabriela Thomas and spoke to her regarding her FMLA forms, she was stating that she was out from 02/05/23 and went back on 02/20/23. I dont see a doctors note that we took her out of work for those dates. Said she was going to talk to Dr. Marcelline Mates at her next visit. She kept telling me just dont fill them out then and she would speak with Dr. Marcelline Mates.   Dr. Marcelline Mates is this something you approve of,this is for intermittent leave from 02/05/23 and went back to work on 02/20/23.

## 2023-02-27 ENCOUNTER — Observation Stay
Admission: RE | Admit: 2023-02-27 | Discharge: 2023-02-27 | Disposition: A | Discharge status: Home / Self Care | Payer: BC Managed Care – PPO | Attending: Licensed Practical Nurse | Admitting: Licensed Practical Nurse

## 2023-02-27 ENCOUNTER — Encounter: Payer: Self-pay | Admitting: Intensive Care

## 2023-02-27 ENCOUNTER — Emergency Department
Admission: EM | Admit: 2023-02-27 | Discharge: 2023-02-27 | Disposition: A | Discharge status: Home / Self Care | Payer: BC Managed Care – PPO | Source: Home / Self Care | Attending: Emergency Medicine | Admitting: Emergency Medicine

## 2023-02-27 ENCOUNTER — Other Ambulatory Visit: Payer: Self-pay

## 2023-02-27 ENCOUNTER — Encounter: Payer: Self-pay | Admitting: Obstetrics and Gynecology

## 2023-02-27 DIAGNOSIS — R55 Syncope and collapse: Secondary | ICD-10-CM | POA: Insufficient documentation

## 2023-02-27 DIAGNOSIS — R42 Dizziness and giddiness: Secondary | ICD-10-CM | POA: Diagnosis not present

## 2023-02-27 DIAGNOSIS — O99891 Other specified diseases and conditions complicating pregnancy: Principal | ICD-10-CM | POA: Insufficient documentation

## 2023-02-27 DIAGNOSIS — Z7982 Long term (current) use of aspirin: Secondary | ICD-10-CM | POA: Diagnosis not present

## 2023-02-27 DIAGNOSIS — R61 Generalized hyperhidrosis: Secondary | ICD-10-CM | POA: Diagnosis not present

## 2023-02-27 DIAGNOSIS — J45909 Unspecified asthma, uncomplicated: Secondary | ICD-10-CM | POA: Diagnosis not present

## 2023-02-27 DIAGNOSIS — O26892 Other specified pregnancy related conditions, second trimester: Secondary | ICD-10-CM | POA: Insufficient documentation

## 2023-02-27 DIAGNOSIS — Z3A26 26 weeks gestation of pregnancy: Secondary | ICD-10-CM | POA: Insufficient documentation

## 2023-02-27 DIAGNOSIS — O99512 Diseases of the respiratory system complicating pregnancy, second trimester: Secondary | ICD-10-CM | POA: Diagnosis not present

## 2023-02-27 DIAGNOSIS — O10012 Pre-existing essential hypertension complicating pregnancy, second trimester: Secondary | ICD-10-CM | POA: Diagnosis not present

## 2023-02-27 DIAGNOSIS — R0789 Other chest pain: Secondary | ICD-10-CM | POA: Diagnosis not present

## 2023-02-27 DIAGNOSIS — Z79899 Other long term (current) drug therapy: Secondary | ICD-10-CM | POA: Insufficient documentation

## 2023-02-27 DIAGNOSIS — O09522 Supervision of elderly multigravida, second trimester: Secondary | ICD-10-CM | POA: Insufficient documentation

## 2023-02-27 LAB — HEPATIC FUNCTION PANEL
ALT: 24 U/L (ref 0–44)
AST: 28 U/L (ref 15–41)
Albumin: 3.2 g/dL — ABNORMAL LOW (ref 3.5–5.0)
Alkaline Phosphatase: 48 U/L (ref 38–126)
Bilirubin, Direct: <0.1 mg/dL (ref 0.0–0.2)
Total Bilirubin: 0.8 mg/dL (ref 0.3–1.2)
Total Protein: 6.7 g/dL (ref 6.5–8.1)

## 2023-02-27 LAB — CBC WITH DIFFERENTIAL/PLATELET
Abs Immature Granulocytes: 0.03 10*3/uL (ref 0.00–0.07)
Basophils Absolute: 0 10*3/uL (ref 0.0–0.1)
Basophils Relative: 0 %
Eosinophils Absolute: 0 10*3/uL (ref 0.0–0.5)
Eosinophils Relative: 0 %
HCT: 35.2 % — ABNORMAL LOW (ref 36.0–46.0)
Hemoglobin: 12 g/dL (ref 12.0–15.0)
Immature Granulocytes: 0 %
Lymphocytes Relative: 19 %
Lymphs Abs: 1.3 10*3/uL (ref 0.7–4.0)
MCH: 33.7 pg (ref 26.0–34.0)
MCHC: 34.1 g/dL (ref 30.0–36.0)
MCV: 98.9 fL (ref 80.0–100.0)
Monocytes Absolute: 0.3 10*3/uL (ref 0.1–1.0)
Monocytes Relative: 4 %
Neutro Abs: 5.2 10*3/uL (ref 1.7–7.7)
Neutrophils Relative %: 77 %
Platelets: 185 10*3/uL (ref 150–400)
RBC: 3.56 MIL/uL — ABNORMAL LOW (ref 3.87–5.11)
RDW: 13.1 % (ref 11.5–15.5)
WBC: 6.8 10*3/uL (ref 4.0–10.5)
nRBC: 0 % (ref 0.0–0.2)

## 2023-02-27 LAB — BASIC METABOLIC PANEL
Anion gap: 12 (ref 5–15)
BUN: 13 mg/dL (ref 6–20)
CO2: 20 mmol/L — ABNORMAL LOW (ref 22–32)
Calcium: 9.2 mg/dL (ref 8.9–10.3)
Chloride: 103 mmol/L (ref 98–111)
Creatinine, Ser: 0.92 mg/dL (ref 0.44–1.00)
GFR, Estimated: >60 mL/min (ref >60)
Glucose, Bld: 84 mg/dL (ref 70–99)
Potassium: 3.6 mmol/L (ref 3.5–5.1)
Sodium: 135 mmol/L (ref 135–145)

## 2023-02-27 LAB — TROPONIN I (HIGH SENSITIVITY): Troponin I (High Sensitivity): 4 ng/L (ref <18)

## 2023-02-27 MED ORDER — SODIUM CHLORIDE 0.9 % IV BOLUS
500.0000 mL | Freq: Once | INTRAVENOUS | Status: AC
  Administered 2023-02-27: 500 mL via INTRAVENOUS

## 2023-02-27 MED ORDER — HYDROXYZINE HCL 25 MG PO TABS: 25.0000 mg | ORAL_TABLET | Freq: Four times a day (QID) | ORAL | 2 refills | Status: DC | PRN

## 2023-02-27 NOTE — ED Triage Notes (Signed)
Patient is [redacted] weeks pregnant. Seen at [facility] for prenatal care. C/o Dizziness (the room spins) and sob. Reports history of vertigo

## 2023-02-27 NOTE — OB Triage Note (Signed)
Pt medically cleared from ED and sent to L&D for fetal NST. Pt states she has been feeling light-headed and dizzy for the last two weeks. Pt went to work today and started to feel dizzy at 0930 and came to ED. Pt reports sometimes "holding her breath" during a braxton hicks contraction. Pt denies LOF and VB and reports positive fetal movement. Monitors applied and assessing.

## 2023-02-27 NOTE — ED Provider Notes (Signed)
[facility] Provider Note    Event Date/Time   First MD Initiated Contact with Patient 02/27/23 1103     (approximate)   History   Shortness of Breath and Dizziness   HPI  Gabriela Thomas is a 42 y.o. female G4P1021 at [redacted] weeks gestation who presents with dizziness over approximate last 2 weeks.  The dizziness occurs in episodes that sometimes last a few minutes to over an hour.  She describes a feeling of lightheadedness and weakness, and feels like she will pass out.  Sometimes she has flashes or spots in her vision.  She also has a sensation of spinning of the room, but this has been constant since the beginning of her pregnancy.  She has a history of vertigo prior to the pregnancy.  However the lightheadedness and feeling like she is going to pass out is new over the last few weeks.  The patient denies any associated headache, fever, chest pain, or other focal symptoms.  She does report some Braxton Hicks contractions and states that they may be associated.  She states that often during the dizziness episodes she feels a tightness and contraction in her abdomen.  She denies any vaginal bleeding or leakage of fluid.  I reviewed the past medical records per the patient was most recently seen by Dr. Marcelline Mates from OB/GYN on 2/28 and at that time also reported flushing, dizziness, and episodes of seeing white.  Dr. Marcelline Mates advised that this was possible vagal response to [facility] contractions.   Physical Exam   Triage Vital Signs: ED Triage Vitals  Enc Vitals Group     BP 02/27/23 1049 93/69     Pulse Rate 02/27/23 1049 62     Resp 02/27/23 1049 18     Temp 02/27/23 1049 98.7 F (37.1 C)     Temp Source 02/27/23 1049 Oral     SpO2 02/27/23 1049 100 %     Weight 02/27/23 1055 175 lb (79.4 kg)     Height 02/27/23 1055 '5\' 4"'$  (1.626 m)     Head Circumference --      Peak Flow --      Pain Score 02/27/23 1055 0     Pain Loc --      Pain Edu? --      Excl.  in [place]? --     Most recent vital signs: Vitals:   02/27/23 1115 02/27/23 1130  BP: 103/73 104/75  Pulse:  74  Resp: 16 18  Temp:    SpO2:  96%     General: Awake, no distress.  CV:  Good peripheral perfusion.  Resp:  Normal effort.  Lungs CTAB. Abd:  Soft and nontender.  No distention.  Other:  EOMI.  PERRLA.  No facial droop.  Motor and sensory intact in all extremities.  No ataxia on finger-to-nose.  No pronator drift.  Dry mucous membranes.   ED Results / Procedures / Treatments   Labs (all labs ordered are listed, but only abnormal results are displayed) Labs Reviewed  BASIC METABOLIC PANEL - Abnormal; Notable for the following components:      Result Value   CO2 20 (*)    All other components within normal limits  CBC WITH DIFFERENTIAL/PLATELET - Abnormal; Notable for the following components:   RBC 3.56 (*)    HCT 35.2 (*)    All other components within normal limits  HEPATIC FUNCTION PANEL - Abnormal; Notable for the following components:  Albumin 3.2 (*)    All other components within normal limits  URINALYSIS, ROUTINE W REFLEX MICROSCOPIC  TROPONIN I (HIGH SENSITIVITY)     EKG  ED ECG REPORT I, Arta Silence, the attending physician, personally viewed and interpreted this ECG.  Date: 02/27/2023 EKG Time: 1056 Rate: 74 Rhythm: normal sinus rhythm QRS Axis: normal Intervals: normal ST/T Wave abnormalities: normal Narrative Interpretation: no evidence of acute ischemia    RADIOLOGY   PROCEDURES:  Critical Care performed: No  Procedures   MEDICATIONS ORDERED IN ED: Medications  sodium chloride 0.9 % bolus 500 mL (0 mLs Intravenous Stopped 02/27/23 1344)     IMPRESSION / MDM / ASSESSMENT AND PLAN / ED COURSE  I reviewed the triage vital signs and the nursing notes.  42 year old female at [redacted] weeks gestation presents with recurrent episodes of dizziness and near syncope which have been occurring for the last couple of weeks, with an  episode today which lasted about an hour and has now resolved.  She does seem to have contractions associated with these episodes.  Vital signs and neurologic exam are currently normal.  She has somewhat dry mucous membranes.  Differential diagnosis includes, but is not limited to, [employer] contractions, vasovagal near syncope, dehydration/hypovolemia, electrolyte abnormalities, other metabolic disturbance.  We will obtain lab workup including cardiac enzymes and LFTs, give a fluid bolus, and reassess.  I will consult OB/GYN as the patient likely will need to go upstairs for monitoring.  Patient's presentation is most consistent with acute presentation with potential threat to life or bodily function.  The patient is on the cardiac monitor to evaluate for evidence of arrhythmia and/or significant heart rate changes.   ----------------------------------------- 1:45 PM on 02/27/2023 -----------------------------------------  The patient has received fluids and remains asymptomatic.  Lab workup is unremarkable.  There is no anemia or leukocytosis.  Electrolytes and LFTs are normal.  Troponin is negative.  Given the onset of the symptoms, there is no indication for repeat.  I consulted and discussed the case with CNM Dominic from [facility] who recommends sending the patient upstairs for monitoring on labor and delivery.  The patient is stable for discharge from the ED at this time.  I counseled her on the results of the workup and the plan of care.  I gave strict return precautions and she expressed understanding.   FINAL CLINICAL IMPRESSION(S) / ED DIAGNOSES   Final diagnoses:  Near syncope     Rx / DC Orders   ED Discharge Orders     None        Note:  This document was prepared using Dragon voice recognition software and may include unintentional dictation errors.    Arta Silence, MD 02/27/23 1346

## 2023-02-27 NOTE — ED Notes (Signed)
Report to [facility] in [place], IV left in place, pt transported to [facility] for observation.

## 2023-02-27 NOTE — Discharge Summary (Signed)
Physician Final Progress Note  Patient ID: Gabriela Thomas MRN: [id number] DOB/AGE: 1981/07/15 42 y.o.  Admit date: 02/27/2023 Admitting provider: Harlin Heys, MD Discharge date: 02/27/2023   Admission Diagnoses:  1) intrauterine pregnancy at [redacted]w[redacted]d 2) dizziness   Discharge Diagnoses:  Active Problems:   * No active hospital problems. *    History of Present Illness: The patient is a 42y.o. female G4P1021 at [id number] presents for dizziness. This has been an on going problem since February 10. The dizziness happens first thing in the morning and will last a few hours. She will feel hot and sweaty, see white spots, and then get cold. She will feel her chest get a little tight. At times she will notice it with a Braxton-Hicks contraction. She has a hx of CHTN, Her Procardia has been decreased to '30mg'$ . She ties to drink water and Gatorade upon waking. She does eat salty snacks. When she has these symptoms, her BP is low. These symptoms now occur daily. She was seen on 2/11 for the similar complaints, during that evaluation PVC's were seen on EKG. Today her EKG noted to be normal sinus rhythm. She has a cardiology consult scheduled for 3/29.   This cnm was in the room conversing with the pt for more than 20 mins, during that time the pt did not exhibit any symptoms.   Past Medical History:  Diagnosis Date   Anxiety    Asthma    AS A CHILD -NO INHALERS   Fibroid    GERD (gastroesophageal reflux disease)    OCC   Headache    Hypertension    Macromastia    Miscarriage    PONV (postoperative nausea and vomiting)     Past Surgical History:  Procedure Laterality Date   BREAST REDUCTION SURGERY Bilateral 07/14/2020   Procedure: MAMMARY REDUCTION  (BREAST);  Surgeon: PaCindra PresumeMD;  [facility]: [facility] Service: Plastics;  Laterality: Bilateral;   LAPAROTOMY N/A 12/12/2018   Procedure: EXPLORATORY LAPAROTOMY, evacuation of hemoperitoneum, aspiration of left  adnexal cyst;  Surgeon: ChRubie MaidMD;  Location: [facility];  Service: Gynecology;  Laterality: N/A;   MYOMECTOMY N/A 12/11/2018   Procedure: ABDOMINAL MYOMECTOMY;  Surgeon: ChRubie MaidMD;  Location: [facility];  Service: Gynecology;  Laterality: N/A;    No current facility-administered medications on file prior to encounter.   Current Outpatient Medications on File Prior to Encounter  Medication Sig Dispense Refill   aspirin 81 MG chewable tablet Chew by mouth daily.     folic acid (FOLVITE) 80Q000111QCG tablet Take 800 mcg by mouth daily.     NIFEdipine (PROCARDIA XL) 60 MG 24 hr tablet Take 1 tablet (60 mg total) by mouth daily. 30 tablet 11   Prenatal Vit-Fe Fumarate-FA (MULTIVITAMIN-PRENATAL) 27-0.8 MG TABS tablet Take 1 tablet by mouth daily at 12 noon.     metroNIDAZOLE (FLAGYL) 500 MG tablet Take 1 tablet (500 mg total) by mouth 2 (two) times daily. (Patient not taking: Reported on 02/27/2023) 14 tablet 0    No Known Allergies  Social History   Socioeconomic History   Marital status: Single    Spouse name: Not on file   Number of children: 1   Years of education: 16   Highest education level: Not on file  Occupational History   Occupation: maFreight forwardert [place]   Smoking status: Never   Smokeless tobacco: Never  Vaping Use   Vaping Use:  Never used  Substance and Sexual Activity   Alcohol use: Not Currently    Alcohol/week: 7.0 standard drinks of alcohol    Types: 7 Glasses of wine per week   Drug use: No   Sexual activity: Yes    Partners: Male    Birth control/protection: Pill  Other Topics Concern   Not on file  Social History Narrative   Not on file   Social Determinants of Health   Financial Resource Strain: Low Risk  (10/25/2022)   Overall Financial Resource Strain (CARDIA)    Difficulty of Paying Living Expenses: Not hard at all  Food Insecurity: No Food Insecurity (10/25/2022)   Hunger Vital Sign    Worried About Running Out of Food in the  Last Year: Never true    [place] in the Last Year: Never true  Transportation Needs: No Transportation Needs (10/25/2022)   PRAPARE - Hydrologist (Medical): No    Lack of Transportation (Non-Medical): No  Physical Activity: Insufficiently Active (10/25/2022)   Exercise Vital Sign    Days of Exercise per Week: 3 days    Minutes of Exercise per Session: 30 min  Stress: No Stress Concern Present (10/25/2022)   [place]    Feeling of Stress : Not at all  Social Connections: Moderately Isolated (10/25/2022)   Social Connection and Isolation Panel [NHANES]    Frequency of Communication with Friends and Family: More than three times a week    Frequency of Social Gatherings with Friends and Family: Not on file    Attends Religious Services: Never    Marine scientist or Organizations: No    Attends Archivist Meetings: Never    Marital Status: Living with partner  Intimate Partner Violence: Not At Risk (10/25/2022)   Humiliation, Afraid, Rape, and Kick questionnaire    Fear of Current or Ex-Partner: No    Emotionally Abused: No    Physically Abused: No    Sexually Abused: No    Family History  Problem Relation Age of Onset   Hypertension Mother    Diabetes Mother    Congestive Heart Failure Mother    Obesity Mother    Alcohol abuse Father    Hypertension Maternal Grandfather    Hypertension Maternal Aunt    Hypertension Maternal Uncle      Review of Systems  Constitutional:  Positive for chills.  Eyes:        Sees white spots  Respiratory:  Positive for shortness of breath.   Cardiovascular:        Occasional chest tightness or palpitations.   Gastrointestinal:        Feels full quickly after eating Braxton-hicks contractions   Neurological:  Positive for dizziness.  Psychiatric/Behavioral: Negative.       Physical Exam: BP 117/73 (BP Location: Right  Arm)   Pulse 77   Temp 98.6 F (37 C) (Oral)   Resp 18   Ht '5\' 4"'$  (1.626 m)   Wt 79.4 kg   LMP 08/26/2022 (Exact Date)   BMI 30.04 kg/m   Physical Exam Constitutional:      Appearance: Normal appearance.  Cardiovascular:     Rate and Rhythm: Normal rate and regular rhythm.     Heart sounds: Normal heart sounds.  Pulmonary:     Effort: Pulmonary effort is normal.     Breath sounds: Normal breath sounds.  Abdominal:  Tenderness: There is no abdominal tenderness.     Comments: gravid  Neurological:     Mental Status: She is alert.  Psychiatric:        Mood and Affect: Mood normal.   EFM: baseline 140, moderate variability, pos accel (10x10), neg decel TOCO: occasional contraction   Consults: None  Significant Findings/ Diagnostic Studies: all labs on the ED WNL   Procedures: [place] [facility] Course: The patient was admitted to Labor and Delivery Triage for observation. She had a RNST. She was evaluated in the ED was cleared from a  a cardiac and  neurological standpoint. She did not exhibit any concerning symptoms while in triage. Reviewed pt's status with Dr. Marcelline Mates. Will try Vistaril. Reviewed wild yam root extract and compression stockings with pt. Instructed pt to keep cardiology consult and next [place].   Discharge Condition: good  Disposition:  There are no questions and answers to display.        Diet: Regular diet  Discharge Activity: Activity as tolerated   Allergies as of 02/27/2023   No Known Allergies      Medication List     STOP taking these medications    metroNIDAZOLE 500 MG tablet Commonly known as: FLAGYL       TAKE these medications    aspirin 81 MG chewable tablet Chew by mouth daily.   folic acid Q000111Q MCG tablet Commonly known as: FOLVITE Take 800 mcg by mouth daily.   hydrOXYzine 25 MG tablet Commonly known as: ATARAX Take 1 tablet (25 mg total) by mouth every 6 (six) hours as needed for itching.   multivitamin-prenatal  27-0.8 MG Tabs tablet Take 1 tablet by mouth daily at 12 noon.   NIFEdipine 60 MG 24 hr tablet Commonly known as: Procardia XL Take 1 tablet (60 mg total) by mouth daily.         Total time spent taking care of this patient: 30 minutes  Signed: [place], CNM  02/27/2023, 3:25 PM

## 2023-02-27 NOTE — OB Triage Note (Signed)
Discharge instructions and prescriptions reviewed. Pt verbalized understanding and red flag precautions reviewed. Pt stable at the time of discharge.

## 2023-02-27 NOTE — Discharge Instructions (Signed)
Received to labor and delivery as discussed.  Follow-up with your OB/GYN.  Return to the ER for new, worsening, or persistent severe dizziness, feel like you are in a pass out, abdominal pain or contractions, headache, vision changes, vomiting, or any other new or worsening symptoms that concern you.

## 2023-02-28 ENCOUNTER — Encounter: Payer: Self-pay | Admitting: Obstetrics and Gynecology

## 2023-02-28 NOTE — Telephone Encounter (Signed)
I completed them.  They just needed to be faxed to her job.

## 2023-03-23 ENCOUNTER — Ambulatory Visit (INDEPENDENT_AMBULATORY_CARE_PROVIDER_SITE_OTHER): Payer: BC Managed Care – PPO | Admitting: Obstetrics and Gynecology

## 2023-03-23 ENCOUNTER — Other Ambulatory Visit: Payer: BC Managed Care – PPO

## 2023-03-23 VITALS — BP 97/73 | HR 111 | Wt 177.7 lb

## 2023-03-23 DIAGNOSIS — O09522 Supervision of elderly multigravida, second trimester: Secondary | ICD-10-CM

## 2023-03-23 DIAGNOSIS — O09523 Supervision of elderly multigravida, third trimester: Secondary | ICD-10-CM

## 2023-03-23 DIAGNOSIS — Z3A29 29 weeks gestation of pregnancy: Secondary | ICD-10-CM

## 2023-03-23 DIAGNOSIS — O3429 Maternal care due to uterine scar from other previous surgery: Secondary | ICD-10-CM

## 2023-03-23 DIAGNOSIS — O26893 Other specified pregnancy related conditions, third trimester: Secondary | ICD-10-CM

## 2023-03-23 DIAGNOSIS — O360130 Maternal care for anti-D [Rh] antibodies, third trimester, not applicable or unspecified: Secondary | ICD-10-CM

## 2023-03-23 DIAGNOSIS — O09521 Supervision of elderly multigravida, first trimester: Secondary | ICD-10-CM

## 2023-03-23 DIAGNOSIS — I10 Essential (primary) hypertension: Secondary | ICD-10-CM

## 2023-03-23 LAB — POCT URINALYSIS DIPSTICK OB
Bilirubin, UA: NEGATIVE
Blood, UA: NEGATIVE
Glucose, UA: NEGATIVE
Ketones, UA: NEGATIVE
Leukocytes, UA: NEGATIVE
Nitrite, UA: NEGATIVE
Spec Grav, UA: 1.025 (ref 1.010–1.025)
Urobilinogen, UA: 0.2 E.U./dL
pH, UA: 6.5 (ref 5.0–8.0)

## 2023-03-23 MED ORDER — RHO D IMMUNE GLOBULIN 1500 UNIT/2ML IJ SOSY
300.0000 ug | PREFILLED_SYRINGE | Freq: Once | INTRAMUSCULAR | Status: AC
  Administered 2023-03-23: 300 ug via INTRAMUSCULAR

## 2023-03-23 NOTE — Progress Notes (Signed)
ROB: Patient is a 42 y.o. [id number] at [redacted]w[redacted]d who presents for OB care. Pregnancy complicated by AMA, cHTN (now on no meds due to hypotension, previously on Procardia) and prior myomectomy  Still noting pelvic pressure and abdominal tightening but is improving. Has not had any other dizzy spells with tachycardia this week, since going into work later in the day. For 28 week labs today.  Plans to  breastfeed (although notes concerns since she has had a breast reduction).  Desires no method for contraception (due to h/o infertility). Received Tdap , signed blood consent last visit.  Rhogam administered at this visit. Discussed labor vs PLTCS for mode of delivery. Discussed that h/o myomectomy, did not breach cavity, however patient does not desire to labor due to history. Will plan for C-section at 38 weeks due to h/o HTN as long as BPs remain well controlled). RTC in 2 weeks, for growth scan, and to begin antenatal testing then (will order BPP with [place] for next visit).

## 2023-03-23 NOTE — Progress Notes (Signed)
ROB [redacted]w[redacted]d: She is doing well. She has pain and pressure when she is standing for long periods of time. She has been having Braxton Hick's contractions daily and frequently. She reports good fetal movement. No other concerns today. Rhogam done today.

## 2023-03-24 LAB — 28 WEEKS RH-PANEL
Antibody Screen: NEGATIVE
Basophils Absolute: 0 10*3/uL (ref 0.0–0.2)
Basos: 0 %
EOS (ABSOLUTE): 0.1 10*3/uL (ref 0.0–0.4)
Eos: 1 %
Gestational Diabetes Screen: 95 mg/dL (ref 70–139)
HIV Screen 4th Generation wRfx: NONREACTIVE
Hematocrit: 32.1 % — ABNORMAL LOW (ref 34.0–46.6)
Hemoglobin: 10.9 g/dL — ABNORMAL LOW (ref 11.1–15.9)
Immature Grans (Abs): 0 10*3/uL (ref 0.0–0.1)
Immature Granulocytes: 0 %
Lymphocytes Absolute: 1.8 10*3/uL (ref 0.7–3.1)
Lymphs: 30 %
MCH: 33 pg (ref 26.6–33.0)
MCHC: 34 g/dL (ref 31.5–35.7)
MCV: 97 fL (ref 79–97)
Monocytes Absolute: 0.2 10*3/uL (ref 0.1–0.9)
Monocytes: 4 %
Neutrophils Absolute: 3.9 10*3/uL (ref 1.4–7.0)
Neutrophils: 65 %
Platelets: 176 10*3/uL (ref 150–450)
RBC: 3.3 x10E6/uL — ABNORMAL LOW (ref 3.77–5.28)
RDW: 12.1 % (ref 11.7–15.4)
RPR Ser Ql: NONREACTIVE
WBC: 6 10*3/uL (ref 3.4–10.8)

## 2023-03-25 ENCOUNTER — Encounter: Payer: Self-pay | Admitting: Cardiology

## 2023-03-25 ENCOUNTER — Ambulatory Visit (INDEPENDENT_AMBULATORY_CARE_PROVIDER_SITE_OTHER): Payer: BC Managed Care – PPO | Admitting: Cardiology

## 2023-03-25 ENCOUNTER — Other Ambulatory Visit (INDEPENDENT_AMBULATORY_CARE_PROVIDER_SITE_OTHER): Payer: BC Managed Care – PPO

## 2023-03-25 VITALS — BP 122/79 | HR 75 | Ht 64.5 in | Wt 178.3 lb

## 2023-03-25 DIAGNOSIS — R55 Syncope and collapse: Secondary | ICD-10-CM | POA: Diagnosis not present

## 2023-03-25 DIAGNOSIS — R42 Dizziness and giddiness: Secondary | ICD-10-CM

## 2023-03-25 DIAGNOSIS — Z3A30 30 weeks gestation of pregnancy: Secondary | ICD-10-CM

## 2023-03-25 DIAGNOSIS — R0609 Other forms of dyspnea: Secondary | ICD-10-CM | POA: Diagnosis not present

## 2023-03-25 DIAGNOSIS — O09521 Supervision of elderly multigravida, first trimester: Secondary | ICD-10-CM | POA: Diagnosis not present

## 2023-03-25 NOTE — Progress Notes (Signed)
Cardio-Obstetrics Clinic  New Evaluation  Date:  03/25/2023   ID:  Gabriela Thomas, DOB Jun 12, 1981, MRN [id number]  PCP:  [facility] [place] Providers Cardiologist:  None  Electrophysiologist:  None       Referring MD: Rod Can, CNM   Chief Complaint: " I am having palpitations and dizziness"  History of Present Illness:    Gabriela Thomas is a 42 y.o. female [G4P1021] who is being seen today for the evaluation of palpitations at the request of Rod Can, [place].   Medical hx includes anxiety, asthma, hypertension here today to be evaluated for palpitations and shortness of breath at rest.  She reports that recently she has been experiencing intermittent palpitations with these to sometimes lightheadedness as if she was going to pass out.  She also notes that she has had some shortness of breath at rest.  She is concerned.  She says that sometimes when she experiences the palpitation she coughs that she feels that she is having abdominal contraction and she is worried about this.  She is in office today with her daughter.  She is currently 30 weeks 1 day pregnant.  Prior CV Studies Reviewed: The following studies were reviewed today:   Past Medical History:  Diagnosis Date   Anxiety    Asthma    AS A CHILD -NO INHALERS   Fibroid    GERD (gastroesophageal reflux disease)    OCC   Headache    Hypertension    Macromastia    Miscarriage    PONV (postoperative nausea and vomiting)     Past Surgical History:  Procedure Laterality Date   BREAST REDUCTION SURGERY Bilateral 07/14/2020   Procedure: MAMMARY REDUCTION  (BREAST);  Surgeon: Cindra Presume, MD;  Location: [facility];  Service: Plastics;  Laterality: Bilateral;   LAPAROTOMY N/A 12/12/2018   Procedure: EXPLORATORY LAPAROTOMY, evacuation of hemoperitoneum, aspiration of left adnexal cyst;  Surgeon: Rubie Maid, MD;  Location: [facility];  Service: Gynecology;   Laterality: N/A;   MYOMECTOMY N/A 12/11/2018   Procedure: ABDOMINAL MYOMECTOMY;  Surgeon: Rubie Maid, MD;  Location: [facility];  Service: Gynecology;  Laterality: N/A;      OB History     Gravida  4   Para  1   Term  1   Preterm      AB  2   Living  1      SAB  2   IAB      Ectopic      Multiple      Live Births  1               Current Medications: Current Meds  Medication Sig   aspirin 81 MG chewable tablet Chew by mouth daily.   folic acid (FOLVITE) Q000111Q MCG tablet Take 800 mcg by mouth daily.   Prenatal Vit-Fe Fumarate-FA (MULTIVITAMIN-PRENATAL) 27-0.8 MG TABS tablet Take 1 tablet by mouth daily at 12 noon.     Allergies:   Patient has no known allergies.   Social History   Socioeconomic History   Marital status: Single    Spouse name: Not on file   Number of children: 1   Years of education: 16   Highest education level: Not on file  Occupational History   Occupation: Freight forwarder at [employer]   Smoking status: Never   Smokeless tobacco: Never  Vaping Use   Vaping Use: Never used  Substance and Sexual  Activity   Alcohol use: Not Currently    Alcohol/week: 7.0 standard drinks of alcohol    Types: 7 Glasses of wine per week   Drug use: No   Sexual activity: Yes    Partners: Male    Birth control/protection: Pill  Other Topics Concern   Not on file  Social History Narrative   Not on file   Social Determinants of Health   Financial Resource Strain: Low Risk  (10/25/2022)   Overall Financial Resource Strain (CARDIA)    Difficulty of Paying Living Expenses: Not hard at all  Food Insecurity: No Food Insecurity (10/25/2022)   Hunger Vital Sign    Worried About Running Out of Food in the Last Year: Never true    [place] in the Last Year: Never true  Transportation Needs: No Transportation Needs (10/25/2022)   PRAPARE - Hydrologist (Medical): No    Lack of Transportation (Non-Medical): No   Physical Activity: Insufficiently Active (10/25/2022)   Exercise Vital Sign    Days of Exercise per Week: 3 days    Minutes of Exercise per Session: 30 min  Stress: No Stress Concern Present (10/25/2022)   [place]    Feeling of Stress : Not at all  Social Connections: Moderately Isolated (10/25/2022)   Social Connection and Isolation Panel [NHANES]    Frequency of Communication with Friends and Family: More than three times a week    Frequency of Social Gatherings with Friends and Family: Not on file    Attends Religious Services: Never    Marine scientist or Organizations: No    Attends Music therapist: Never    Marital Status: Living with partner      Family History  Problem Relation Age of Onset   Hypertension Mother    Diabetes Mother    Congestive Heart Failure Mother    Obesity Mother    Alcohol abuse Father    Hypertension Maternal Grandfather    Hypertension Maternal Aunt    Hypertension Maternal Uncle       ROS:   Please see the history of present illness.    Palpiations and shortness of breath All other systems reviewed and are negative.   Labs/EKG Reviewed:    EKG:   EKG is was not ordered today.    Recent Labs: 02/27/2023: ALT 24; BUN 13; Creatinine, Ser 0.92; Potassium 3.6; Sodium 135 03/23/2023: Hemoglobin 10.9; Platelets 176   Recent Lipid Panel No results found for: "CHOL", "TRIG", "HDL", "CHOLHDL", "LDLCALC", "LDLDIRECT"  Physical Exam:    VS:  BP 122/79   Pulse 75   Ht 5' 4.5" (1.638 m)   Wt 178 lb 4.8 oz (80.9 kg)   LMP 08/26/2022 (Exact Date)   SpO2 99%   BMI 30.13 kg/m     Wt Readings from Last 3 Encounters:  03/25/23 178 lb 4.8 oz (80.9 kg)  03/23/23 177 lb 11.2 oz (80.6 kg)  02/27/23 175 lb (79.4 kg)     GEN:  Well nourished, well developed in no acute distress HEENT: Normal NECK: No JVD; No carotid bruits LYMPHATICS: No  lymphadenopathy CARDIAC: RRR, no murmurs, rubs, gallops RESPIRATORY:  Clear to auscultation without rales, wheezing or rhonchi  ABDOMEN: Soft, non-tender, non-distended MUSCULOSKELETAL:  trace bilateral edema; No deformity  SKIN: Warm and dry NEUROLOGIC:  Alert and oriented x 3 PSYCHIATRIC:  Normal affect    Risk Assessment/Risk Calculators:  CARPREG II Risk Prediction Index Score:  1.  The patient's risk for a primary cardiac event is 5%.            ASSESSMENT & PLAN:    Palpitations  Presyncope Dyspnea at rest Advanced maternal age  I would like to rule out a cardiovascular etiology of this palpitation and presyncope, therefore at this time I would like to placed a zio patch for 7  days. In additon her dyspnea on exertion, I plan to get a transthoracic echocardiogram will be ordered to assess LV/RV function and any structural abnormalities. Once these testing have been performed amd reviewed further reccomendations will be made. For now, I do reccomend that the patient goes to the nearest ED if  symptoms recur.  Continue ASA 81 mg for preeclampsia prophylaxis.   Patient Instructions  Medication Instructions:  Your physician recommends that you continue on your current medications as directed. Please refer to the Current Medication list given to you today.  *If you need a refill on your cardiac medications before your next appointment, please call your pharmacy*   Lab Work: None   Testing/Procedures: [place] Monitor Instructions  Your physician has requested you wear a ZIO patch monitor for 7 days.  This is a single patch monitor. [employer] supplies one patch monitor per enrollment. Additional stickers are not available. Please do not apply patch if you will be having a Nuclear Stress Test,  Echocardiogram, Cardiac CT, MRI, or Chest Xray during the period you would be wearing the  monitor. The patch cannot be worn during these tests. You cannot remove and  re-apply the  ZIO XT patch monitor.   If your monitor falls off in less than 4 days, contact our Monitor department at [phone].  If your monitor becomes loose or falls off after 4 days call [employer] at [phone] for  suggestions on securing your monitor  Your physician has requested that you have an echocardiogram. Echocardiography is a painless test that uses sound waves to create images of your heart. It provides your doctor with information about the size and shape of your heart and how well your heart's chambers and valves are working. This procedure takes approximately one hour. There are no restrictions for this procedure. Please do NOT wear cologne, perfume, aftershave, or lotions (deodorant is allowed). Please arrive 15 minutes prior to your appointment time.   Follow-Up: At [facility], you and your health needs are our priority.  As part of our continuing mission to provide you with exceptional heart care, we have created designated Provider Care Teams.  These Care Teams include your primary Cardiologist (physician) and Advanced Practice Providers (APPs -  Physician Assistants and Nurse Practitioners) who all work together to provide you with the care you need, when you need it.   Your next appointment:   6 week(s) MCW or NL  Provider:   Berniece Salines, DO    Dispo:  No follow-ups on file.   Medication Adjustments/Labs and Tests Ordered: Current medicines are reviewed at length with the patient today.  Concerns regarding medicines are outlined above.  Tests Ordered: Orders Placed This Encounter  Procedures   LONG TERM MONITOR (3-14 DAYS)   ECHOCARDIOGRAM COMPLETE   Medication Changes: No orders of the defined types were placed in this encounter.

## 2023-03-25 NOTE — Patient Instructions (Addendum)
Medication Instructions:  Your physician recommends that you continue on your current medications as directed. Please refer to the Current Medication list given to you today.  *If you need a refill on your cardiac medications before your next appointment, please call your pharmacy*   Lab Work: None   Testing/Procedures: [place] Monitor Instructions  Your physician has requested you wear a ZIO patch monitor for 7 days.  This is a single patch monitor. [employer] supplies one patch monitor per enrollment. Additional stickers are not available. Please do not apply patch if you will be having a Nuclear Stress Test,  Echocardiogram, Cardiac CT, MRI, or Chest Xray during the period you would be wearing the  monitor. The patch cannot be worn during these tests. You cannot remove and re-apply the  ZIO XT patch monitor.   If your monitor falls off in less than 4 days, contact our Monitor department at [phone].  If your monitor becomes loose or falls off after 4 days call [employer] at [phone] for  suggestions on securing your monitor  Your physician has requested that you have an echocardiogram. Echocardiography is a painless test that uses sound waves to create images of your heart. It provides your doctor with information about the size and shape of your heart and how well your heart's chambers and valves are working. This procedure takes approximately one hour. There are no restrictions for this procedure. Please do NOT wear cologne, perfume, aftershave, or lotions (deodorant is allowed). Please arrive 15 minutes prior to your appointment time.   Follow-Up: At [facility], you and your health needs are our priority.  As part of our continuing mission to provide you with exceptional heart care, we have created designated Provider Care Teams.  These Care Teams include your primary Cardiologist (physician) and Advanced Practice Providers (APPs -  Physician Assistants and  Nurse Practitioners) who all work together to provide you with the care you need, when you need it.   Your next appointment:   6 week(s) MCW or NL  Provider:   Berniece Salines, DO

## 2023-03-26 ENCOUNTER — Encounter: Payer: Self-pay | Admitting: Obstetrics and Gynecology

## 2023-04-06 ENCOUNTER — Ambulatory Visit (INDEPENDENT_AMBULATORY_CARE_PROVIDER_SITE_OTHER): Payer: BC Managed Care – PPO | Admitting: Obstetrics and Gynecology

## 2023-04-06 ENCOUNTER — Encounter: Payer: Self-pay | Admitting: Obstetrics and Gynecology

## 2023-04-06 ENCOUNTER — Ambulatory Visit (INDEPENDENT_AMBULATORY_CARE_PROVIDER_SITE_OTHER): Payer: BC Managed Care – PPO

## 2023-04-06 VITALS — BP 116/72 | HR 68 | Wt 177.1 lb

## 2023-04-06 DIAGNOSIS — Z3A31 31 weeks gestation of pregnancy: Secondary | ICD-10-CM | POA: Diagnosis not present

## 2023-04-06 DIAGNOSIS — O09523 Supervision of elderly multigravida, third trimester: Secondary | ICD-10-CM | POA: Diagnosis not present

## 2023-04-06 DIAGNOSIS — O09522 Supervision of elderly multigravida, second trimester: Secondary | ICD-10-CM

## 2023-04-06 DIAGNOSIS — I10 Essential (primary) hypertension: Secondary | ICD-10-CM

## 2023-04-06 DIAGNOSIS — O0993 Supervision of high risk pregnancy, unspecified, third trimester: Secondary | ICD-10-CM

## 2023-04-06 LAB — POCT URINALYSIS DIPSTICK OB
Bilirubin, UA: NEGATIVE
Blood, UA: NEGATIVE
Glucose, UA: NEGATIVE
Ketones, UA: NEGATIVE
Leukocytes, UA: NEGATIVE
Nitrite, UA: NEGATIVE
Spec Grav, UA: 1.025 (ref 1.010–1.025)
Urobilinogen, UA: 0.2 E.U./dL
pH, UA: 6 (ref 5.0–8.0)

## 2023-04-06 NOTE — Progress Notes (Signed)
ROB [redacted]w[redacted]d: She is doing well. She had some pressure earlier this week. She has been having on and off some vaginal pressure when she stands for a long period of time. She continues to have Braxton Hick's contraction. She reports good fetal movement and she has no new concerns today.

## 2023-04-06 NOTE — Progress Notes (Signed)
ROB: Patient is a 42 y.o. Y5T0931 at [redacted]w[redacted]d who presents for routine OB care.  Pregnancy is complicated by cHTN in pregnancy (currently no meds), History of uterine fibroid s/p myomectomy,  AMA (advanced maternal age) multigravida 35+, first trimester; Rh negative state in antepartum period; Patient has no major complaints. Notes that she is still having those upper abdominal cramping/spasm episodes but is just "pushing through them".  Notes her stomach feels tight.  Reviewed recent growth scan, growth 40%ile, AFI 19.9 cm, BPP 8/8.  Patient to begin weekly NSTs starting next week, will also have next growth scan in 4 weeks. Discussed delivery between 38-39 weeks if BPs remain controlled. RTC in 2 weeks.

## 2023-04-15 DIAGNOSIS — Z3A33 33 weeks gestation of pregnancy: Secondary | ICD-10-CM | POA: Insufficient documentation

## 2023-04-15 DIAGNOSIS — O10913 Unspecified pre-existing hypertension complicating pregnancy, third trimester: Secondary | ICD-10-CM | POA: Insufficient documentation

## 2023-04-15 NOTE — Progress Notes (Unsigned)
    NURSE VISIT NOTE  Subjective:    Patient ID: AMRUTHA AVERA, female    DOB: 1981/07/03, 42 y.o.   MRN: [id number]  HPI  Patient is a 42 y.o. G47P1021 female who presents for fetal monitoring per order from Hildred Laser, MD.   Objective:    LMP 08/26/2022 (Exact Date)  Estimated Date of Delivery: 06/02/23  [redacted]w[redacted]d  Fetus A Non-Stress Test Interpretation for 04/15/23  Indication: Chronic Hypertenstion            Assessment:   1. Maternal chronic hypertension, third trimester   2. [redacted] weeks gestation of pregnancy      Plan:   Results reviewed and discussed with patient by  Hildred Laser, MD.     Rocco Serene, LPN

## 2023-04-15 NOTE — Patient Instructions (Signed)

## 2023-04-18 ENCOUNTER — Other Ambulatory Visit: Payer: BC Managed Care – PPO

## 2023-04-19 ENCOUNTER — Ambulatory Visit (INDEPENDENT_AMBULATORY_CARE_PROVIDER_SITE_OTHER): Payer: BC Managed Care – PPO

## 2023-04-19 ENCOUNTER — Encounter: Payer: Self-pay | Admitting: Obstetrics and Gynecology

## 2023-04-19 ENCOUNTER — Ambulatory Visit: Payer: BC Managed Care – PPO | Admitting: Obstetrics and Gynecology

## 2023-04-19 VITALS — BP 121/85 | HR 86 | Wt 179.1 lb

## 2023-04-19 VITALS — BP 121/85 | HR 86 | Ht 64.0 in | Wt 179.1 lb

## 2023-04-19 DIAGNOSIS — O10013 Pre-existing essential hypertension complicating pregnancy, third trimester: Secondary | ICD-10-CM | POA: Diagnosis not present

## 2023-04-19 DIAGNOSIS — Z01818 Encounter for other preprocedural examination: Secondary | ICD-10-CM

## 2023-04-19 DIAGNOSIS — O09521 Supervision of elderly multigravida, first trimester: Secondary | ICD-10-CM

## 2023-04-19 DIAGNOSIS — Z3A33 33 weeks gestation of pregnancy: Secondary | ICD-10-CM

## 2023-04-19 DIAGNOSIS — O09522 Supervision of elderly multigravida, second trimester: Secondary | ICD-10-CM

## 2023-04-19 DIAGNOSIS — O10913 Unspecified pre-existing hypertension complicating pregnancy, third trimester: Secondary | ICD-10-CM

## 2023-04-19 LAB — POCT URINALYSIS DIPSTICK OB
Bilirubin, UA: NEGATIVE
Blood, UA: NEGATIVE
Glucose, UA: NEGATIVE
Ketones, UA: NEGATIVE
Leukocytes, UA: NEGATIVE
Nitrite, UA: NEGATIVE
Spec Grav, UA: 1.02 (ref 1.010–1.025)
Urobilinogen, UA: 0.2 E.U./dL
pH, UA: 6.5 (ref 5.0–8.0)

## 2023-04-19 NOTE — Progress Notes (Signed)
ROB: Patient is a 42 y.o. [id number] at [redacted]w[redacted]d who presents for routine OB care.  Pregnancy is complicated by cHTN in pregnancy (currently no meds), History of uterine fibroid s/p myomectomy,  AMA (advanced maternal age) multigravida 35+, first trimester; Rh negative state in antepartum period; Patient has no major complaints. Discussed plans for delivery.  Currently BPs now well controlled off medications (held in mid second trimester due to hypotension), advised that we will plan for delivery at 39 weeks unless BPs begin to become elevated again requiring treatment. Desires C/S. Continue weekly NSTs, for next growth scan in 2 weeks. RTC in 2 weeks.     NONSTRESS TEST INTERPRETATION  INDICATIONS:  CHTN, [facility]   04/19/2023    1412  Fetal Testing Method   Fetal testing type NST  Fetal Heart Rate A   Mode External  Baseline Rate (A) 145 bpm  Variability 6-25 BPM  Accelerations 15 x 15  Decelerations None  Multiple birth? No  Interpretation (Fetal Testing)   Nonstress Test Interpretation Reactive  Overall Impression Reassuring for gestational age  Uterine Activity   Mode Toco  Contraction Frequency (min) None    PLAN: 1. Continue fetal kick counts twice a day. 2. Continue antepartum testing as scheduled-weekly

## 2023-04-19 NOTE — Progress Notes (Signed)
ROB [redacted]w[redacted]d: She is doing well. She reports good fetal movement. She had NST today. No new concerns.

## 2023-04-21 ENCOUNTER — Ambulatory Visit (HOSPITAL_COMMUNITY): Payer: BC Managed Care – PPO | Attending: Cardiology

## 2023-04-21 ENCOUNTER — Encounter (HOSPITAL_COMMUNITY): Payer: Self-pay | Admitting: Cardiology

## 2023-04-21 ENCOUNTER — Encounter: Payer: Self-pay | Admitting: Cardiology

## 2023-04-21 ENCOUNTER — Encounter: Payer: Self-pay | Admitting: Obstetrics and Gynecology

## 2023-04-26 DIAGNOSIS — Z3A34 34 weeks gestation of pregnancy: Secondary | ICD-10-CM | POA: Insufficient documentation

## 2023-04-26 NOTE — Progress Notes (Unsigned)
    NURSE VISIT NOTE  Subjective:    Patient ID: ITZAYANNA KASTER, female    DOB: 22-May-1981, 42 y.o.   MRN: [id number]  HPI  Patient is a 42 y.o. G37P1021 female who presents for fetal monitoring per order from Hildred Laser, MD.   Objective:    LMP 08/26/2022 (Exact Date)  Estimated Date of Delivery: 06/02/23  [redacted]w[redacted]d  Fetus A Non-Stress Test Interpretation for 04/26/23  Indication: Chronic Hypertenstion and Advanced Maternal Age >40 years            Assessment:   1. Maternal chronic hypertension, third trimester   2. AMA (advanced maternal age) multigravida 35+, first trimester   3. [redacted] weeks gestation of pregnancy      Plan:   Results reviewed and discussed with patient by  {[email or web address]:[id number]}.     Rocco Serene, LPN

## 2023-04-26 NOTE — Patient Instructions (Signed)

## 2023-04-27 ENCOUNTER — Ambulatory Visit (INDEPENDENT_AMBULATORY_CARE_PROVIDER_SITE_OTHER): Payer: BC Managed Care – PPO

## 2023-04-27 VITALS — BP 128/84 | HR 79 | Ht 64.0 in | Wt 179.3 lb

## 2023-04-27 DIAGNOSIS — Z3A34 34 weeks gestation of pregnancy: Secondary | ICD-10-CM | POA: Diagnosis not present

## 2023-04-27 DIAGNOSIS — O09523 Supervision of elderly multigravida, third trimester: Secondary | ICD-10-CM

## 2023-04-27 DIAGNOSIS — O10913 Unspecified pre-existing hypertension complicating pregnancy, third trimester: Secondary | ICD-10-CM

## 2023-04-27 DIAGNOSIS — O09521 Supervision of elderly multigravida, first trimester: Secondary | ICD-10-CM

## 2023-04-27 DIAGNOSIS — O10013 Pre-existing essential hypertension complicating pregnancy, third trimester: Secondary | ICD-10-CM | POA: Diagnosis not present

## 2023-04-28 ENCOUNTER — Encounter: Payer: Self-pay | Admitting: Obstetrics and Gynecology

## 2023-05-04 ENCOUNTER — Ambulatory Visit (INDEPENDENT_AMBULATORY_CARE_PROVIDER_SITE_OTHER): Payer: BC Managed Care – PPO

## 2023-05-04 ENCOUNTER — Ambulatory Visit (INDEPENDENT_AMBULATORY_CARE_PROVIDER_SITE_OTHER): Payer: BC Managed Care – PPO | Admitting: Obstetrics and Gynecology

## 2023-05-04 ENCOUNTER — Other Ambulatory Visit (HOSPITAL_COMMUNITY)
Admission: RE | Admit: 2023-05-04 | Discharge: 2023-05-04 | Disposition: A | Discharge status: Home / Self Care | Payer: BC Managed Care – PPO | Source: Ambulatory Visit | Attending: Obstetrics and Gynecology | Admitting: Obstetrics and Gynecology

## 2023-05-04 ENCOUNTER — Other Ambulatory Visit: Payer: BC Managed Care – PPO

## 2023-05-04 VITALS — BP 117/78 | HR 86 | Wt 178.4 lb

## 2023-05-04 VITALS — BP 117/78 | HR 86 | Ht 64.0 in | Wt 178.4 lb

## 2023-05-04 DIAGNOSIS — Z3A35 35 weeks gestation of pregnancy: Secondary | ICD-10-CM

## 2023-05-04 DIAGNOSIS — O09523 Supervision of elderly multigravida, third trimester: Secondary | ICD-10-CM | POA: Diagnosis not present

## 2023-05-04 DIAGNOSIS — O09521 Supervision of elderly multigravida, first trimester: Secondary | ICD-10-CM

## 2023-05-04 DIAGNOSIS — Z3A36 36 weeks gestation of pregnancy: Secondary | ICD-10-CM | POA: Insufficient documentation

## 2023-05-04 DIAGNOSIS — R102 Pelvic and perineal pain: Secondary | ICD-10-CM

## 2023-05-04 DIAGNOSIS — Z113 Encounter for screening for infections with a predominantly sexual mode of transmission: Secondary | ICD-10-CM

## 2023-05-04 DIAGNOSIS — O360130 Maternal care for anti-D [Rh] antibodies, third trimester, not applicable or unspecified: Secondary | ICD-10-CM

## 2023-05-04 DIAGNOSIS — O10013 Pre-existing essential hypertension complicating pregnancy, third trimester: Secondary | ICD-10-CM

## 2023-05-04 DIAGNOSIS — O26893 Other specified pregnancy related conditions, third trimester: Secondary | ICD-10-CM | POA: Diagnosis not present

## 2023-05-04 DIAGNOSIS — O10913 Unspecified pre-existing hypertension complicating pregnancy, third trimester: Secondary | ICD-10-CM

## 2023-05-04 DIAGNOSIS — Z3685 Encounter for antenatal screening for Streptococcus B: Secondary | ICD-10-CM

## 2023-05-04 DIAGNOSIS — O10919 Unspecified pre-existing hypertension complicating pregnancy, unspecified trimester: Secondary | ICD-10-CM

## 2023-05-04 DIAGNOSIS — O0993 Supervision of high risk pregnancy, unspecified, third trimester: Secondary | ICD-10-CM

## 2023-05-04 LAB — POCT URINALYSIS DIPSTICK OB
Bilirubin, UA: NEGATIVE
Blood, UA: NEGATIVE
Glucose, UA: NEGATIVE
Ketones, UA: NEGATIVE
Leukocytes, UA: NEGATIVE
Nitrite, UA: NEGATIVE
Spec Grav, UA: 1.025 (ref 1.010–1.025)
Urobilinogen, UA: 0.2 E.U./dL
pH, UA: 6 (ref 5.0–8.0)

## 2023-05-04 NOTE — Progress Notes (Signed)
ROB: Patient is a 42 y.o. [id number] at [redacted]w[redacted]d who presents for routine OB care.  Pregnancy is complicated by cHTN in pregnancy (currently no meds), History of uterine fibroid s/p myomectomy,  AMA (advanced maternal age) multigravida 35+, first trimester; Rh negative state in antepartum period; Patient has reports significant pain and pelvic pressure yesterday. Notes attempting to try to go to work but had to lie back down for several hours. Growth and BPP performed today due to h/o cHTN and AMA status, was 6/8, however with reactive NST performed after this was then 8/10.  Growth 64%ile (however AC 95%ile).  Continue weekly NSTs. 36 week cultures performed today. Will change patient's C-section date from 5/31 to 5/24. Discussed being taken out of work after this week. BPs still wnl today however significant proteinuria present (3+). RTC in 1 weeks.    NONSTRESS TEST INTERPRETATION  INDICATIONS: Chronic hypertension and  AMA   05/04/2023   1437  Fetal Testing Method   Fetal testing type NST  Fetal Heart Rate A   Mode External  Baseline Rate (A) 150 bpm  Variability 6-25 BPM  Accelerations 15 x 15  Decelerations None  Multiple birth? No  Interpretation (Fetal Testing)   Nonstress Test Interpretation Reactive  Overall Impression Reassuring for gestational age  Uterine Activity   Mode Toco  Contraction Frequency (min) none     PLAN: 1. Continue fetal kick counts twice a day. 2. Continue antepartum testing as scheduled-weekly

## 2023-05-04 NOTE — Progress Notes (Addendum)
    NURSE VISIT NOTE  Subjective:    Patient ID: Gabriela Thomas, female    DOB: 1981-09-14, 42 y.o.   MRN: [id number]  HPI  Patient is a 42 y.o. G39P1021 female who presents for fetal monitoring per order from Hildred Laser, MD.   Objective:    BP 117/78   Pulse 86   Ht 5\' 4"  (1.626 m)   Wt 178 lb 6.4 oz (80.9 kg)   LMP 08/26/2022 (Exact Date)   BMI 30.62 kg/m  Estimated Date of Delivery: 06/02/23  [redacted]w[redacted]d  Fetus A Non-Stress Test Interpretation for 05/04/23  Indication: Chronic Hypertenstion and Advanced Maternal Age >40 years  Fetal Heart Rate A Mode: External Baseline Rate (A): 150 bpm Variability: Moderate Accelerations: 15 x 15 Decelerations: None Multiple birth?: No  Uterine Activity Mode: Toco Contraction Frequency (min): none  Interpretation (Fetal Testing) Nonstress Test Interpretation: Reactive Overall Impression: Reassuring for gestational age   Assessment:   1. Maternal chronic hypertension, third trimester   2. AMA (advanced maternal age) multigravida 42+, first trimester   3. [redacted] weeks gestation of pregnancy      Plan:   Results reviewed and discussed with patient by  Hildred Laser, MD.     Rocco Serene, LPN

## 2023-05-04 NOTE — Progress Notes (Signed)
ROB [redacted]w[redacted]d: BPP 6/8, NST. Patient reports good fetal movement with pelvic pain/pressure with irregular contractions. She denies leaking of fluid, vaginal bleeding, abnormal discharge.

## 2023-05-05 ENCOUNTER — Telehealth (HOSPITAL_COMMUNITY): Payer: Self-pay | Admitting: Radiology

## 2023-05-05 NOTE — Telephone Encounter (Signed)
Removing patient from echocardiogram WQ uanble to get in contact with patient.   Action Date and Time User Details  Request Created 03/25/2023 15:48 Reynolds Bowl, RN Appointment Encounter: Details     Appointment Scheduled 03/25/2023 16:03 Omar Person B ECHOCARDIOGRAM on 04/21/2023 at 1:50 PM (No Show)     Appointment No Show 04/21/2023 14:04 Lorn Junes, RCS ECHOCARDIOGRAM on 04/21/2023 at 1:50 PM     Notes Edited 04/21/2023 14:18 Minette Brine     Deferred 04/21/2023 14:21 Minette Brine WQ: [facility] ECHO/NM (365), Deferred until 05/05/2023  0:00  NO [facility] LETTER AND COPIED MD    Deferral Due 05/05/2023 00:40  WQ: [facility] ECHO/NM (365)

## 2023-05-06 ENCOUNTER — Encounter: Payer: Self-pay | Admitting: Obstetrics and Gynecology

## 2023-05-06 ENCOUNTER — Ambulatory Visit: Payer: BC Managed Care – PPO | Admitting: Cardiology

## 2023-05-06 LAB — CERVICOVAGINAL ANCILLARY ONLY
Chlamydia: NEGATIVE
Comment: NEGATIVE
Comment: NORMAL
Neisseria Gonorrhea: NEGATIVE

## 2023-05-06 LAB — STREP GP B NAA: Strep Gp B NAA: POSITIVE — AB

## 2023-05-09 ENCOUNTER — Telehealth: Payer: Self-pay

## 2023-05-09 NOTE — Telephone Encounter (Signed)
[facility], she's still having some dark red blood, I advised her it's coming from the intercourse that took place on Sunday. I advised Gabriela Thomas to monitor it and if it turns into bright red blood to call the office or if after hours go to the ED. Patient understood.

## 2023-05-11 DIAGNOSIS — Z3A37 37 weeks gestation of pregnancy: Secondary | ICD-10-CM | POA: Insufficient documentation

## 2023-05-11 NOTE — Patient Instructions (Signed)

## 2023-05-11 NOTE — Progress Notes (Signed)
    NURSE VISIT NOTE  Subjective:    Patient ID: Gabriela Thomas, female    DOB: 01-Sep-1981, 42 y.o.   MRN: [id number]  HPI  Patient is a 42 y.o. G54P1021 female who presents for fetal monitoring per order from Hildred Laser, MD.   Objective:    BP 121/84   Pulse 88   Ht 5\' 4"  (1.626 m)   Wt 180 lb (81.6 kg)   LMP 08/26/2022 (Exact Date)   BMI 30.90 kg/m  Estimated Date of Delivery: 06/02/23  [redacted]w[redacted]d  Fetus A Non-Stress Test Interpretation for 05/13/23  Indication: Advanced Maternal Age >40 years and Chronic Hypertenstion  Fetal Heart Rate A Mode: External Baseline Rate (A): 150 bpm Variability: Moderate Accelerations: 15 x 15 Decelerations: None Multiple birth?: No  Uterine Activity Mode: Toco Contraction Frequency (min): none  Interpretation (Fetal Testing) Nonstress Test Interpretation: Reactive Overall Impression: Reassuring for gestational age   Assessment:   1. Maternal chronic hypertension, third trimester   2. AMA (advanced maternal age) multigravida 42+, first trimester   3. [redacted] weeks gestation of pregnancy      Plan:   Results reviewed and discussed with patient by  Hildred Laser, MD.     Rocco Serene, LPN

## 2023-05-13 ENCOUNTER — Ambulatory Visit (INDEPENDENT_AMBULATORY_CARE_PROVIDER_SITE_OTHER): Payer: BC Managed Care – PPO

## 2023-05-13 ENCOUNTER — Ambulatory Visit (INDEPENDENT_AMBULATORY_CARE_PROVIDER_SITE_OTHER): Payer: BC Managed Care – PPO | Admitting: Obstetrics and Gynecology

## 2023-05-13 VITALS — BP 121/84 | HR 88 | Ht 64.0 in | Wt 180.0 lb

## 2023-05-13 VITALS — BP 121/84 | HR 88 | Wt 180.0 lb

## 2023-05-13 DIAGNOSIS — Z3A37 37 weeks gestation of pregnancy: Secondary | ICD-10-CM

## 2023-05-13 DIAGNOSIS — O0993 Supervision of high risk pregnancy, unspecified, third trimester: Secondary | ICD-10-CM

## 2023-05-13 DIAGNOSIS — O09521 Supervision of elderly multigravida, first trimester: Secondary | ICD-10-CM

## 2023-05-13 DIAGNOSIS — O10913 Unspecified pre-existing hypertension complicating pregnancy, third trimester: Secondary | ICD-10-CM

## 2023-05-13 DIAGNOSIS — O09523 Supervision of elderly multigravida, third trimester: Secondary | ICD-10-CM

## 2023-05-13 DIAGNOSIS — O10013 Pre-existing essential hypertension complicating pregnancy, third trimester: Secondary | ICD-10-CM

## 2023-05-13 LAB — POCT URINALYSIS DIPSTICK OB
Bilirubin, UA: NEGATIVE
Blood, UA: NEGATIVE
Glucose, UA: NEGATIVE
Ketones, UA: NEGATIVE
Leukocytes, UA: NEGATIVE
Nitrite, UA: NEGATIVE
Spec Grav, UA: 1.025 (ref 1.010–1.025)
Urobilinogen, UA: 0.2 E.U./dL
pH, UA: 6 (ref 5.0–8.0)

## 2023-05-13 NOTE — Progress Notes (Addendum)
ROB: Patient is a 42 y.o. [id number] at [redacted]w[redacted]d who presents for routine OB care.  Pregnancy is complicated by cHTN in pregnancy (currently no meds), History of uterine fibroid s/p myomectomy,  AMA (advanced maternal age) multigravida 35+, first trimester; Rh negative state in antepartum period; Patient noted contractions yesterday, however notes they have improved today.  Continue weekly NSTs. Plan for primary LTCS on 05/20/23.    Fetus A Non-Stress Test Interpretation for 05/13/23   Indication: Advanced Maternal Age >40 years and Chronic Hypertenstion   Fetal Heart Rate A Mode: External Baseline Rate (A): 150 bpm Variability: Moderate Accelerations: 15 x 15 Decelerations: None Multiple birth?: No   Uterine Activity Mode: Toco Contraction Frequency (min): none   Interpretation (Fetal Testing) Nonstress Test Interpretation: Reactive Overall Impression: Reassuring for gestational age    PLAN: 1. Continue fetal kick counts twice a day. 2. Scheduled for primary C/S on on 05/20/2023.

## 2023-05-13 NOTE — Progress Notes (Signed)
ROB [redacted]w[redacted]d: She is doing well. Reports good fetal movement. She had some pain last night that started in her back and radiated to her lower pelvic area.

## 2023-05-18 ENCOUNTER — Encounter: Payer: Self-pay | Admitting: Obstetrics and Gynecology

## 2023-05-18 ENCOUNTER — Encounter: Payer: Self-pay | Admitting: Urgent Care

## 2023-05-18 ENCOUNTER — Telehealth: Payer: Self-pay | Admitting: Obstetrics and Gynecology

## 2023-05-18 ENCOUNTER — Encounter
Admission: RE | Admit: 2023-05-18 | Discharge: 2023-05-18 | Disposition: A | Discharge status: Home / Self Care | Payer: BC Managed Care – PPO | Source: Ambulatory Visit | Attending: Obstetrics and Gynecology | Admitting: Obstetrics and Gynecology

## 2023-05-18 ENCOUNTER — Other Ambulatory Visit: Payer: Self-pay

## 2023-05-18 DIAGNOSIS — O10919 Unspecified pre-existing hypertension complicating pregnancy, unspecified trimester: Secondary | ICD-10-CM

## 2023-05-18 DIAGNOSIS — Z01812 Encounter for preprocedural laboratory examination: Secondary | ICD-10-CM | POA: Insufficient documentation

## 2023-05-18 DIAGNOSIS — Z3A00 Weeks of gestation of pregnancy not specified: Secondary | ICD-10-CM | POA: Insufficient documentation

## 2023-05-18 DIAGNOSIS — Z01818 Encounter for other preprocedural examination: Secondary | ICD-10-CM

## 2023-05-18 HISTORY — DX: Unspecified asthma, uncomplicated: J45.909

## 2023-05-18 HISTORY — DX: Unspecified pre-existing hypertension complicating pregnancy, third trimester: O10.913

## 2023-05-18 LAB — COMPREHENSIVE METABOLIC PANEL
ALT: 13 U/L (ref 0–44)
AST: 21 U/L (ref 15–41)
Albumin: 3.1 g/dL — ABNORMAL LOW (ref 3.5–5.0)
Alkaline Phosphatase: 74 U/L (ref 38–126)
Anion gap: 9 (ref 5–15)
BUN: 11 mg/dL (ref 6–20)
CO2: 20 mmol/L — ABNORMAL LOW (ref 22–32)
Calcium: 8.9 mg/dL (ref 8.9–10.3)
Chloride: 108 mmol/L (ref 98–111)
Creatinine, Ser: 0.97 mg/dL (ref 0.44–1.00)
GFR, Estimated: >60 mL/min (ref >60)
Glucose, Bld: 102 mg/dL — ABNORMAL HIGH (ref 70–99)
Potassium: 3.6 mmol/L (ref 3.5–5.1)
Sodium: 137 mmol/L (ref 135–145)
Total Bilirubin: 0.9 mg/dL (ref 0.3–1.2)
Total Protein: 6.3 g/dL — ABNORMAL LOW (ref 6.5–8.1)

## 2023-05-18 LAB — CBC
HCT: 35.3 % — ABNORMAL LOW (ref 36.0–46.0)
Hemoglobin: 11.9 g/dL — ABNORMAL LOW (ref 12.0–15.0)
MCH: 32.5 pg (ref 26.0–34.0)
MCHC: 33.7 g/dL (ref 30.0–36.0)
MCV: 96.4 fL (ref 80.0–100.0)
Platelets: 164 10*3/uL (ref 150–400)
RBC: 3.66 MIL/uL — ABNORMAL LOW (ref 3.87–5.11)
RDW: 13.1 % (ref 11.5–15.5)
WBC: 6.1 10*3/uL (ref 4.0–10.5)
nRBC: 0 % (ref 0.0–0.2)

## 2023-05-18 LAB — TYPE AND SCREEN
ABO/RH(D): B NEG
Antibody Screen: POSITIVE
Extend sample reason: UNDETERMINED

## 2023-05-18 NOTE — Patient Instructions (Addendum)
Your procedure is scheduled on: May 24/2024  Arrival Time: Please call Labor and Delivery the day before your scheduled C-Section to find out your arrival time. [phone].  Arrival: If your arrival time is prior to 6:00 am, please enter through the Emergency Room Entrance and you will be directed to Labor and Delivery. If your arrival time is 6:00 am or later, please enter the Medical Mall and follow the greeter's instructions.  REMEMBER: Instructions that are not followed completely may result in serious medical risk, up to and including death; or upon the discretion of your surgeon and anesthesiologist your surgery may need to be rescheduled.  Do not eat food after midnight the night before surgery.  No gum chewing or hard candies.  One week prior to surgery: Stop Anti-inflammatories (NSAIDS) such as Advil, Aleve, Ibuprofen, Motrin, Naproxen, Naprosyn and Aspirin based products such as Excedrin, Goody's Powder, BC Powder. Stop ANY OVER THE COUNTER supplements until after surgery. You may however, continue to take Tylenol if needed for pain up until the day of surgery.  Continue taking all prescribed medications with the exception of the following:    Aspirin-  --------Follow recommendations from Cardiologist or PCP or surgeon regarding stopping blood thinners.  TAKE ONLY THESE MEDICATIONS THE MORNING OF SURGERY WITH A SIP OF WATER:  Folic Pre-natal Pepcid Stool softener    No Alcohol for 24 hours before or after surgery.  No Smoking including e-cigarettes for 24 hours prior to surgery.  No chewable tobacco products for at least 6 hours prior to surgery.  No nicotine patches on the day of surgery.  Do not use any "recreational" drugs for at least a week prior to your surgery.  Please be advised that the combination of cocaine and anesthesia may have negative outcomes, up to and including death. If you test positive for cocaine, your surgery will be cancelled.  On the  morning of surgery brush your teeth with toothpaste and water, you may rinse your mouth with mouthwash if you wish. Do not swallow any toothpaste or mouthwash.  Use CHG wipes as directed on instruction sheet.  Do not wear jewelry, make-up, hairpins, clips or nail polish.  Do not wear lotions, powders, or perfumes.   Do not shave body hair from the neck down 48 hours before surgery.  Contact lenses, hearing aids and dentures may not be worn into surgery.  Do not bring valuables to the hospital. [facility] is not responsible for any missing/lost belongings or valuables.    Notify your doctor if there is any change in your medical condition (cold, fever, infection).  Wear comfortable clothing (specific to your surgery type) to the hospital.  After surgery, you can help prevent lung complications by doing breathing exercises.  Take deep breaths and cough every 1-2 hours. Your doctor may order a device called an Incentive Spirometer to help you take deep breaths. When coughing or sneezing, hold a pillow firmly against your incision with both hands. This is called "splinting." Doing this helps protect your incision. It also decreases belly discomfort.  Please call the Pre-admissions Testing Dept. at [phone] if you have any questions about these instructions.  Surgery Visitation Policy:  Visitor Passes   All visitors, including children, need an identification sticker when visiting. These stickers must be worn where they can be seen.   Labor & Delivery  Laboring women may have one designated support person and two other visitors of any age visit. The support person must remain the  same. The visitors may switch with other visitors. Visitation is permitted 24 hours per day. The designated support person or a visitor over the age of 16 may sleep overnight in the patient's room. A doula registered with [facility] for labor and delivery support is not considered a visitor. Doulas  not registered with [facility] are considered visitors.  Mother Baby Unit, OB Specialty and Gynecological Care  A designated support person and three visitors of any age may visit. The three visitors may switch out. The designated support person or a visitor age 1 or older may stay overnight in the room. During the postpartum period (up to 6 weeks), if the mother is the patient, she can have her newborn stay with her if there is another support person present who can be responsible for the baby.      Preparing the Skin Before Surgery     To help prevent the risk of infection at your surgical site, we are now providing you with rinse-free Sage 2% Chlorhexidine Gluconate (CHG) disposable wipes.  Chlorhexidine Gluconate (CHG) Soap  o An antiseptic cleaner that kills germs and bonds with the skin to continue killing germs even after washing  o Used for showering the night before surgery and morning of surgery  The night before surgery: Shower or bathe with warm water. Do not apply perfume, lotions, powders. Wait one hour after shower. Skin should be dry and cool. Open Sage wipe package - use 6 disposable cloths. Wipe body using one cloth for the right arm, one cloth for the left arm, one cloth for the right leg, one cloth for the left leg, one cloth for the chest/abdomen area, and one cloth for the back. Do not use on open wounds or sores. Do not use on face or genitals (private parts). If you are breast feeding, do not use on breasts. 5. Do not rinse, allow to dry. 6. Skin may feel "tacky" for several minutes. 7. Dress in clean clothes. 8. Place clean sheets on your bed and do not sleep with pets.  REPEAT ABOVE ON THE MORNING OF SURGERY BEFORE ARRIVING TO THE HOSPITAL.

## 2023-05-18 NOTE — Addendum Note (Signed)
Addended by: Fabian November on: 05/18/2023 09:06 AM   Modules accepted: Orders

## 2023-05-18 NOTE — Telephone Encounter (Signed)
Patient called this morning after going to pre-admissions.  She stated that they have some information wrong about her procedure on Friday. Will you please give her a call back at [phone].

## 2023-05-19 LAB — RPR: RPR Ser Ql: NONREACTIVE

## 2023-05-19 MED ORDER — LACTATED RINGERS IV SOLN: INTRAVENOUS | Status: DC

## 2023-05-19 MED ORDER — CHLORHEXIDINE GLUCONATE 0.12 % MT SOLN
15.0000 mL | Freq: Once | OROMUCOSAL | Status: AC
  Administered 2023-05-20: 15 mL via OROMUCOSAL
  Filled 2023-05-19: qty 15

## 2023-05-19 MED ORDER — SOD CITRATE-CITRIC ACID 500-334 MG/5ML PO SOLN: 30.0000 mL | ORAL | Status: AC

## 2023-05-19 MED ORDER — ORAL CARE MOUTH RINSE: 15.0000 mL | Freq: Once | OROMUCOSAL | Status: AC

## 2023-05-19 MED ORDER — CEFAZOLIN SODIUM-DEXTROSE 2-4 GM/100ML-% IV SOLN
2.0000 g | INTRAVENOUS | Status: AC
  Administered 2023-05-20: 2 g via INTRAVENOUS
  Filled 2023-05-19: qty 100

## 2023-05-19 MED ORDER — GABAPENTIN 300 MG PO CAPS
300.0000 mg | ORAL_CAPSULE | ORAL | Status: AC
  Administered 2023-05-20: 300 mg via ORAL
  Filled 2023-05-19: qty 1

## 2023-05-19 MED ORDER — ACETAMINOPHEN 500 MG PO TABS
1000.0000 mg | ORAL_TABLET | ORAL | Status: AC
  Administered 2023-05-20: 1000 mg via ORAL
  Filled 2023-05-19: qty 2

## 2023-05-19 MED ORDER — POVIDONE-IODINE 10 % EX SWAB
2.0000 | Freq: Once | CUTANEOUS | Status: AC
  Administered 2023-05-20: 2 via TOPICAL

## 2023-05-20 ENCOUNTER — Encounter
Admission: AD | Disposition: A | Discharge status: Home / Self Care | Payer: Self-pay | Source: Home / Self Care | Attending: Obstetrics and Gynecology

## 2023-05-20 ENCOUNTER — Inpatient Hospital Stay
Admission: AD | Admit: 2023-05-20 | Discharge: 2023-05-22 | DRG: 787 | Disposition: A | Discharge status: Home / Self Care | Payer: BC Managed Care – PPO | Attending: Obstetrics and Gynecology | Admitting: Obstetrics and Gynecology

## 2023-05-20 ENCOUNTER — Inpatient Hospital Stay: Payer: BC Managed Care – PPO | Admitting: Urgent Care

## 2023-05-20 ENCOUNTER — Encounter: Payer: Self-pay | Admitting: Obstetrics and Gynecology

## 2023-05-20 ENCOUNTER — Other Ambulatory Visit: Payer: Self-pay

## 2023-05-20 DIAGNOSIS — O09523 Supervision of elderly multigravida, third trimester: Secondary | ICD-10-CM | POA: Diagnosis not present

## 2023-05-20 DIAGNOSIS — O3413 Maternal care for benign tumor of corpus uteri, third trimester: Secondary | ICD-10-CM | POA: Diagnosis present

## 2023-05-20 DIAGNOSIS — Z6791 Unspecified blood type, Rh negative: Secondary | ICD-10-CM | POA: Diagnosis not present

## 2023-05-20 DIAGNOSIS — D252 Subserosal leiomyoma of uterus: Secondary | ICD-10-CM | POA: Diagnosis present

## 2023-05-20 DIAGNOSIS — O26893 Other specified pregnancy related conditions, third trimester: Secondary | ICD-10-CM | POA: Diagnosis present

## 2023-05-20 DIAGNOSIS — O3429 Maternal care due to uterine scar from other previous surgery: Principal | ICD-10-CM | POA: Diagnosis present

## 2023-05-20 DIAGNOSIS — O10919 Unspecified pre-existing hypertension complicating pregnancy, unspecified trimester: Secondary | ICD-10-CM | POA: Diagnosis present

## 2023-05-20 DIAGNOSIS — Z8249 Family history of ischemic heart disease and other diseases of the circulatory system: Secondary | ICD-10-CM | POA: Diagnosis not present

## 2023-05-20 DIAGNOSIS — O1002 Pre-existing essential hypertension complicating childbirth: Secondary | ICD-10-CM | POA: Diagnosis not present

## 2023-05-20 DIAGNOSIS — Z3A38 38 weeks gestation of pregnancy: Secondary | ICD-10-CM

## 2023-05-20 DIAGNOSIS — O09521 Supervision of elderly multigravida, first trimester: Secondary | ICD-10-CM | POA: Diagnosis present

## 2023-05-20 DIAGNOSIS — D62 Acute posthemorrhagic anemia: Secondary | ICD-10-CM

## 2023-05-20 DIAGNOSIS — Z23 Encounter for immunization: Secondary | ICD-10-CM

## 2023-05-20 DIAGNOSIS — O1092 Unspecified pre-existing hypertension complicating childbirth: Secondary | ICD-10-CM | POA: Diagnosis present

## 2023-05-20 DIAGNOSIS — O9081 Anemia of the puerperium: Secondary | ICD-10-CM | POA: Diagnosis not present

## 2023-05-20 DIAGNOSIS — O360130 Maternal care for anti-D [Rh] antibodies, third trimester, not applicable or unspecified: Secondary | ICD-10-CM | POA: Diagnosis not present

## 2023-05-20 DIAGNOSIS — O9903 Anemia complicating the puerperium: Secondary | ICD-10-CM | POA: Diagnosis not present

## 2023-05-20 DIAGNOSIS — O26899 Other specified pregnancy related conditions, unspecified trimester: Secondary | ICD-10-CM

## 2023-05-20 DIAGNOSIS — R200 Anesthesia of skin: Secondary | ICD-10-CM | POA: Diagnosis not present

## 2023-05-20 DIAGNOSIS — Z98891 History of uterine scar from previous surgery: Secondary | ICD-10-CM

## 2023-05-20 DIAGNOSIS — O09293 Supervision of pregnancy with other poor reproductive or obstetric history, third trimester: Secondary | ICD-10-CM

## 2023-05-20 LAB — SAMPLE TO BLOOD BANK

## 2023-05-20 SURGERY — Surgical Case
Anesthesia: Spinal

## 2023-05-20 MED ORDER — PRENATAL MULTIVITAMIN CH
1.0000 | ORAL_TABLET | Freq: Every day | ORAL | Status: DC
  Administered 2023-05-21 – 2023-05-22 (×2): 1 via ORAL
  Filled 2023-05-20 (×2): qty 1

## 2023-05-20 MED ORDER — LACTATED RINGERS IV BOLUS
500.0000 mL | Freq: Once | INTRAVENOUS | Status: AC
  Administered 2023-05-20: 500 mL via INTRAVENOUS

## 2023-05-20 MED ORDER — SOD CITRATE-CITRIC ACID 500-334 MG/5ML PO SOLN
ORAL | Status: AC
  Administered 2023-05-20: 30 mL via ORAL
  Filled 2023-05-20: qty 15

## 2023-05-20 MED ORDER — LIDOCAINE 5 % EX PTCH
MEDICATED_PATCH | CUTANEOUS | Status: DC | PRN
  Administered 2023-05-20: 1 via TRANSDERMAL

## 2023-05-20 MED ORDER — DIPHENHYDRAMINE HCL 25 MG PO CAPS: 25.0000 mg | ORAL_CAPSULE | ORAL | Status: DC | PRN

## 2023-05-20 MED ORDER — DIPHENHYDRAMINE HCL 50 MG/ML IJ SOLN: 12.5000 mg | INTRAMUSCULAR | Status: DC | PRN

## 2023-05-20 MED ORDER — WITCH HAZEL-GLYCERIN EX PADS: 1.0000 | MEDICATED_PAD | CUTANEOUS | Status: DC | PRN

## 2023-05-20 MED ORDER — IBUPROFEN 600 MG PO TABS: 600.0000 mg | ORAL_TABLET | Freq: Four times a day (QID) | ORAL | Status: DC

## 2023-05-20 MED ORDER — KETOROLAC TROMETHAMINE 30 MG/ML IJ SOLN
30.0000 mg | Freq: Four times a day (QID) | INTRAMUSCULAR | Status: AC
  Administered 2023-05-20 – 2023-05-21 (×4): 30 mg via INTRAVENOUS
  Filled 2023-05-20 (×4): qty 1

## 2023-05-20 MED ORDER — OXYTOCIN-SODIUM CHLORIDE 30-0.9 UT/500ML-% IV SOLN
INTRAVENOUS | Status: DC | PRN
  Administered 2023-05-20: 250 mL via INTRAVENOUS

## 2023-05-20 MED ORDER — OXYCODONE HCL 5 MG PO TABS: 5.0000 mg | ORAL_TABLET | Freq: Once | ORAL | Status: DC | PRN

## 2023-05-20 MED ORDER — OXYCODONE-ACETAMINOPHEN 5-325 MG PO TABS
2.0000 | ORAL_TABLET | ORAL | Status: DC | PRN
  Administered 2023-05-21 – 2023-05-22 (×3): 2 via ORAL
  Filled 2023-05-20 (×3): qty 2

## 2023-05-20 MED ORDER — PHENYLEPHRINE HCL-NACL 20-0.9 MG/250ML-% IV SOLN
INTRAVENOUS | Status: AC
  Filled 2023-05-20: qty 250

## 2023-05-20 MED ORDER — NALOXONE HCL 0.4 MG/ML IJ SOLN: 0.4000 mg | INTRAMUSCULAR | Status: DC | PRN

## 2023-05-20 MED ORDER — FENTANYL CITRATE (PF) 100 MCG/2ML IJ SOLN: 25.0000 ug | INTRAMUSCULAR | Status: DC | PRN

## 2023-05-20 MED ORDER — NALOXONE HCL 4 MG/10ML IJ SOLN: 1.0000 ug/kg/h | INTRAVENOUS | Status: DC | PRN

## 2023-05-20 MED ORDER — ACETAMINOPHEN 325 MG PO TABS: 650.0000 mg | ORAL_TABLET | Freq: Four times a day (QID) | ORAL | Status: DC

## 2023-05-20 MED ORDER — COCONUT OIL OIL: 1.0000 | TOPICAL_OIL | Status: DC | PRN

## 2023-05-20 MED ORDER — MENTHOL 3 MG MT LOZG: 1.0000 | LOZENGE | OROMUCOSAL | Status: DC | PRN

## 2023-05-20 MED ORDER — PHENYLEPHRINE HCL-NACL 20-0.9 MG/250ML-% IV SOLN
INTRAVENOUS | Status: DC | PRN
  Administered 2023-05-20: 50 ug/min via INTRAVENOUS

## 2023-05-20 MED ORDER — FENTANYL CITRATE (PF) 100 MCG/2ML IJ SOLN
INTRAMUSCULAR | Status: DC | PRN
  Administered 2023-05-20: 15 ug via INTRAVENOUS

## 2023-05-20 MED ORDER — OXYCODONE HCL 5 MG PO TABS
5.0000 mg | ORAL_TABLET | ORAL | Status: AC | PRN
  Administered 2023-05-20 – 2023-05-21 (×4): 5 mg via ORAL
  Filled 2023-05-20 (×4): qty 1

## 2023-05-20 MED ORDER — DIBUCAINE (PERIANAL) 1 % EX OINT: 1.0000 | TOPICAL_OINTMENT | CUTANEOUS | Status: DC | PRN

## 2023-05-20 MED ORDER — KETOROLAC TROMETHAMINE 30 MG/ML IJ SOLN
30.0000 mg | Freq: Four times a day (QID) | INTRAMUSCULAR | Status: DC
  Administered 2023-05-20: 30 mg via INTRAVENOUS
  Filled 2023-05-20: qty 1

## 2023-05-20 MED ORDER — SIMETHICONE 80 MG PO CHEW
80.0000 mg | CHEWABLE_TABLET | ORAL | Status: DC | PRN
  Filled 2023-05-20: qty 1

## 2023-05-20 MED ORDER — SODIUM CHLORIDE 0.9% FLUSH: 3.0000 mL | INTRAVENOUS | Status: DC | PRN

## 2023-05-20 MED ORDER — LIDOCAINE 5 % EX PTCH
1.0000 | MEDICATED_PATCH | CUTANEOUS | Status: DC
  Filled 2023-05-20: qty 1

## 2023-05-20 MED ORDER — DIPHENHYDRAMINE HCL 25 MG PO CAPS: 25.0000 mg | ORAL_CAPSULE | Freq: Four times a day (QID) | ORAL | Status: DC | PRN

## 2023-05-20 MED ORDER — TRAMADOL HCL 50 MG PO TABS: 50.0000 mg | ORAL_TABLET | Freq: Four times a day (QID) | ORAL | Status: DC | PRN

## 2023-05-20 MED ORDER — ONDANSETRON HCL 4 MG/2ML IJ SOLN: 4.0000 mg | Freq: Three times a day (TID) | INTRAMUSCULAR | Status: DC | PRN

## 2023-05-20 MED ORDER — OXYTOCIN-SODIUM CHLORIDE 30-0.9 UT/500ML-% IV SOLN
INTRAVENOUS | Status: AC
  Filled 2023-05-20: qty 1000

## 2023-05-20 MED ORDER — 0.9 % SODIUM CHLORIDE (POUR BTL) OPTIME
TOPICAL | Status: DC | PRN
  Administered 2023-05-20: 1000 mL

## 2023-05-20 MED ORDER — ACETAMINOPHEN 500 MG PO TABS
1000.0000 mg | ORAL_TABLET | Freq: Four times a day (QID) | ORAL | Status: DC
  Administered 2023-05-20 – 2023-05-21 (×4): 1000 mg via ORAL
  Filled 2023-05-20 (×6): qty 2

## 2023-05-20 MED ORDER — KETOROLAC TROMETHAMINE 30 MG/ML IJ SOLN: 30.0000 mg | Freq: Four times a day (QID) | INTRAMUSCULAR | Status: DC

## 2023-05-20 MED ORDER — OXYCODONE HCL 5 MG/5ML PO SOLN: 5.0000 mg | Freq: Once | ORAL | Status: DC | PRN

## 2023-05-20 MED ORDER — MORPHINE SULFATE (PF) 0.5 MG/ML IJ SOLN
INTRAMUSCULAR | Status: DC | PRN
  Administered 2023-05-20: 100 ug via EPIDURAL

## 2023-05-20 MED ORDER — SIMETHICONE 80 MG PO CHEW
80.0000 mg | CHEWABLE_TABLET | Freq: Three times a day (TID) | ORAL | Status: DC
  Administered 2023-05-20 – 2023-05-22 (×6): 80 mg via ORAL
  Filled 2023-05-20 (×5): qty 1

## 2023-05-20 MED ORDER — LIDOCAINE HCL (PF) 1 % IJ SOLN
INTRAMUSCULAR | Status: DC | PRN
  Administered 2023-05-20: 2 mL via SUBCUTANEOUS

## 2023-05-20 MED ORDER — BUPIVACAINE IN DEXTROSE 0.75-8.25 % IT SOLN
INTRATHECAL | Status: DC | PRN
  Administered 2023-05-20: 1.4 mL via INTRATHECAL

## 2023-05-20 MED ORDER — SENNOSIDES-DOCUSATE SODIUM 8.6-50 MG PO TABS
2.0000 | ORAL_TABLET | Freq: Every day | ORAL | Status: DC
  Administered 2023-05-21 – 2023-05-22 (×2): 2 via ORAL
  Filled 2023-05-20 (×2): qty 2

## 2023-05-20 MED ORDER — OXYTOCIN-SODIUM CHLORIDE 30-0.9 UT/500ML-% IV SOLN: 2.5000 [IU]/h | INTRAVENOUS | Status: AC

## 2023-05-20 MED ORDER — GABAPENTIN 100 MG PO CAPS
100.0000 mg | ORAL_CAPSULE | Freq: Three times a day (TID) | ORAL | Status: DC
  Administered 2023-05-20 – 2023-05-22 (×6): 100 mg via ORAL
  Filled 2023-05-20 (×6): qty 1

## 2023-05-20 MED ORDER — FENTANYL CITRATE (PF) 100 MCG/2ML IJ SOLN
INTRAMUSCULAR | Status: AC
  Filled 2023-05-20: qty 2

## 2023-05-20 MED ORDER — MORPHINE SULFATE (PF) 0.5 MG/ML IJ SOLN
INTRAMUSCULAR | Status: AC
  Filled 2023-05-20: qty 10

## 2023-05-20 MED ORDER — LIDOCAINE 5 % EX PTCH: 1.0000 | MEDICATED_PATCH | CUTANEOUS | Status: DC

## 2023-05-20 MED ORDER — LACTATED RINGERS IV SOLN: Freq: Once | INTRAVENOUS | Status: AC

## 2023-05-20 MED ORDER — ZOLPIDEM TARTRATE 5 MG PO TABS: 5.0000 mg | ORAL_TABLET | Freq: Every evening | ORAL | Status: DC | PRN

## 2023-05-20 SURGICAL SUPPLY — 42 items
APL PRP STRL LF DISP 70% ISPRP (MISCELLANEOUS) ×2
APL SKNCLS STERI-STRIP NONHPOA (GAUZE/BANDAGES/DRESSINGS) ×1
BAG COUNTER SPONGE SURGICOUNT (BAG) ×1 IMPLANT
BAG SPNG CNTER NS LX DISP (BAG) ×1
BENZOIN TINCTURE PRP APPL 2/3 (GAUZE/BANDAGES/DRESSINGS) IMPLANT
BNDG TENSOPLAST 6X5 (GAUZE/BANDAGES/DRESSINGS) IMPLANT
CHLORAPREP W/TINT 26 (MISCELLANEOUS) ×2 IMPLANT
CLOSURE STERI STRIP 1/2 X4 (GAUZE/BANDAGES/DRESSINGS) IMPLANT
DRESSING SURGICEL FIBRLLR 1X2 (HEMOSTASIS) IMPLANT
DRSG CURAFIL 4X4 STRL (GAUZE/BANDAGES/DRESSINGS) IMPLANT
DRSG SURGICEL FIBRILLAR 1X2 (HEMOSTASIS) ×1
DRSG TELFA 3X8 NADH (GAUZE/BANDAGES/DRESSINGS) ×1 IMPLANT
DRSG TELFA 3X8 NADH STRL (GAUZE/BANDAGES/DRESSINGS) ×1 IMPLANT
ELECT REM PT RETURN 9FT ADLT (ELECTROSURGICAL) ×1
ELECTRODE REM PT RTRN 9FT ADLT (ELECTROSURGICAL) ×1 IMPLANT
EXTRT SYSTEM ALEXIS 17CM (MISCELLANEOUS)
GAUZE CURAFIL 4X4 (GAUZE/BANDAGES/DRESSINGS) IMPLANT
GAUZE SPONGE 4X4 12PLY STRL (GAUZE/BANDAGES/DRESSINGS) ×1 IMPLANT
GLOVE BIO SURGEON STRL SZ 6.5 (GLOVE) ×1 IMPLANT
GLOVE INDICATOR 7.0 STRL GRN (GLOVE) ×1 IMPLANT
GOWN STRL REUS W/ TWL LRG LVL3 (GOWN DISPOSABLE) ×2 IMPLANT
GOWN STRL REUS W/TWL LRG LVL3 (GOWN DISPOSABLE) ×2
KIT TURNOVER KIT A (KITS) ×1 IMPLANT
MANIFOLD NEPTUNE II (INSTRUMENTS) ×1 IMPLANT
MAT PREVALON FULL STRYKER (MISCELLANEOUS) ×1 IMPLANT
NS IRRIG 1000ML POUR BTL (IV SOLUTION) ×1 IMPLANT
PACK C SECTION AR (MISCELLANEOUS) ×1 IMPLANT
PAD DRESSING TELFA 3X8 NADH (GAUZE/BANDAGES/DRESSINGS) IMPLANT
PAD OB MATERNITY 4.3X12.25 (PERSONAL CARE ITEMS) ×1 IMPLANT
PAD PREP OB/GYN DISP 24X41 (PERSONAL CARE ITEMS) ×1 IMPLANT
SCRUB CHG 4% DYNA-HEX 4OZ (MISCELLANEOUS) ×1 IMPLANT
SUT MNCRL 4-0 (SUTURE) ×1
SUT MNCRL 4-018XMFL (SUTURE) ×1
SUT MNCRL AB 4-0 PS2 18 (SUTURE) ×1 IMPLANT
SUT PLAIN 2 0 XLH (SUTURE) IMPLANT
SUT VIC AB 0 CT1 36 (SUTURE) ×4 IMPLANT
SUT VIC AB 3-0 SH 27 (SUTURE) ×1
SUT VIC AB 3-0 SH 27X BRD (SUTURE) ×1 IMPLANT
SUTURE MNCRL 4-018XMF (SUTURE) IMPLANT
SYSTEM CONTND EXTRCTN KII BLLN (MISCELLANEOUS) IMPLANT
TRAP FLUID SMOKE EVACUATOR (MISCELLANEOUS) ×1 IMPLANT
WATER STERILE IRR 500ML POUR (IV SOLUTION) ×1 IMPLANT

## 2023-05-20 NOTE — Op Note (Signed)
Cesarean Section Procedure Note  Indications: patient declines vag del attempt and previous myomectomy  Pre-operative Diagnosis: 38 week 1 day pregnancy, chronic hypertension in pregnancy, advanced maternal age, history of previous myomectomy (no cavity breach), declines vaginal attempt.   Post-operative Diagnosis: Same  Surgeon: Hildred Laser, MD  Assistants:  Guadlupe Spanish, CNM  Procedure: Primary low transverse Cesarean Section with escharotomy  Anesthesia: Spinall anesthesia  Findings: Female infant, cephalic presentation, 2990 grams, with Apgar scores of 8 at one minute and 9 at five minutes. Intact placenta with 3 vessel cord.  Clear amniotic fluid at amniotomy The uterine outline was noted to have several small subserosal fibroids.  Tubes and ovaries appeared normal.   Procedure Details: The patient was seen in the Holding Room. The risks, benefits, complications, treatment options, and expected outcomes were discussed with the patient.  The patient concurred with the proposed plan, giving informed consent.  The site of surgery properly noted/marked. The patient was taken to the Operating Room, identified as Gabriela Thomas and the procedure verified as C-Section Delivery. A Time Out was held and the above information confirmed.  After induction of anesthesia, the patient was draped and prepped in the usual sterile manner. Anesthesia was tested and noted to be adequate. A Pfannenstiel incision was made and carried down through the subcutaneous tissue to the fascia. Fascial incision was made and extended transversely. The fascia was separated from the underlying rectus tissue superiorly and inferiorly. The peritoneum was identified and entered. Peritoneal incision was extended longitudinally. The surgical assist was able to provide retraction to allow for clear visualization of surgical site. An Alexis retractor was placed in the abdomen for additional retraction. The utero-vesical  peritoneal reflection was incised transversely and the bladder flap was bluntly freed from the lower uterine segment. A low transverse uterine incision was made. Delivered from cephalic presentation was a 2990 gram Female with Apgar scores of 8 at one minute and 9 at five minutes.  The assistant was able to apply adequate fundal pressure to allow for successful delivery of the fetus. After the umbilical cord was clamped and cut, cord blood was obtained for evaluation. Delayed cord clamping was observed. The placenta was removed intact and appeared normal. The uterus was not exteriorized due to uterine enlargement, but was cleared of all clots and debris. The uterine outline was noted to have mulitiple small subserosal fibroids on anterior, posterior, and fundal surfaces.  The tubes and ovaries appeared normal.  The uterine incision was closed with running locked sutures of 0-Vicryl.  A second suture of 0-Vicryl was used in an imbricating layer.  Hemostasis was observed. The pericolic gutters were cleared of all clots and debris.   The muscle layer was approximated with 3-0 Vicryl in a figure-of-eight fashion. The fascia was then reapproximated with a running suture of 0-Vicryl. The previous scar was grasped with Allice clamps, tented upward, and excised with the scalpel. Cautery was use to achieve hemostasis of excised scar.  The skin was then reapproximated with 4-0 Monocryl. The incision was covered with steri-strips and a pressure dressing.   Instrument, sponge, and needle counts were correct prior the abdominal closure and at the conclusion of the case.    An experienced assistant was required given the standard of surgical care given the complexity of the case.  This assistant was needed for exposure, dissection, suctioning, retraction, instrument exchange, and for overall help during the procedure.  Estimated Blood Loss:  860 ml  Drains: foley catheter to gravity drainage, 75 ml of clear urine at  end of the procedure         Total IV Fluids: 1000 ml   Specimens: Cord blood          Implants: None         Complications:  None; patient tolerated the procedure well.         Disposition: PACU - hemodynamically stable.         Condition: stable   Hildred Laser, MD  OB/GYN at [facility]

## 2023-05-20 NOTE — H&P (Signed)
Obstetric Preoperative History and Physical  Gabriela Thomas is a 42 y.o. [phone] with IUP at [redacted]w[redacted]d presenting for presenting for scheduled primary cesarean section.  Patient with a history of myomectomy, declines vaginal attempt (although no cavity breach noted during surgery). Pregnancy complicated by AMA status and cHTN in pregnancy (d/c'd meds during second trimester due to hypotension). No acute concerns.   Prenatal Course Source of Care: [facility]  with onset of care at 8 weeks Pregnancy complications or risks: Patient Active Problem List   Diagnosis Date Noted   Maternal chronic hypertension, third trimester 04/15/2023   Chronic hypertension in pregnancy 02/24/2023   Abdominal pain affecting pregnancy, antepartum 02/06/2023   Rh negative state in antepartum period 01/05/2023   Pregnancy with history of uterine myomectomy 11/09/2022   AMA (advanced maternal age) multigravida 35+, first trimester 11/09/2022   Pregnancy, supervision, high-risk 10/25/2022   History of uterine fibroid 04/29/2021   Overweight (BMI 25.0-29.9) 04/29/2021   Essential hypertension 11/19/2018   She plans to breastfeed She desires no method for postpartum contraception.   Prenatal labs and studies: ABO, Rh: --/--/B NEG (05/22 1123) Antibody: POS (05/22 1123) Rubella: 5.26 (11/06 0901) RPR: NON REACTIVE (05/22 1123)  HBsAg: Negative (11/06 0901)  HIV: Non Reactive (03/27 0930)  AVW:UJWJXBJY/-- (05/08 1559) 1 hr Glucola  normal Genetic screening normal Anatomy US normal   Past Medical History:  Diagnosis Date   Anxiety    Childhood asthma    Fibroid    GERD (gastroesophageal reflux disease)    Headache    Hypertension    Macromastia    Maternal chronic hypertension, third trimester    Miscarriage    PONV (postoperative nausea and vomiting)     Past Surgical History:  Procedure Laterality Date   BREAST REDUCTION SURGERY Bilateral 07/14/2020   Procedure: MAMMARY REDUCTION  (BREAST);   Surgeon: Allena Napoleon, MD;  Location: [facility];  Service: Plastics;  Laterality: Bilateral;   LAPAROTOMY N/A 12/12/2018   Procedure: EXPLORATORY LAPAROTOMY, evacuation of hemoperitoneum, aspiration of left adnexal cyst;  Surgeon: Hildred Laser, MD;  Location: [facility];  Service: Gynecology;  Laterality: N/A;   MYOMECTOMY N/A 12/11/2018   Procedure: ABDOMINAL MYOMECTOMY;  Surgeon: Hildred Laser, MD;  Location: [facility];  Service: Gynecology;  Laterality: N/A;    OB History  Gravida Para Term Preterm AB Living  4 1 1   2 1   SAB IAB Ectopic Multiple Live Births  2       1    # Outcome Date GA Lbr Len/2nd Weight Sex Delivery Anes PTL Lv  4 Current           3 SAB 04/10/22     SAB     2 SAB 06/2021     SAB     1 Term 10/15/98 [redacted]w[redacted]d  2410 g F Vag-Spont  N LIV    Social History   Socioeconomic History   Marital status: Single    Spouse name: Not on file   Number of children: 1   Years of education: 16   Highest education level: Not on file  Occupational History   Occupation: Production designer, theatre/television/film at [employer]  Tobacco Use   Smoking status: Never   Smokeless tobacco: Never  Vaping Use   Vaping Use: Never used  Substance and Sexual Activity   Alcohol use: Not Currently    Alcohol/week: 7.0 standard drinks of alcohol    Types: 7 Glasses of wine per week   Drug  use: No   Sexual activity: Yes    Partners: Male    Birth control/protection: Pill  Other Topics Concern   Not on file  Social History Narrative   Not on file   Social Determinants of Health   Financial Resource Strain: Low Risk  (10/25/2022)   Overall Financial Resource Strain (CARDIA)    Difficulty of Paying Living Expenses: Not hard at all  Food Insecurity: No Food Insecurity (10/25/2022)   Hunger Vital Sign    Worried About Running Out of Food in the Last Year: Never true    Ran Out of Food in the Last Year: Never true  Transportation Needs: No Transportation Needs (10/25/2022)   PRAPARE - Therapist, art (Medical): No    Lack of Transportation (Non-Medical): No  Physical Activity: Insufficiently Active (10/25/2022)   Exercise Vital Sign    Days of Exercise per Week: 3 days    Minutes of Exercise per Session: 30 min  Stress: No Stress Concern Present (10/25/2022)   [employer] of Occupational Health - Occupational Stress Questionnaire    Feeling of Stress : Not at all  Social Connections: Moderately Isolated (10/25/2022)   Social Connection and Isolation Panel [NHANES]    Frequency of Communication with Friends and Family: More than three times a week    Frequency of Social Gatherings with Friends and Family: Not on file    Attends Religious Services: Never    Database administrator or Organizations: No    Attends Engineer, structural: Never    Marital Status: Living with partner    Family History  Problem Relation Age of Onset   Hypertension Mother    Diabetes Mother    Congestive Heart Failure Mother    Obesity Mother    Alcohol abuse Father    Hypertension Maternal Grandfather    Hypertension Maternal Aunt    Hypertension Maternal Uncle     No medications prior to admission.    No Known Allergies  Review of Systems: Negative except for what is mentioned in HPI.  Physical Exam: BP 128/84,  Pulse 73,  Weight 180 lbs, Height 5\' 4"  (1.626 m), LMP 08/26/2022 (Exact Date) , Body mass index is 31.03 kg/m.  FHR by Doppler: 135 bpm GENERAL: Well-developed, well-nourished female in no acute distress.  LUNGS: Clear to auscultation bilaterally.  HEART: Regular rate and rhythm. ABDOMEN: Soft, nontender, nondistended, gravid, well-healed Pfannenstiel incision. PELVIC: Deferred EXTREMITIES: Nontender, no edema, 2+ distal pulses.   Pertinent Labs/Studies:   Results for orders placed or performed during the hospital encounter of 05/18/23 (from the past 72 hour(s))  CBC     Status: Abnormal   Collection Time: 05/18/23 11:23 AM  Result  Value Ref Range   WBC 6.1 4.0 - 10.5 K/uL   RBC 3.66 (L) 3.87 - 5.11 MIL/uL   Hemoglobin 11.9 (L) 12.0 - 15.0 g/dL   HCT 09.8 (L) 11.9 - 14.7 %   MCV 96.4 80.0 - 100.0 fL   MCH 32.5 26.0 - 34.0 pg   MCHC 33.7 30.0 - 36.0 g/dL   RDW 82.9 56.2 - 13.0 %   Platelets 164 150 - 400 K/uL   nRBC 0.0 0.0 - 0.2 %    Comment: Performed at [facility], [address] [place], [place] [address]  RPR     Status: None   Collection Time: 05/18/23 11:23 AM  Result Value Ref Range   RPR Ser Ql NON REACTIVE  NON REACTIVE    Comment: Performed at [facility], [address], [place], [place] [address]  Type and screen     Status: None   Collection Time: 05/18/23 11:23 AM  Result Value Ref Range   ABO/RH(D) B NEG    Antibody Screen POS    Sample Expiration 05/21/2023,2359    Extend sample reason PTHF FORM INCOMPLETE, UNABLE TO EXTEND    Antibody Identification      PASSIVELY ACQUIRED ANTI-D Performed at [facility], [address] [place], [place] [address]   Comprehensive metabolic panel per protocol     Status: Abnormal   Collection Time: 05/18/23 11:23 AM  Result Value Ref Range   Sodium 137 135 - 145 mmol/L   Potassium 3.6 3.5 - 5.1 mmol/L   Chloride 108 98 - 111 mmol/L   CO2 20 (L) 22 - 32 mmol/L   Glucose, Bld 102 (H) 70 - 99 mg/dL    Comment: Glucose reference range applies only to samples taken after fasting for at least 8 hours.   BUN 11 6 - 20 mg/dL   Creatinine, Ser 0.98 0.44 - 1.00 mg/dL   Calcium 8.9 8.9 - 11.9 mg/dL   Total Protein 6.3 (L) 6.5 - 8.1 g/dL   Albumin 3.1 (L) 3.5 - 5.0 g/dL   AST 21 15 - 41 U/L   ALT 13 0 - 44 U/L   Alkaline Phosphatase 74 38 - 126 U/L   Total Bilirubin 0.9 0.3 - 1.2 mg/dL   GFR, Estimated >14 >78 mL/min    Comment: (NOTE) Calculated using the CKD-EPI Creatinine Equation (2021)    Anion gap 9 5 - 15    Comment: Performed at [facility], [address] [place], [place] [address]    Assessment and  Plan :KILEA ABDELLATIF is a 42 y.o. [id number] at [redacted]w[redacted]d being admitted  for scheduled cesarean section delivery . The patient is understanding of the planned procedure and is aware of and accepting of all surgical risks, including but not limited to: bleeding which may require transfusion or reoperation; infection which may require antibiotics; injury to bowel, bladder, ureters or other surrounding organs which may require repair; injury to the fetus; need for additional procedures including hysterectomy in the event of life-threatening complications; placental abnormalities wth subsequent pregnancies; incisional problems; blood clot disorders which may require blood thinners;, and other postoperative/anesthesia complications. The patient is in agreement with the proposed plan, and gives informed written consent for the procedure. All questions have been answered.    Hildred Laser, MD [facility] at [facility]

## 2023-05-20 NOTE — Transfer of Care (Signed)
Immediate Anesthesia Transfer of Care Note  Patient: Gabriela Thomas  Procedure(s) Performed: PLTCS (PRIMARY LOW TRANSVERSE C-SECTION)  Patient Location: PACU and L&D  Anesthesia Type:Spinal  Level of Consciousness: awake, alert , and oriented  Airway & Oxygen Therapy: Patient Spontanous Breathing  Post-op Assessment: Report given to RN and Post -op Vital signs reviewed and stable  Post vital signs: Reviewed and stable  Last Vitals:  Vitals Value Taken Time  BP 111/79 05/20/23 0935  Temp    Pulse 88 05/20/23 0935  Resp 20 05/20/23 0935  SpO2 99 % 05/20/23 0935    Last Pain:  Vitals:   05/20/23 0639  TempSrc: Oral         Complications: No notable events documented.

## 2023-05-20 NOTE — Anesthesia Procedure Notes (Signed)
Spinal  Staffing Performed by: Ginger Carne, CRNA Authorized by: Rosaria Ferries, MD

## 2023-05-20 NOTE — Anesthesia Preprocedure Evaluation (Signed)
Anesthesia Evaluation  Patient identified by MRN, date of birth, ID band Patient awake    Reviewed: Allergy & Precautions, NPO status , Patient's Chart, lab work & pertinent test results  History of Anesthesia Complications (+) PONV and history of anesthetic complications  Airway Mallampati: III  TM Distance: >3 FB Neck ROM: full    Dental  (+) Chipped   Pulmonary neg shortness of breath, asthma    Pulmonary exam normal        Cardiovascular Exercise Tolerance: Good hypertension, (-) angina (-) Past MI Normal cardiovascular exam     Neuro/Psych  Headaches PSYCHIATRIC DISORDERS         GI/Hepatic ,GERD  Controlled,,  Endo/Other    Renal/GU   negative genitourinary   Musculoskeletal   Abdominal   Peds  Hematology negative hematology ROS (+)   Anesthesia Other Findings Past Medical History: No date: Anxiety No date: Childhood asthma No date: Fibroid No date: GERD (gastroesophageal reflux disease) No date: Headache No date: Hypertension No date: Macromastia No date: Maternal chronic hypertension, third trimester No date: Miscarriage No date: PONV (postoperative nausea and vomiting)  Past Surgical History: 07/14/2020: BREAST REDUCTION SURGERY; Bilateral     Comment:  Procedure: MAMMARY REDUCTION  (BREAST);  Surgeon: Allena Napoleon, MD;  Location: [facility];                Service: Plastics;  Laterality: Bilateral; 12/12/2018: LAPAROTOMY; N/A     Comment:  Procedure: EXPLORATORY LAPAROTOMY, evacuation of               hemoperitoneum, aspiration of left adnexal cyst;                Surgeon: Hildred Laser, MD;  Location: [facility];                Service: Gynecology;  Laterality: N/A; 12/11/2018: MYOMECTOMY; N/A     Comment:  Procedure: ABDOMINAL MYOMECTOMY;  Surgeon: Hildred Laser, MD;  Location: [facility];  Service: Gynecology;                Laterality: N/A;  BMI     Body Mass Index: 31.03 kg/m      Reproductive/Obstetrics (+) Pregnancy                             Anesthesia Physical Anesthesia Plan  ASA: 3  Anesthesia Plan: Spinal   Post-op Pain Management:    Induction:   PONV Risk Score and Plan:   Airway Management Planned: Natural Airway and Nasal Cannula  Additional Equipment:   Intra-op Plan:   Post-operative Plan:   Informed Consent: I have reviewed the patients History and Physical, chart, labs and discussed the procedure including the risks, benefits and alternatives for the proposed anesthesia with the patient or authorized representative who has indicated his/her understanding and acceptance.     Dental Advisory Given  Plan Discussed with: Anesthesiologist, CRNA and Surgeon  Anesthesia Plan Comments: (Patient reports no bleeding problems and no anticoagulant use.  Plan for spinal with backup GA  Patient consented for risks of anesthesia including but not limited to:  - adverse reactions to medications - damage to eyes, teeth, lips or other oral mucosa - nerve damage due to positioning  - risk of  bleeding, infection and or nerve damage from spinal that could lead to paralysis - risk of headache or failed spinal - damage to teeth, lips or other oral mucosa - sore throat or hoarseness - damage to heart, brain, nerves, lungs, other parts of body or loss of life  Patient voiced understanding.)       Anesthesia Quick Evaluation

## 2023-05-20 NOTE — Anesthesia Procedure Notes (Signed)
Date/Time: 05/20/2023 8:00 AM  Performed by: Ginger Carne, CRNAPre-anesthesia Checklist: Patient identified, Emergency Drugs available, Patient being monitored, Timeout performed and Suction available Patient Re-evaluated:Patient Re-evaluated prior to induction Oxygen Delivery Method: Nasal cannula Preoxygenation: Pre-oxygenation with 100% oxygen Induction Type: IV induction

## 2023-05-20 NOTE — Anesthesia Procedure Notes (Signed)
Spinal  Patient location during procedure: OR Start time: 05/20/2023 7:47 AM End time: 05/20/2023 7:57 AM Reason for block: surgical anesthesia Staffing Performed: anesthesiologist and resident/CRNA  Anesthesiologist: Piscitello, Cleda Mccreedy, MD Resident/CRNA: Ginger Carne, CRNA Performed by: Ginger Carne, CRNA Authorized by: Rosaria Ferries, MD   Preanesthetic Checklist Completed: patient identified, IV checked, site marked, risks and benefits discussed, surgical consent, monitors and equipment checked, pre-op evaluation and timeout performed Spinal Block Patient position: sitting Prep: ChloraPrep Patient monitoring: heart rate, continuous pulse ox, blood pressure and cardiac monitor Approach: midline Location: L3-4 Injection technique: single-shot Needle Needle type: Whitacre and Introducer  Needle gauge: 24 G Needle length: 9 cm Assessment Sensory level: T10 Events: CSF return Additional Notes Sterile aseptic technique used throughout the procedure.  Negative paresthesia. Negative blood return. Positive free-flowing CSF. Expiration date of kit checked and confirmed. Patient tolerated procedure well, without complications. 3 attempts to achieve CSF

## 2023-05-21 LAB — CBC
HCT: 19.6 % — ABNORMAL LOW (ref 36.0–46.0)
Hemoglobin: 6.8 g/dL — ABNORMAL LOW (ref 12.0–15.0)
MCH: 33.7 pg (ref 26.0–34.0)
MCHC: 34.7 g/dL (ref 30.0–36.0)
MCV: 97 fL (ref 80.0–100.0)
Platelets: 104 10*3/uL — ABNORMAL LOW (ref 150–400)
RBC: 2.02 MIL/uL — ABNORMAL LOW (ref 3.87–5.11)
RDW: 13 % (ref 11.5–15.5)
WBC: 7 10*3/uL (ref 4.0–10.5)
nRBC: 0 % (ref 0.0–0.2)

## 2023-05-21 LAB — FETAL SCREEN: Fetal Screen: NEGATIVE

## 2023-05-21 MED ORDER — IBUPROFEN 600 MG PO TABS: 600.0000 mg | ORAL_TABLET | Freq: Four times a day (QID) | ORAL | 3 refills | Status: DC | PRN

## 2023-05-21 MED ORDER — FERROUS SULFATE 325 (65 FE) MG PO TBEC: 325.0000 mg | DELAYED_RELEASE_TABLET | Freq: Every day | ORAL | 0 refills | Status: DC

## 2023-05-21 MED ORDER — IBUPROFEN 600 MG PO TABS
600.0000 mg | ORAL_TABLET | Freq: Four times a day (QID) | ORAL | Status: DC
  Administered 2023-05-21 – 2023-05-22 (×3): 600 mg via ORAL
  Filled 2023-05-21 (×3): qty 1

## 2023-05-21 MED ORDER — OXYCODONE-ACETAMINOPHEN 5-325 MG PO TABS: 1.0000 | ORAL_TABLET | ORAL | 0 refills | Status: DC | PRN

## 2023-05-21 MED ORDER — IRON SUCROSE 500 MG IVPB - SIMPLE MED
500.0000 mg | Freq: Once | INTRAVENOUS | Status: AC
  Administered 2023-05-21: 500 mg via INTRAVENOUS
  Filled 2023-05-21: qty 500

## 2023-05-21 NOTE — Consult Note (Addendum)
Patient reports some numbness in her right hand and left fingertips.  She reports no other neurologic deficits and appears to be otherwise grossly neurologically intact. Patient had an uneventful spinal anesthetic for her C section.   These symptoms are most likely from positioning and should resolve on their own. Plan to have anesthesia team round on the patient again tomorrow. Please contact us or the Neurology team if the patients symptoms worsen or if you have any other concerns about her symptoms.

## 2023-05-21 NOTE — Discharge Summary (Addendum)
Postpartum Discharge Summary  Date of Service updated 05/22/2023     Patient Name: Gabriela Thomas DOB: Jan 22, 1981 MRN: [id number]  Date of admission: 05/20/2023 Delivery date:05/20/2023  Delivering provider: Hildred Laser  Date of discharge: 05/22/2023  Admitting diagnosis: S/P cesarean section [Z98.891] Intrauterine pregnancy: [redacted]w[redacted]d     Secondary diagnosis:  Principal Problem:   Chronic hypertension in pregnancy Active Problems:   Pregnancy with history of uterine myomectomy   AMA (advanced maternal age) multigravida 35+, first trimester   Rh negative state in antepartum period   S/P cesarean section  Additional problems: None    Discharge diagnosis: Term Pregnancy Delivered, CHTN, and Anemia                                              Post partum procedures: iron infusion, rhogam administration Augmentation: N/A Complications: None  Hospital course: Sceduled C/S   42 y.o. yo [id number] at [redacted]w[redacted]d was admitted to the hospital 05/20/2023 for scheduled cesarean section with the following indication:Elective Primary, history of myomectomy. Delivery details are as follows:  Membrane Rupture Time/Date: 8:28 AM ,05/20/2023   Delivery Method:C-Section, Low Transverse  Details of operation can be found in separate operative note.  Patient had a postpartum course complicated by drop in H/H. She received an iron infusion on post op day 1. By post op day 2 she is asymptomatic. Her pain is controlled with PO medication.  She is ambulating, tolerating a regular diet, passing flatus, and urinating well. Her blood pressure continues to be stable/wnl. Patient is discharged home in stable condition on  05/22/23        Newborn Data: Birth date:05/20/2023  Birth time:8:30 AM  Gender:Female  Layla Living status:Living  Apgars:8 ,9  Weight:2990 g     Magnesium Sulfate received: No BMZ received: No Rhophylac:Yes MMR:No T-DaP:Given prenatally Flu: No Transfusion:No  Physical exam  Vitals:    05/21/23 1934 05/21/23 2253 05/22/23 0400 05/22/23 0757  BP: 110/68 127/87 124/77 126/85  Pulse: (!) 106 94 88 98  Resp: 18 18 18 19   Temp: 98.2 F (36.8 C) 98.2 F (36.8 C) 98 F (36.7 C) 97.8 F (36.6 C)  TempSrc: Oral Oral Oral Oral  SpO2: 97% 96% 97% 98%  Weight:      Height:       General: alert, cooperative, and no distress Lochia: appropriate Uterine Fundus: firm Incision: Healing well with no significant drainage, No significant erythema, Dressing is clean, dry, and intact DVT Evaluation: No evidence of DVT seen on physical exam. Negative Homan's sign. No cords or calf tenderness. No significant calf/ankle edema. Labs: Lab Results  Component Value Date   WBC 7.0 05/21/2023   HGB 6.8 (L) 05/21/2023   HCT 19.6 (L) 05/21/2023   MCV 97.0 05/21/2023   PLT 104 (L) 05/21/2023      Latest Ref Rng & Units 05/18/2023   11:23 AM  CMP  Glucose 70 - 99 mg/dL 308   BUN 6 - 20 mg/dL 11   Creatinine 6.57 - 1.00 mg/dL 8.46   Sodium 962 - 952 mmol/L 137   Potassium 3.5 - 5.1 mmol/L 3.6   Chloride 98 - 111 mmol/L 108   CO2 22 - 32 mmol/L 20   Calcium 8.9 - 10.3 mg/dL 8.9   Total Protein 6.5 - 8.1 g/dL 6.3   Total Bilirubin 0.3 -  1.2 mg/dL 0.9   Alkaline Phos 38 - 126 U/L 74   AST 15 - 41 U/L 21   ALT 0 - 44 U/L 13    Edinburgh Score:    05/22/2023    5:39 AM  Edinburgh Postnatal Depression Scale Screening Tool  I have been able to laugh and see the funny side of things. 0  I have looked forward with enjoyment to things. 0  I have blamed myself unnecessarily when things went wrong. 0  I have been anxious or worried for no good reason. 0  I have felt scared or panicky for no good reason. 0  Things have been getting on top of me. 0  I have been so unhappy that I have had difficulty sleeping. 0  I have felt sad or miserable. 0  I have been so unhappy that I have been crying. 0  The thought of harming myself has occurred to me. 0  Edinburgh Postnatal Depression Scale  Total 0      After visit meds:  Allergies as of 05/22/2023   No Known Allergies      Medication List     STOP taking these medications    aspirin 81 MG chewable tablet       TAKE these medications    docusate sodium 100 MG capsule Commonly known as: COLACE Take 100 mg by mouth 2 (two) times daily.   ferrous sulfate 325 (65 FE) MG EC tablet Take 1 tablet (325 mg total) by mouth daily with breakfast.   folic acid 800 MCG tablet Commonly known as: FOLVITE Take 800 mcg by mouth daily.   ibuprofen 600 MG tablet Commonly known as: ADVIL Take 1 tablet (600 mg total) by mouth every 6 (six) hours as needed.   multivitamin-prenatal 27-0.8 MG Tabs tablet Take 1 tablet by mouth daily at 12 noon.   oxyCODONE-acetaminophen 5-325 MG tablet Commonly known as: PERCOCET/ROXICET Take 1-2 tablets by mouth every 4 (four) hours as needed for severe pain ((when tolerating fluids)).   PEPCID PO Take 20 mg by mouth daily.               Discharge Care Instructions  (From admission, onward)           Start     Ordered   05/22/23 0000  Discharge wound care:       Comments: Keep incision dry, clean.   05/22/23 1009             Discharge home in stable condition Infant Feeding: Formula Infant Disposition:home with mother Discharge instruction: per After Visit Summary and Postpartum booklet. Activity: Advance as tolerated. Pelvic rest for 6 weeks.  Diet: routine diet Anticipated Birth Control: Non-hormonal, plans pregnancy in next 6 months Postpartum Appointment:6 weeks Additional Postpartum F/U: Incision check 1 week and BP check 1 week Future Appointments: Future Appointments  Date Time Provider Department Center  05/26/2023  4:15 PM Hildred Laser, MD [facility] None   Follow up Visit:  Follow-up Information     [facility] at [facility] Follow up.   Specialty: Obstetrics and Gynecology Why: 10 day incision check/mood check with Dr.  Valentino Saxon 6 week postpartum visit Contact information: [address] [place] [address] [phone]                  05/22/2023 Tresea Mall, CNM

## 2023-05-21 NOTE — Progress Notes (Addendum)
Postpartum Day # 1: Cesarean Delivery (primary).  AMA, CHTN (no meds), h/o myomectomy, Rh negative.    Subjective: Patient reports tolerating PO.  Has not yet ambulated or passed flatus. Had decreased urine output overnight but improved with IVF.  Breastfeeding without difficulties. Bleeding is normal. Complains of numbness in right hand and fingertips, has been intermittent since surgery. Does also have a history of carpal tunnel.  No other issues.   Objective: Vital signs in last 24 hours: Temp:  [97.8 F (36.6 C)-98.5 F (36.9 C)] 98.1 F (36.7 C) (05/25 0732) Pulse Rate:  [65-99] 95 (05/25 0732) Resp:  [18-20] 20 (05/25 0732) BP: (95-123)/(56-91) 116/76 (05/25 0732) SpO2:  [88 %-100 %] 98 % (05/25 0732)  Physical Exam:  General: alert and no distress Lungs: clear to auscultation bilaterally Breasts: normal appearance, no masses or tenderness Heart: regular rate and rhythm, S1, S2 normal, no murmur, click, rub or gallop Abdomen: soft, non-tender; bowel sounds normal; no masses,  no organomegaly Pelvis: Lochia appropriate, Uterine Fundus firm, Incision: Bandage moderately soaked with old blood. Otherwise healing well, no significant drainage, no dehiscence, no significant erythema Extremities: DVT Evaluation: No evidence of DVT seen on physical exam. Negative Homan's sign. No cords or calf tenderness. No significant calf/ankle edema.    Recent Labs    05/18/23 1123 05/21/23 0616  HGB 11.9* 6.8*  HCT 35.3* 19.6*    Assessment/Plan: - Status post Cesarean section.  - Breastfeeding, Lactation consult, if needed.  - Contraception : none - Regular diet as tolerated.  - Continue PO pain management - Encourage ambulation.  - Discussed postpartum anemia, likely secondary to surgical blood loss. EBL was 860 ml, however concerns for greater surgical loss based on Hgb.  Patient currently without any concerns of excessive uterine bleeding or incisional bleeding. Overall appears  stable and asymptomatic. Discussed possibility of requiring blood transfusion if symptomatic once ambulating. Otherwise, will consider iron infusion.  - Numbness and tingling intermittently in right hand, patient notes she will have someone bring her hand brace from home. May also be due to positioning while sleeping.  - Abdominal dressing removed, Dermabond placed over the incision. - CHTN, no meds  currently. BPs remain stable at this time.  - Rh negative, for Rhogam postpartum     - Continue current care.  Hildred Laser, MD [facility] at [facility]

## 2023-05-21 NOTE — Lactation Note (Signed)
This note was copied from a baby's chart. Lactation Consultation Note  Patient Name: Gabriela Thomas [id number] Date: 05/21/2023 Age:42 hours   Mom indicated on admission breastfeeding was her feeding choice for baby. Mom has been exclusively bottle feeding. LC introduced herself to mom today at 1st [facility] visit.  Per mom she will call LC if she needs any assistance. Per pediatric Provider note mom is formula feeding secondary to history of breast reduction and inadequate supply with previous child.   Care nurse updated.   Fuller Song 05/21/2023, 2:33 PM

## 2023-05-22 MED ORDER — RHO D IMMUNE GLOBULIN 1500 UNIT/2ML IJ SOSY
300.0000 ug | PREFILLED_SYRINGE | Freq: Once | INTRAMUSCULAR | Status: AC
  Administered 2023-05-22: 300 ug via INTRAVENOUS
  Filled 2023-05-22: qty 2

## 2023-05-22 NOTE — Discharge Instructions (Signed)

## 2023-05-22 NOTE — Progress Notes (Signed)
Patient discharged home with family.  Discharge instructions, when to follow up, and prescriptions reviewed with patient.  Patient verbalized understanding. Patient will be escorted out by auxiliary.   

## 2023-05-22 NOTE — Anesthesia Postprocedure Evaluation (Signed)
Anesthesia Post Note  Patient: Gabriela Thomas  Procedure(s) Performed: PLTCS (PRIMARY LOW TRANSVERSE C-SECTION)  Patient location during evaluation: PACU Anesthesia Type: Spinal Level of consciousness: awake and alert, oriented and patient cooperative Pain management: pain level controlled Vital Signs Assessment: post-procedure vital signs reviewed and stable Respiratory status: spontaneous breathing, nonlabored ventilation and respiratory function stable Cardiovascular status: blood pressure returned to baseline and stable Postop Assessment: adequate PO intake, no headache, no backache and able to ambulate Anesthetic complications: no Comments: Patient reports that the numbness in her hand is "almost all gone," and she "forgot about it overnight."  Recommended patient tell OB or PCP if numbness doesn't completely resolve, or returns.  She understands and agrees with plan.  She is looking forward to discharge home today.   No notable events documented.   Last Vitals:  Vitals:   05/21/23 2253 05/22/23 0400  BP: 127/87 124/77  Pulse: 94 88  Resp: 18 18  Temp: 36.8 C 36.7 C  SpO2: 96% 97%    Last Pain:  Vitals:   05/22/23 0446  TempSrc:   PainSc: 2                  Reed Breech

## 2023-05-22 NOTE — Progress Notes (Signed)
Rhogam given

## 2023-05-23 ENCOUNTER — Inpatient Hospital Stay: Admission: RE | Admit: 2023-05-23 | Payer: BC Managed Care – PPO | Source: Ambulatory Visit

## 2023-05-23 LAB — RHOGAM INJECTION: Unit division: 0

## 2023-05-24 ENCOUNTER — Encounter: Payer: Self-pay | Admitting: Obstetrics and Gynecology

## 2023-05-24 ENCOUNTER — Telehealth: Payer: Self-pay

## 2023-05-24 NOTE — Telephone Encounter (Signed)
Pt calling; is bleeding from her incision.  [phone]

## 2023-05-26 ENCOUNTER — Ambulatory Visit (INDEPENDENT_AMBULATORY_CARE_PROVIDER_SITE_OTHER): Payer: BC Managed Care – PPO | Admitting: Obstetrics and Gynecology

## 2023-05-26 ENCOUNTER — Encounter: Payer: Self-pay | Admitting: Obstetrics and Gynecology

## 2023-05-26 VITALS — BP 147/96 | HR 79 | Ht 64.5 in | Wt 172.9 lb

## 2023-05-26 DIAGNOSIS — K5903 Drug induced constipation: Secondary | ICD-10-CM

## 2023-05-26 DIAGNOSIS — Z98891 History of uterine scar from previous surgery: Secondary | ICD-10-CM

## 2023-05-26 DIAGNOSIS — I10 Essential (primary) hypertension: Secondary | ICD-10-CM

## 2023-05-26 DIAGNOSIS — Z4889 Encounter for other specified surgical aftercare: Secondary | ICD-10-CM

## 2023-05-26 MED ORDER — NIFEDIPINE ER OSMOTIC RELEASE 30 MG PO TB24
30.0000 mg | ORAL_TABLET | Freq: Every day | ORAL | 3 refills | Status: DC
Start: 2023-05-26 — End: 2024-09-06

## 2023-05-26 MED ORDER — GABAPENTIN 100 MG PO CAPS: 100.0000 mg | ORAL_CAPSULE | Freq: Two times a day (BID) | ORAL | 0 refills | Status: DC

## 2023-05-26 MED ORDER — FUROSEMIDE 20 MG PO TABS: 20.0000 mg | ORAL_TABLET | Freq: Every day | ORAL | 0 refills | Status: DC

## 2023-05-26 MED ORDER — TRAMADOL HCL 50 MG PO TABS: 50.0000 mg | ORAL_TABLET | Freq: Four times a day (QID) | ORAL | 0 refills | Status: DC | PRN

## 2023-05-26 NOTE — Progress Notes (Signed)
    OBSTETRICS/GYNECOLOGY POST-OPERATIVE CLINIC VISIT  Subjective:     Gabriela Thomas is a 42 y.o. [phone] female who presents to the clinic 1 weeks status post  PLTCS Primary Low transverse C-section  for History of myomectomy, cHTN in pregnancy, advanced maternal age . Eating a regular diet without difficulty. Bowel movements are abnormal with constipation . Pain is controlled with current analgesics. Medications being used: ibuprofen (OTC) and narcotic analgesics including oxycodone/acetaminophen (Percocet, Tylox). She reports that she has been having swelling in her lower extremities, headaches and her blood pressure has been elevated. Notes bloody drainage from her incision several days ago, light to moderate.   The following portions of the patient's history were reviewed and updated as appropriate: allergies, current medications, past family history, past medical history, past social history, past surgical history, and problem list.  Review of Systems Pertinent items noted in HPI and remainder of comprehensive ROS otherwise negative.   Objective:   BP (!) 147/96   Pulse 79   Ht 5' 4.5" (1.638 m)   Wt 172 lb 14.4 oz (78.4 kg)   LMP 08/26/2022 (Exact Date)   BMI 29.22 kg/m  Body mass index is 29.22 kg/m.  General:  alert and no distress  Abdomen: soft, bowel sounds active, non-tender  Incision:   healing well, no erythema, no hernia, no seroma, no swelling, no dehiscence, incision well approximated. Small amount of bloody drainage expressed from left corner and midline of incision.   Extremities:  Trace edema noted in lower extremities bilaterally, nontender     Assessment:   Patient s/p PLTCS Primary Low Transverse C-section (surgery)  Constipation cHTN    Plan:   1. Continue any current medications as instructed by provider. Patient desires something more for pain, will prescribe Tramadol as Percocet is running out. Also prescribed Gabpentin for "nerve pain"" felt at  incision  p 2. Wound care discussed.  Currently with Dermabond in place with small amount of oozing. Can rev 3. Constipation: Continue to use stool softeners, and can use Miralax, prune juice. Potentially decreasing narcotic use may help constipation.  4. Activity restrictions: no bending, stooping, or squatting, no lifting more than 10-15 pounds, and pelvic rest x 5 weeks.  5. cHTN with leg swelling (mild). Will restart Procardia 30 mg daily, and prescribe Lasix for leg swelling (up to 3 days of use).  Anticipated return to work:  8-12 weeks . 6. Follow up:  1 week  for PP mood check. Can also reassess BPs at that time.     Hildred Laser, MD [facility]

## 2023-05-27 ENCOUNTER — Other Ambulatory Visit: Payer: Self-pay | Admitting: Obstetrics and Gynecology

## 2023-06-02 ENCOUNTER — Encounter: Payer: Self-pay | Admitting: Obstetrics and Gynecology

## 2023-06-02 NOTE — Progress Notes (Signed)
Virtual Visit via Video Note  I connected with [name] on 06/02/23 at   1:55 PM EDT by a video enabled telemedicine application and verified that I am speaking with the correct person using two identifiers.  Location: Patient: Home Provider: Office   I discussed the limitations of evaluation and management by telemedicine and the availability of in person appointments. The patient expressed understanding and agreed to proceed.    History of Present Illness:   MURLEAN Thomas is a 42 y.o. [phone] female who presents for a 2 week televisit for mood check. She is 2 weeks postpartum following a PLTCS Primary Low transverse cesarean delivery.  The delivery was at 38 gestational weeks.  Pregnancy was complicated by Moishe Spice, AMA. Postpartum course has been well so far. Baby is feeding by bottle with Bobbie formula. Also is breast pumping. Bleeding: light. Postpartum depression screening: negative.  EDPS score is 0. She does note that her incision was still having some slight bloody drainage but not as bad as last week.       The following portions of the patient's history were reviewed and updated as appropriate: allergies, current medications, past family history, past medical history, past social history, past surgical history, and problem list.   Observations/Objective:   Last menstrual period 08/26/2022, unknown if currently breastfeeding. Height 5'4.  Wt Readings from Last 3 Encounters:  05/26/23 172 lb 14.4 oz (78.4 kg)  05/20/23 180 lb 12.4 oz (82 kg)  05/13/23 180 lb (81.6 kg)    Gen App: NAD Psych: normal speech, affect. Good mood.        05/26/2023    4:25 PM 05/22/2023    5:39 AM 10/25/2022    8:39 AM  Edinburgh Postnatal Depression Scale Screening Tool  I have been able to laugh and see the funny side of things. 0 0 0  I have looked forward with enjoyment to things. 0 0 0  I have blamed myself unnecessarily when things went wrong. 0 0 0  I have been anxious  or worried for no good reason. 0 0 0  I have felt scared or panicky for no good reason. 0 0 0  Things have been getting on top of me. 0 0 0  I have been so unhappy that I have had difficulty sleeping. 0 0 0  I have felt sad or miserable. 0 0 0  I have been so unhappy that I have been crying. 0 0 0  The thought of harming myself has occurred to me. 0 0 0  Edinburgh Postnatal Depression Scale Total 0 0 0         Assessment and Plan:   1. Encounter for screening for maternal depression - Recent negative depression screen. Will rescreen at 6 week postpartum visit. Overall doing well.    2. Postpartum state - Overall doing well. Continue routine postpartum home care.  - S/p cesarean section, still noting some scant bloody drainage from left side of incision but is improving.    3. Lactating mother - Patient currently pumping, and formul feeding. Notes she was afraid to utilize breast milk due to some of the medications she was on and not having a good milk supply initially. Now milk has come in. Advised that she was fine to breastfeed on current meds.    4. cTHN   - Procardia as prescribed.   Follow Up Instructions:     I discussed the assessment and treatment plan with  the patient. The patient was provided an opportunity to ask questions and all were answered. The patient agreed with the plan and demonstrated an understanding of the instructions.   The patient was advised to call back or seek an in-person evaluation if the symptoms worsen or if the condition fails to improve as anticipated.   I provided 6 minutes of non-face-to-face time during this encounter.     Hildred Laser, MD [facility]

## 2023-06-07 ENCOUNTER — Encounter: Payer: BC Managed Care – PPO | Admitting: Obstetrics and Gynecology

## 2023-06-07 ENCOUNTER — Other Ambulatory Visit: Payer: Self-pay | Admitting: Obstetrics and Gynecology

## 2023-06-07 ENCOUNTER — Ambulatory Visit (INDEPENDENT_AMBULATORY_CARE_PROVIDER_SITE_OTHER): Payer: BC Managed Care – PPO | Admitting: Obstetrics and Gynecology

## 2023-06-07 ENCOUNTER — Encounter: Payer: Self-pay | Admitting: Obstetrics and Gynecology

## 2023-06-07 DIAGNOSIS — Z1332 Encounter for screening for maternal depression: Secondary | ICD-10-CM

## 2023-06-07 DIAGNOSIS — Z98891 History of uterine scar from previous surgery: Secondary | ICD-10-CM

## 2023-06-07 DIAGNOSIS — I10 Essential (primary) hypertension: Secondary | ICD-10-CM

## 2023-07-05 ENCOUNTER — Encounter: Payer: Self-pay | Admitting: Obstetrics and Gynecology

## 2023-07-05 ENCOUNTER — Ambulatory Visit (INDEPENDENT_AMBULATORY_CARE_PROVIDER_SITE_OTHER): Payer: BC Managed Care – PPO | Admitting: Obstetrics and Gynecology

## 2023-07-05 DIAGNOSIS — O9081 Anemia of the puerperium: Secondary | ICD-10-CM

## 2023-07-05 DIAGNOSIS — I10 Essential (primary) hypertension: Secondary | ICD-10-CM | POA: Diagnosis not present

## 2023-07-05 NOTE — Patient Instructions (Signed)

## 2023-07-05 NOTE — Progress Notes (Signed)
OBSTETRICS POSTPARTUM CLINIC PROGRESS NOTE  Subjective:     Gabriela Thomas is a 42 y.o. [phone] female who presents for a postpartum visit. She is 6 weeks postpartum following a low cervical transverse Cesarean section. I have fully reviewed the prenatal and intrapartum course. The delivery was at 38.1 gestational weeks.  Anesthesia: spinal. Postpartum course has been going well. Baby's course has been good. Baby is feeding via bottle - Bobbie (natural formula) . Bleeding: patient has resumed menses, with Patient's last menstrual period was 06/22/2023 (exact date).. Bowel function is normal. Bladder function is normal. Patient is not sexually active. Contraception method desired is none. Postpartum depression screening: negative.  EDPS score is 1.    The following portions of the patient's history were reviewed and updated as appropriate: allergies, current medications, past family history, past medical history, past social history, past surgical history, and problem list.  Review of Systems Pertinent items are noted in HPI.   Objective:    BP (!) 149/102   Pulse 81   Resp 16   Ht 5' 4.5" (1.638 m)   Wt 162 lb 14.4 oz (73.9 kg)   LMP 06/22/2023 (Exact Date)   BMI 27.53 kg/m   General:  alert and no distress   Breasts:  inspection negative, no nipple discharge or bleeding, no masses or nodularity palpable  Lungs: clear to auscultation bilaterally  Heart:  regular rate and rhythm, S1, S2 normal, no murmur, click, rub or gallop  Abdomen: soft, non-tender; bowel sounds normal; no masses,  no organomegaly.  Well healed Pfannenstiel incision   Pelvis:  deferred  Neurologic:  Grossly intact            07/05/2023   10:32 AM 05/26/2023    4:25 PM 05/22/2023    5:39 AM 10/25/2022    8:39 AM  Edinburgh Postnatal Depression Scale Screening Tool  I have been able to laugh and see the funny side of things. 0 0 0 0  I have looked forward with enjoyment to things. 0 0 0 0  I have blamed  myself unnecessarily when things went wrong. 0 0 0 0  I have been anxious or worried for no good reason. 1 0 0 0  I have felt scared or panicky for no good reason. 0 0 0 0  Things have been getting on top of me. 0 0 0 0  I have been so unhappy that I have had difficulty sleeping. 0 0 0 0  I have felt sad or miserable. 0 0 0 0  I have been so unhappy that I have been crying. 0 0 0 0  The thought of harming myself has occurred to me. 0 0 0 0  Edinburgh Postnatal Depression Scale Total 1 0 0 0     Labs:  Lab Results  Component Value Date   HGB 6.8 (L) 05/21/2023     Assessment:   1. Postpartum care following cesarean delivery   2. Essential hypertension   3. Postpartum anemia      Plan:   1. Contraception: none. Has h/o subfertility. Plans to attempt conception again within 6 months.  2. Postpartum anemia, asymptomatic. Likely improved with PO iron.  3. CHTN, still mildly elevated.  Advised to increase BP meds from 30 mg daily to 60 mg daily.  Prior to pregnancy was on 90 mg.  4. Follow up in:  4-6  months or as needed.      Hildred Laser, MD [facility]

## 2023-07-18 ENCOUNTER — Encounter: Payer: Self-pay | Admitting: Obstetrics and Gynecology

## 2023-07-19 ENCOUNTER — Encounter: Payer: Self-pay | Admitting: Obstetrics and Gynecology

## 2023-07-19 ENCOUNTER — Telehealth (INDEPENDENT_AMBULATORY_CARE_PROVIDER_SITE_OTHER): Payer: BC Managed Care – PPO | Admitting: Obstetrics and Gynecology

## 2023-07-19 VITALS — Ht 64.5 in | Wt 164.0 lb

## 2023-07-19 DIAGNOSIS — F418 Other specified anxiety disorders: Secondary | ICD-10-CM

## 2023-07-19 DIAGNOSIS — F4321 Adjustment disorder with depressed mood: Secondary | ICD-10-CM

## 2023-07-19 DIAGNOSIS — O99345 Other mental disorders complicating the puerperium: Secondary | ICD-10-CM

## 2023-07-19 NOTE — Patient Instructions (Signed)
Postpartum Baby Blues The postpartum period begins right after the birth of a baby. During this time, there is often joy and excitement. It is also a time of many changes in the life of the parents. A mother may feel happy one minute and sad or stressed the next. These feelings of sadness, called the baby blues, usually happen in the period right after the baby is born and go away within a week or two. What are the causes? The exact cause of this condition is not known. Changes in hormone levels after childbirth are believed to trigger some of the symptoms. Other factors that can play a role in these mood changes include: Lack of sleep. Stressful life events, such as financial problems, caring for a loved one, or death of a loved one. Genetics. What are the signs or symptoms? Symptoms of this condition include: Changes in mood, such as going from extreme happiness to sadness. A decrease in concentration. Difficulty sleeping. Crying spells and tearfulness. Loss of appetite. Irritability. Anxiety. If these symptoms last for more than 2 weeks or become more severe, you may have postpartum depression. How is this diagnosed? This condition is diagnosed based on an evaluation of your symptoms. Your health care provider may use a screening tool that includes a list of questions to help identify a person with the baby blues or postpartum depression. How is this treated? The baby blues usually go away on their own in 1-2 weeks. Social support is often what is needed. You will be encouraged to get adequate sleep and rest. Follow these instructions at home: Lifestyle     Get as much rest as you can. Take a nap when the baby sleeps. Exercise regularly as told by your health care provider. Some women find yoga and walking to be helpful. Eat a balanced and nourishing diet. This includes plenty of fruits and vegetables, whole grains, and lean proteins. Do little things that you enjoy. Take a bubble  bath, read your favorite magazine, or listen to your favorite music. Avoid alcohol. Ask for help with household chores, cooking, grocery shopping, or running errands. Do not try to do everything yourself. Consider hiring a postpartum doula to help. This is a professional who specializes in providing support to new mothers. Try not to make any major life changes during pregnancy or right after giving birth. This can add stress. General instructions Talk to people close to you about how you are feeling. Get support from your partner, family members, friends, or other new moms. You may want to join a support group. Find ways to manage stress. This may include: Writing your thoughts and feelings in a journal. Spending time outside. Spending time with people who make you laugh. Try to stay positive in how you think. Think about the things you are grateful for. Take over-the-counter and prescription medicines only as told by your health care provider. Let your health care provider know if you have any concerns. Keep all postpartum visits. This is important. Contact a health care provider if: Your baby blues do not go away after 2 weeks. Get help right away if: You have thoughts of taking your own life (suicidal thoughts), or of harming your baby or someone else. You see or hear things that are not there (hallucinations). If you ever feel like you may hurt yourself or others, or have thoughts about taking your own life, get help right away. Go to your nearest emergency department or: Call your local emergency services (911  in the [place] Call a suicide crisis helpline, such as the National Suicide Prevention Lifeline, at 2010651762 or 988 in the [place] This is open 24 hours a day in the [place] Text the Crisis Text Line at 516-247-7780 (in the [place] Summary After giving birth, you may feel happy one minute and sad or stressed the next. Feelings of sadness that happen right after the baby is born and go  away after a week or two are called the baby blues. You can manage the baby blues by getting enough rest, eating a healthy diet, exercising, spending time with supportive people, and finding ways to manage stress. If feelings of sadness and stress last longer than 2 weeks or get in the way of caring for your baby, talk with your health care provider. This may mean you have postpartum depression. This information is not intended to replace advice given to you by your health care provider. Make sure you discuss any questions you have with your health care provider. Document Revised: 07/08/2021 Document Reviewed: 06/06/2020 Elsevier Patient Education  2024 [employer].

## 2023-07-19 NOTE — Progress Notes (Signed)
Virtual Visit via Video Note  I connected with Gabriela Thomas on 07/19/23 at   4:15 PM EDT by a video enabled telemedicine application and verified that I am speaking with the correct person using two identifiers. A portion of the visit had to be done by telephone due to technical difficulties with video.   Location: Patient: Home Provider: Office   I discussed the limitations of evaluation and management by telemedicine and the availability of in person appointments. The patient expressed understanding and agreed to proceed.    History of Present Illness:   Gabriela Thomas is a 42 y.o. [phone] female who presents for a televisit for mood check. Notes that her grandmother passed recently.  Is dealing with the emotions of that. Also is having anxiety about separation later once she returns to work and her child has to go to daycare.  Notes that this is consuming her thoughts. Is fearful if anyone other than her daughter or husband are watching her newborn. Also notes some family discord along her husband's side of the family. States that she does not have as much support on her side as her mother passed away years ago and now her grandmother has passed. She is ~ 8 weeks postpartum following a low transverse cesarean delivery.       The following portions of the patient's history were reviewed and updated as appropriate: allergies, current medications, past family history, past medical history, past social history, past surgical history, and problem list.   Observations/Objective:   Height 5' 4.5" (1.638 m), weight 164 lb (74.4 kg), last menstrual period 06/22/2023, not currently breastfeeding. Gen App: NAD Psych: normal speech, affect. Good mood.        07/19/2023    4:03 PM 07/05/2023   10:32 AM 05/26/2023    4:25 PM 05/22/2023    5:39 AM 10/25/2022    8:39 AM  Edinburgh Postnatal Depression Scale Screening Tool  I have been able to laugh and see the funny side of things. 0 0 0 0  0  I have looked forward with enjoyment to things. 0 0 0 0 0  I have blamed myself unnecessarily when things went wrong. 0 0 0 0 0  I have been anxious or worried for no good reason. 2 1 0 0 0  I have felt scared or panicky for no good reason. 2 0 0 0 0  Things have been getting on top of me. 0 0 0 0 0  I have been so unhappy that I have had difficulty sleeping. 0 0 0 0 0  I have felt sad or miserable. 1 0 0 0 0  I have been so unhappy that I have been crying. 0 0 0 0 0  The thought of harming myself has occurred to me. 0 0 0 0 0  Edinburgh Postnatal Depression Scale Total 5 1 0 0 0         Assessment and Plan:   Postpartum anxiety - discussion had on symptoms, most likely dealing with anxiety (and grief) rather than depression. Discussed counseling, use of natural herbal supplements (I.e. Ashwaganda, Sam-E), or use of prescription medications. Patient notes she will try counseling, will look into herbal supplements. Also encouraged to join mommy support groups to feel a sense of comradery during this postpartum time.  Lastly discussed other ways to ease burden of childcare after resuming work, including alternating work shifts, working from home if available. GAD initially not completed during visit,  sent via MyChart to complete.  Grief - patient appears to be appropriately displaying grief after death of her grandmother.  Is planning on seeking counseling.      Follow Up Instructions:   To follow up in 4-6 weeks after counseling initiated. To f/u sooner if symptoms worsen.    I discussed the assessment and treatment plan with the patient. The patient was provided an opportunity to ask questions and all were answered. The patient agreed with the plan and demonstrated an understanding of the instructions.   The patient was advised to call back or seek an in-person evaluation if the symptoms worsen or if the condition fails to improve as anticipated.   I provided 16 minutes of  non-face-to-face time during this encounter.     Hildred Laser, MD [facility]

## 2023-12-10 ENCOUNTER — Other Ambulatory Visit: Payer: Self-pay

## 2023-12-10 ENCOUNTER — Emergency Department
Admission: EM | Admit: 2023-12-10 | Discharge: 2023-12-10 | Disposition: A | Discharge status: Home / Self Care | Payer: BC Managed Care – PPO | Attending: Emergency Medicine | Admitting: Emergency Medicine

## 2023-12-10 ENCOUNTER — Emergency Department: Payer: BC Managed Care – PPO

## 2023-12-10 DIAGNOSIS — Z1152 Encounter for screening for COVID-19: Secondary | ICD-10-CM | POA: Diagnosis not present

## 2023-12-10 DIAGNOSIS — O4690 Antepartum hemorrhage, unspecified, unspecified trimester: Secondary | ICD-10-CM

## 2023-12-10 DIAGNOSIS — Z3A01 Less than 8 weeks gestation of pregnancy: Secondary | ICD-10-CM | POA: Insufficient documentation

## 2023-12-10 DIAGNOSIS — O26891 Other specified pregnancy related conditions, first trimester: Secondary | ICD-10-CM | POA: Diagnosis not present

## 2023-12-10 DIAGNOSIS — O208 Other hemorrhage in early pregnancy: Secondary | ICD-10-CM | POA: Insufficient documentation

## 2023-12-10 DIAGNOSIS — O3680X0 Pregnancy with inconclusive fetal viability, not applicable or unspecified: Secondary | ICD-10-CM

## 2023-12-10 DIAGNOSIS — I10 Essential (primary) hypertension: Secondary | ICD-10-CM | POA: Diagnosis not present

## 2023-12-10 LAB — BASIC METABOLIC PANEL
Anion gap: 7 (ref 5–15)
BUN: 15 mg/dL (ref 6–20)
CO2: 25 mmol/L (ref 22–32)
Calcium: 9.1 mg/dL (ref 8.9–10.3)
Chloride: 105 mmol/L (ref 98–111)
Creatinine, Ser: 0.91 mg/dL (ref 0.44–1.00)
GFR, Estimated: >60 mL/min (ref >60)
Glucose, Bld: 101 mg/dL — ABNORMAL HIGH (ref 70–99)
Potassium: 3.7 mmol/L (ref 3.5–5.1)
Sodium: 137 mmol/L (ref 135–145)

## 2023-12-10 LAB — CBC
HCT: 38.9 % (ref 36.0–46.0)
Hemoglobin: 13.4 g/dL (ref 12.0–15.0)
MCH: 31.9 pg (ref 26.0–34.0)
MCHC: 34.4 g/dL (ref 30.0–36.0)
MCV: 92.6 fL (ref 80.0–100.0)
Platelets: 179 10*3/uL (ref 150–400)
RBC: 4.2 MIL/uL (ref 3.87–5.11)
RDW: 12.3 % (ref 11.5–15.5)
WBC: 6.3 10*3/uL (ref 4.0–10.5)
nRBC: 0 % (ref 0.0–0.2)

## 2023-12-10 LAB — HCG, QUANTITATIVE, PREGNANCY: hCG, Beta Chain, Quant, S: 62 m[IU]/mL — ABNORMAL HIGH (ref <5)

## 2023-12-10 LAB — RESP PANEL BY RT-PCR (RSV, FLU A&B, COVID)  RVPGX2
Influenza A by PCR: NEGATIVE
Influenza B by PCR: NEGATIVE
Resp Syncytial Virus by PCR: NEGATIVE
SARS Coronavirus 2 by RT PCR: NEGATIVE

## 2023-12-10 LAB — ANTIBODY SCREEN: Antibody Screen: NEGATIVE

## 2023-12-10 LAB — ABO/RH: ABO/RH(D): B NEG

## 2023-12-10 MED ORDER — RHO D IMMUNE GLOBULIN 1500 UNIT/2ML IJ SOSY
300.0000 ug | PREFILLED_SYRINGE | Freq: Once | INTRAMUSCULAR | Status: AC
  Administered 2023-12-10: 300 ug via INTRAMUSCULAR
  Filled 2023-12-10: qty 2

## 2023-12-10 MED ORDER — OXYCODONE-ACETAMINOPHEN 5-325 MG PO TABS: 1.0000 | ORAL_TABLET | ORAL | 0 refills | Status: DC | PRN

## 2023-12-10 NOTE — ED Triage Notes (Signed)
Pt to ED For vaginal bleeding started today. [redacted] weeks pregnant. Reports back pain for past few days Takes meds for HTN

## 2023-12-10 NOTE — ED Triage Notes (Signed)
First nurse note: Brought over from [facility] for vaginal bleeding. Reports she is 5 weeks.

## 2023-12-10 NOTE — ED Provider Notes (Signed)
[facility] Provider Note    Event Date/Time   First MD Initiated Contact with Patient 12/10/23 1119     (approximate)   History   Chief Complaint Vaginal Bleeding   HPI  Gabriela Thomas is a 42 y.o. female [id number] at approximately 5 weeks of pregnancy with past medical history of hypertension and uterine fibroids who presents to the ED complaining of vaginal bleeding.  Patient reports that she has been dealing with crampy pain in her lower back for the past couple of days along with some dark brown vaginal discharge.  She then noticed a gush of blood when she was at work earlier this morning, has had some ongoing bleeding since then but reports only having to change her pad once in the past 4 hours.  She denies passing anything other than blood, has had some crampy pain in her lower abdomen.  She has not yet seen an OB/GYN or had an ultrasound this pregnancy, has an appointment scheduled to establish care in 2 days.     Physical Exam   Triage Vital Signs: ED Triage Vitals  Encounter Vitals Group     BP 12/10/23 0942 (!) 154/109     Systolic BP Percentile --      Diastolic BP Percentile --      Pulse Rate 12/10/23 0939 88     Resp 12/10/23 0939 18     Temp 12/10/23 0939 98 F (36.7 C)     Temp src --      SpO2 12/10/23 0939 100 %     Weight 12/10/23 0940 178 lb (80.7 kg)     Height 12/10/23 0940 5' 4.5" (1.638 m)     Head Circumference --      Peak Flow --      Pain Score 12/10/23 0939 5     Pain Loc --      Pain Education --      Exclude from Growth Chart --     Most recent vital signs: Vitals:   12/10/23 0939 12/10/23 0942  BP:  (!) 154/109  Pulse: 88   Resp: 18   Temp: 98 F (36.7 C)   SpO2: 100%     Constitutional: Alert and oriented. Eyes: Conjunctivae are normal. Head: Atraumatic. Nose: No congestion/rhinnorhea. Mouth/Throat: Mucous membranes are moist.  Cardiovascular: Normal rate, regular rhythm. Grossly normal heart  sounds.  2+ radial pulses bilaterally. Respiratory: Normal respiratory effort.  No retractions. Lungs CTAB. Gastrointestinal: Soft and nontender. No distention. Musculoskeletal: No lower extremity tenderness nor edema.  Neurologic:  Normal speech and language. No gross focal neurologic deficits are appreciated.    ED Results / Procedures / Treatments   Labs (all labs ordered are listed, but only abnormal results are displayed) Labs Reviewed  BASIC METABOLIC PANEL - Abnormal; Notable for the following components:      Result Value   Glucose, Bld 101 (*)    All other components within normal limits  HCG, QUANTITATIVE, PREGNANCY - Abnormal; Notable for the following components:   hCG, Beta Chain, Quant, S 62 (*)    All other components within normal limits  RESP PANEL BY RT-PCR (RSV, FLU A&B, COVID)  RVPGX2  CBC  POC URINE PREG, ED  ABO/RH  ANTIBODY SCREEN  RHOGAM INJECTION   RADIOLOGY Pelvic ultrasound reviewed and interpreted by me with no intrauterine or ectopic pregnancy.  PROCEDURES:  Critical Care performed: No  Procedures   MEDICATIONS ORDERED IN ED: Medications  rho (  d) immune globulin (RHIG/RHOPHYLAC) injection 300 mcg (has no administration in time range)     IMPRESSION / MDM / ASSESSMENT AND PLAN / ED COURSE  I reviewed the triage vital signs and the nursing notes.                              42 y.o. female [phone] at approximately 5 weeks of pregnancy with past medical history of hypertension and uterine fibroids who presents to the ED complaining of vaginal bleeding starting earlier this morning preceded by lower back pain and dark discharge for the past 2 days.  Patient's presentation is most consistent with acute presentation with potential threat to life or bodily function.  Differential diagnosis includes, but is not limited to, ectopic pregnancy, threatened miscarriage, completed miscarriage, anemia.  Patient well-appearing and in no acute  distress, vital signs are remarkable for hypertension but otherwise reassuring.  Labs are reassuring with no significant anemia, leukocytosis, electrolyte abnormality, or AKI.  Beta-hCG levels are quite low at 62, patient is Rh- and we will give dose of RhoGAM.  Pelvic ultrasound with pregnancy of unknown anatomic location and with relatively mild bleeding at this time, patient appropriate for outpatient follow-up with OB/GYN.  She has an appointment in 2 days, was counseled to have repeat ultrasound and hCG levels.  She was counseled to return to the ED for new or worsening symptoms, patient agrees with plan.      FINAL CLINICAL IMPRESSION(S) / ED DIAGNOSES   Final diagnoses:  Pregnancy of unknown anatomic location  Vaginal bleeding in pregnancy     Rx / DC Orders   ED Discharge Orders     None        Note:  This document was prepared using Dragon voice recognition software and may include unintentional dictation errors.   Chesley Noon, MD 12/10/23 1155

## 2023-12-11 LAB — RHOGAM INJECTION: Unit division: 0

## 2023-12-12 ENCOUNTER — Telehealth: Payer: Self-pay

## 2023-12-12 ENCOUNTER — Other Ambulatory Visit: Payer: BC Managed Care – PPO

## 2023-12-12 NOTE — Telephone Encounter (Signed)
Gabriela Thomas called triage stating she was seen in the ED 12/14 for a possible miscarriage as she was cramping and passing blood clots. She was advised to come here today for beta labs. She's scheduled to come in today at 2:20 PM. Jalah also asked if she could get another refill of the OxyCODONE-acetaminophen (Percocet) 5-325mg  tablet as they only gave her enough to get her through the weekend, I advised her I would have to ask the midwife on call for. She understood.

## 2023-12-13 LAB — BETA HCG QUANT (REF LAB): hCG Quant: 15 m[IU]/mL

## 2023-12-14 ENCOUNTER — Other Ambulatory Visit: Payer: Self-pay | Admitting: Certified Nurse Midwife

## 2023-12-14 ENCOUNTER — Telehealth: Payer: Self-pay

## 2023-12-14 DIAGNOSIS — O039 Complete or unspecified spontaneous abortion without complication: Secondary | ICD-10-CM

## 2023-12-14 NOTE — Telephone Encounter (Signed)
Spoke with patient. She has reviewed her results on my chart. Calling to be scheduled for ER F/U per discharge instructions. Advised will have provider review message and advise when she needs to be seen. We will contact her back to schedule. Patient also mentioned she needs leave papers completed. She will come by office to drop those off/pay fee.

## 2023-12-14 NOTE — Progress Notes (Signed)
TC to [place] to review lab results, anticipatory guidance for resolution of miscarriage & warning signs reviewed. Return for follow up Hcg level 12/30, order placed.

## 2023-12-14 NOTE — Telephone Encounter (Signed)
TRIAGE VOICEMAIL: Patient requesting a return call regarding 12/13/23 lab results.

## 2023-12-16 DIAGNOSIS — Z0289 Encounter for other administrative examinations: Secondary | ICD-10-CM

## 2023-12-19 ENCOUNTER — Other Ambulatory Visit: Payer: BC Managed Care – PPO

## 2024-02-04 ENCOUNTER — Ambulatory Visit
Admission: EM | Admit: 2024-02-04 | Discharge: 2024-02-04 | Disposition: A | Discharge status: Home / Self Care | Payer: BC Managed Care – PPO | Attending: Emergency Medicine | Admitting: Emergency Medicine

## 2024-02-04 ENCOUNTER — Encounter: Payer: Self-pay | Admitting: Emergency Medicine

## 2024-02-04 DIAGNOSIS — J09X2 Influenza due to identified novel influenza A virus with other respiratory manifestations: Secondary | ICD-10-CM | POA: Diagnosis present

## 2024-02-04 LAB — RESP PANEL BY RT-PCR (FLU A&B, COVID) ARPGX2
Influenza A by PCR: POSITIVE — AB
Influenza B by PCR: NEGATIVE
SARS Coronavirus 2 by RT PCR: NEGATIVE

## 2024-02-04 LAB — GROUP A STREP BY PCR: Group A Strep by PCR: NOT DETECTED

## 2024-02-04 MED ORDER — BENZONATATE 100 MG PO CAPS: 200.0000 mg | ORAL_CAPSULE | Freq: Three times a day (TID) | ORAL | 0 refills | Status: DC

## 2024-02-04 MED ORDER — OSELTAMIVIR PHOSPHATE 75 MG PO CAPS: 75.0000 mg | ORAL_CAPSULE | Freq: Two times a day (BID) | ORAL | 0 refills | Status: DC

## 2024-02-04 MED ORDER — IPRATROPIUM BROMIDE 0.06 % NA SOLN: 2.0000 | Freq: Four times a day (QID) | NASAL | 12 refills | Status: DC

## 2024-02-04 MED ORDER — FLUTICASONE PROPIONATE 50 MCG/ACT NA SUSP: 2.0000 | Freq: Every day | NASAL | 1 refills | Status: DC

## 2024-02-04 MED ORDER — PROMETHAZINE-DM 6.25-15 MG/5ML PO SYRP: 5.0000 mL | ORAL_SOLUTION | Freq: Four times a day (QID) | ORAL | 0 refills | Status: DC | PRN

## 2024-02-04 NOTE — Discharge Instructions (Addendum)
 You have been diagnosed with influenza A today.  You may use over-the-counter Tylenol  or the package suction as needed for any fever or bodyaches.  You may also use plain over-the-counter Robitussin to help with cough and congestion.  Use the Flonase , 2 squirts up each nostril at bedtime, to help with nasal congestion.  Return for reevaluation, or see your primary care provider, for new or worsening symptoms.

## 2024-02-04 NOTE — ED Provider Notes (Addendum)
 [facility]    CSN: [id number] Arrival date & time: 02/04/24  1128      History   Chief Complaint Chief Complaint  Patient presents with   Cough   Sore Throat    HPI Gabriela Thomas is a 43 y.o. female.   HPI  42 year old female with past medical history significant for essential hypertension, childhood asthma, GERD, headaches, anxiety, and uterine fibroids presents for evaluation of flulike symptoms that began 2 to 3 days ago.  She reports that her 36-month-old child is in daycare and was experiencing respiratory symptoms last week but she denies any other sick contacts.  She endorses runny nose and nasal congestion, ear pain, burning in her chest, a cough that is intermittently productive for light yellow sputum, and some intermittent shortness of breath.  She denies measured fever, wheezing, or GI symptoms.  Past Medical History:  Diagnosis Date   Anxiety    Childhood asthma    Fibroid    GERD (gastroesophageal reflux disease)    Headache    Hypertension    Macromastia    Maternal chronic hypertension, third trimester    Miscarriage    PONV (postoperative nausea and vomiting)     Patient Active Problem List   Diagnosis Date Noted   History of uterine fibroid 04/29/2021   Overweight (BMI 25.0-29.9) 04/29/2021   Essential hypertension 11/19/2018    Past Surgical History:  Procedure Laterality Date   BREAST REDUCTION SURGERY Bilateral 07/14/2020   Procedure: MAMMARY REDUCTION  (BREAST);  Surgeon: Elisabeth Craig GORMAN, MD;  Location: [facility];  Service: Plastics;  Laterality: Bilateral;   CESAREAN SECTION N/A 05/20/2023   Procedure: PLTCS (PRIMARY LOW TRANSVERSE C-SECTION);  Surgeon: Connell Davies, MD;  Location: [facility];  Service: Obstetrics;  Laterality: N/A;   LAPAROTOMY N/A 12/12/2018   Procedure: EXPLORATORY LAPAROTOMY, evacuation of hemoperitoneum, aspiration of left adnexal cyst;  Surgeon: Connell Davies, MD;  Location: [facility];   Service: Gynecology;  Laterality: N/A;   MYOMECTOMY N/A 12/11/2018   Procedure: ABDOMINAL MYOMECTOMY;  Surgeon: Connell Davies, MD;  Location: [facility];  Service: Gynecology;  Laterality: N/A;    OB History     Gravida  5   Para  2   Term  2   Preterm      AB  2   Living  2      SAB  2   IAB      Ectopic      Multiple  0   Live Births  2            Home Medications    Prior to Admission medications   Medication Sig Start Date End Date Taking? Authorizing Provider  fluticasone  (FLONASE ) 50 MCG/ACT nasal spray Place 2 sprays into both nostrils daily. 02/04/24  Yes Bernardino Ditch, NP  docusate sodium  (COLACE) 100 MG capsule Take 100 mg by mouth 2 (two) times daily.    [provider]  ferrous sulfate  325 (65 FE) MG EC tablet Take 1 tablet (325 mg total) by mouth daily with breakfast. 05/21/23 05/20/24  Connell Davies, MD  folic acid (FOLVITE) 800 MCG tablet Take 800 mcg by mouth daily.    [provider]  ibuprofen  (ADVIL ) 600 MG tablet Take 1 tablet (600 mg total) by mouth every 6 (six) hours as needed. 05/21/23   Connell Davies, MD  NIFEdipine  (PROCARDIA -XL/NIFEDICAL-XL) 30 MG 24 hr tablet Take 1 tablet (30 mg total) by mouth daily. Can increase to  twice a day as needed for symptomatic contractions 05/26/23   Connell Davies, MD    Family History Family History  Problem Relation Age of Onset   Hypertension Mother    Diabetes Mother    Congestive Heart Failure Mother    Obesity Mother    Alcohol abuse Father    Hypertension Maternal Grandfather    Hypertension Maternal Aunt    Hypertension Maternal Uncle     Social History Social History   Tobacco Use   Smoking status: Never   Smokeless tobacco: Never  Vaping Use   Vaping status: Never Used  Substance Use Topics   Alcohol use: Not Currently    Alcohol/week: 7.0 standard drinks of alcohol    Types: 7 Glasses of wine per week   Drug use: No     Allergies   Patient has no known  allergies.   Review of Systems Review of Systems  Constitutional:  Negative for fever.  HENT:  Positive for congestion, ear pain, rhinorrhea and sore throat.   Respiratory:  Positive for cough, shortness of breath and wheezing.   Gastrointestinal:  Negative for diarrhea, nausea and vomiting.  Musculoskeletal:  Positive for arthralgias and myalgias.  Skin:  Negative for rash.  Neurological:  Positive for headaches.     Physical Exam Triage Vital Signs ED Triage Vitals  Encounter Vitals Group     BP 02/04/24 1155 (!) 144/84     Systolic BP Percentile --      Diastolic BP Percentile --      Pulse Rate 02/04/24 1155 (!) 104     Resp 02/04/24 1155 15     Temp 02/04/24 1155 99.6 F (37.6 C)     Temp Source 02/04/24 1155 Oral     SpO2 02/04/24 1155 100 %     Weight 02/04/24 1154 177 lb 14.6 oz (80.7 kg)     Height 02/04/24 1154 5' 4 (1.626 m)     Head Circumference --      Peak Flow --      Pain Score 02/04/24 1154 5     Pain Loc --      Pain Education --      Exclude from Growth Chart --    No data found.  Updated Vital Signs BP (!) 144/84 (BP Location: Left Arm)   Pulse (!) 104   Temp 99.6 F (37.6 C) (Oral)   Resp 15   Ht 5' 4 (1.626 m)   Wt 177 lb 14.6 oz (80.7 kg)   LMP 01/08/2024 (Exact Date)   SpO2 100%   BMI 30.54 kg/m   Visual Acuity Right Eye Distance:   Left Eye Distance:   Bilateral Distance:    Right Eye Near:   Left Eye Near:    Bilateral Near:     Physical Exam Vitals and nursing note reviewed.  Constitutional:      Appearance: Normal appearance. She is not ill-appearing.  HENT:     Head: Normocephalic and atraumatic.     Right Ear: Tympanic membrane, ear canal and external ear normal. There is no impacted cerumen.     Left Ear: Tympanic membrane, ear canal and external ear normal. There is no impacted cerumen.     Nose: Congestion and rhinorrhea present.     Comments: Nasal mucosa are erythematous and edematous with scant clear  discharge in both nares.    Mouth/Throat:     Mouth: Mucous membranes are moist.     Pharynx: Oropharynx is  clear. Posterior oropharyngeal erythema present. No oropharyngeal exudate.     Comments: Soft palate and tonsillar pillars are unremarkable.  There is erythema to the posterior oropharynx with clear postnasal drip. Neck:     Comments: Bilateral anterior, nontender, cervical lymphadenopathy present. Cardiovascular:     Rate and Rhythm: Normal rate and regular rhythm.     Pulses: Normal pulses.     Heart sounds: Normal heart sounds. No murmur heard.    No friction rub. No gallop.  Pulmonary:     Effort: Pulmonary effort is normal.     Breath sounds: Normal breath sounds. No wheezing, rhonchi or rales.  Musculoskeletal:     Cervical back: Normal range of motion and neck supple. No tenderness.  Lymphadenopathy:     Cervical: Cervical adenopathy present.  Skin:    General: Skin is warm and dry.     Capillary Refill: Capillary refill takes less than 2 seconds.     Findings: No rash.  Neurological:     General: No focal deficit present.     Mental Status: She is alert and oriented to person, place, and time.      UC Treatments / Results  Labs (all labs ordered are listed, but only abnormal results are displayed) Labs Reviewed  RESP PANEL BY RT-PCR (FLU A&B, COVID) ARPGX2 - Abnormal; Notable for the following components:      Result Value   Influenza A by PCR POSITIVE (*)    All other components within normal limits  GROUP A STREP BY PCR    EKG   Radiology No results found.  Procedures Procedures (including critical care time)  Medications Ordered in UC Medications - No data to display  Initial Impression / Assessment and Plan / UC Course  I have reviewed the triage vital signs and the nursing notes.  Pertinent labs & imaging results that were available during my care of the patient were reviewed by me and considered in my medical decision making (see chart for  details).   Patient is a pleasant, nontoxic-appearing 43 old female presenting for evaluation of flulike symptoms that began 2 to 3 days ago as outlined HPI above.  Her physical exam does reveal inflammation of her upper respiratory tract with inflamed nasal mucosa and clear rhinorrhea.  Also erythema to the posterior pharynx with clear postnasal drip.  Tonsillar pillars are unremarkable.  Cardiopulmonary exam is benign.  Differential diagnosis include COVID, influenza, strep, viral respiratory illness.  I will order a COVID and flu PCR as well as a strep PCR.  Strep PCR is negative.  Respiratory panel is positive for influenza A.  I will discharge patient home with diagnosis of influenza A.  The patient is currently pregnant so she does not qualify for Tamiflu , Tessalon  Perles, Atrovent  nasal spray, or Promethazine  DM cough syrup.  She may use over-the-counter Tylenol  as needed for pain.  She may also use plain over-the-counter Robitussin as needed for cough.  I will prescribe her Flonase  that she can use for her nasal congestion.  Final Clinical Impressions(s) / UC Diagnoses   Final diagnoses:  Influenza due to identified novel influenza A virus with other respiratory manifestations     Discharge Instructions      You have been diagnosed with influenza A today.  You may use over-the-counter Tylenol  or the package suction as needed for any fever or bodyaches.  You may also use plain over-the-counter Robitussin to help with cough and congestion.  Use the Flonase , 2 squirts  up each nostril at bedtime, to help with nasal congestion.  Return for reevaluation, or see your primary care provider, for new or worsening symptoms.      ED Prescriptions     Medication Sig Dispense Auth. Provider   benzonatate  (TESSALON ) 100 MG capsule  (Status: Discontinued) Take 2 capsules (200 mg total) by mouth every 8 (eight) hours. 21 capsule Bernardino Ditch, NP   ipratropium (ATROVENT ) 0.06 % nasal spray   (Status: Discontinued) Place 2 sprays into both nostrils 4 (four) times daily. 15 mL Bernardino Ditch, NP   promethazine -dextromethorphan (PROMETHAZINE -DM) 6.25-15 MG/5ML syrup  (Status: Discontinued) Take 5 mLs by mouth 4 (four) times daily as needed. 118 mL Bernardino Ditch, NP   oseltamivir  (TAMIFLU ) 75 MG capsule  (Status: Discontinued) Take 1 capsule (75 mg total) by mouth every 12 (twelve) hours. 10 capsule Bernardino Ditch, NP   fluticasone  (FLONASE ) 50 MCG/ACT nasal spray Place 2 sprays into both nostrils daily. 18.2 mL Bernardino Ditch, NP      PDMP not reviewed this encounter.   Bernardino Ditch, NP 02/04/24 1248    Bernardino Ditch, NP 02/04/24 1254

## 2024-02-04 NOTE — ED Triage Notes (Signed)
 Patient c/o cough, bodyaches, chest congestion, sore throat and chills that started 2-3 days ago.  Patient also states that she is currently pregnant.

## 2024-02-09 ENCOUNTER — Telehealth: Payer: Self-pay

## 2024-02-09 ENCOUNTER — Other Ambulatory Visit: Payer: BC Managed Care – PPO

## 2024-02-09 DIAGNOSIS — Z3200 Encounter for pregnancy test, result unknown: Secondary | ICD-10-CM

## 2024-02-09 NOTE — Telephone Encounter (Signed)
Zaynab called triage, she states she had a miscarriage in December, then a cycle in January but its more just spotting and she took a pregnancy test so she called to see if we can do Beta levels.

## 2024-02-09 NOTE — Addendum Note (Signed)
Addended by: Burtis Junes on: 02/09/2024 02:34 PM   Modules accepted: Orders

## 2024-02-10 LAB — BETA HCG QUANT (REF LAB): hCG Quant: 767 m[IU]/mL

## 2024-02-13 ENCOUNTER — Other Ambulatory Visit: Payer: Self-pay

## 2024-02-13 ENCOUNTER — Other Ambulatory Visit: Payer: BC Managed Care – PPO

## 2024-02-13 DIAGNOSIS — Z3200 Encounter for pregnancy test, result unknown: Secondary | ICD-10-CM

## 2024-02-14 ENCOUNTER — Encounter: Payer: Self-pay | Admitting: Obstetrics and Gynecology

## 2024-02-14 ENCOUNTER — Other Ambulatory Visit: Payer: Self-pay | Admitting: Obstetrics and Gynecology

## 2024-02-14 DIAGNOSIS — O039 Complete or unspecified spontaneous abortion without complication: Secondary | ICD-10-CM

## 2024-02-14 LAB — BETA HCG QUANT (REF LAB): hCG Quant: 721 m[IU]/mL

## 2024-02-15 ENCOUNTER — Other Ambulatory Visit: Payer: BC Managed Care – PPO

## 2024-02-15 ENCOUNTER — Ambulatory Visit: Admission: RE | Admit: 2024-02-15 | Payer: BC Managed Care – PPO | Source: Ambulatory Visit

## 2024-02-15 ENCOUNTER — Ambulatory Visit: Payer: BC Managed Care – PPO

## 2024-02-15 MED ORDER — IBUPROFEN 600 MG PO TABS: 600.0000 mg | ORAL_TABLET | Freq: Four times a day (QID) | ORAL | 0 refills | Status: DC | PRN

## 2024-02-15 MED ORDER — MISOPROSTOL 200 MCG PO TABS: ORAL_TABLET | ORAL | 0 refills | Status: DC

## 2024-02-15 MED ORDER — HYDROCODONE-ACETAMINOPHEN 5-325 MG PO TABS: 1.0000 | ORAL_TABLET | Freq: Four times a day (QID) | ORAL | 0 refills | Status: DC | PRN

## 2024-02-15 NOTE — Telephone Encounter (Signed)
TRIAGE VOICEMAIL: Patient requesting return call.

## 2024-02-15 NOTE — Telephone Encounter (Signed)
Spoke with patient. She advised she is bleeding and it's running down her leg. She is having some pain and requesting prescription. She is scheduled for u/s this pm and plans to wear a depends to her appointment. Advised office is closed for weather. Will send message to Dr. Valentino Saxon on call. Patient to check my chart for response.

## 2024-02-16 ENCOUNTER — Ambulatory Visit: Payer: BC Managed Care – PPO

## 2024-02-21 ENCOUNTER — Ambulatory Visit: Payer: BC Managed Care – PPO | Admitting: Obstetrics and Gynecology

## 2024-02-21 ENCOUNTER — Other Ambulatory Visit: Payer: BC Managed Care – PPO

## 2024-02-21 DIAGNOSIS — Z3200 Encounter for pregnancy test, result unknown: Secondary | ICD-10-CM

## 2024-02-21 NOTE — Telephone Encounter (Addendum)
 Patient advised she did not get the u/s last week as she was advised upon arrival that she would need to pay $1500 up front or make payments. She advised she was bleeding at the time which has subsided. Inquiring if she can get a repeat beta and have Dr. Valentino Saxon follow up with her with results. She is scheduled for follow up with Dr. Valentino Saxon this pm but doesn't want to waste hold up a slot that could be used for someone in true need. Discussed with Dr. Valentino Saxon. Ok to cancel appointment today and schedule lab appointment for repeat beta. Dr. Valentino Saxon appointment cancelled. Lab appointment scheduled 02/21/24 @2  :40. Prior orders available linked to appointment.

## 2024-02-22 ENCOUNTER — Encounter: Payer: Self-pay | Admitting: Obstetrics and Gynecology

## 2024-02-22 LAB — BETA HCG QUANT (REF LAB): hCG Quant: 17 m[IU]/mL

## 2024-08-01 ENCOUNTER — Ambulatory Visit (INDEPENDENT_AMBULATORY_CARE_PROVIDER_SITE_OTHER)

## 2024-08-01 VITALS — BP 139/82 | HR 80 | Ht 64.5 in | Wt 179.0 lb

## 2024-08-01 DIAGNOSIS — Z3491 Encounter for supervision of normal pregnancy, unspecified, first trimester: Secondary | ICD-10-CM

## 2024-08-01 DIAGNOSIS — Z3201 Encounter for pregnancy test, result positive: Secondary | ICD-10-CM | POA: Diagnosis not present

## 2024-08-01 DIAGNOSIS — Z3200 Encounter for pregnancy test, result unknown: Secondary | ICD-10-CM

## 2024-08-01 DIAGNOSIS — N912 Amenorrhea, unspecified: Secondary | ICD-10-CM

## 2024-08-01 LAB — POCT URINE PREGNANCY: Preg Test, Ur: POSITIVE — AB

## 2024-08-01 NOTE — Progress Notes (Signed)
    NURSE VISIT NOTE  Subjective:    Patient ID: Gabriela Thomas, female    DOB: 1981-05-01, 43 y.o.   MRN: [id number]  HPI  Patient is a 43 y.o. [id number] female who presents for evaluation of amenorrhea. She believes she could be pregnant. Pregnancy is desired. Sexual Activity: single partner, contraception: none. Current symptoms also include: breast tenderness, fatigue, morning sickness, and positive home pregnancy test. Last period was normal.    Objective:    BP 139/82   Pulse 80   Ht 5' 4.5 (1.638 m)   Wt 179 lb (81.2 kg)   LMP 06/22/2024 (Exact Date)   BMI 30.25 kg/m   Lab Review  Results for orders placed or performed in visit on 08/01/24  POCT urine pregnancy  Result Value Ref Range   Preg Test, Ur Positive (A) Negative    Assessment:   1. Possible pregnancy   2. Pregnancy with uncertain dates in first trimester     Plan:   Pregnancy Test: Positive  Estimated Date of Delivery: 03/29/25 BP Cuff Measurement taken. Cuff Size Adult Encouraged well-balanced diet, plenty of rest when needed, pre-natal vitamins daily and walking for exercise.  Discussed self-help for nausea, avoiding OTC medications until consulting provider or pharmacist, other than Tylenol  as needed, minimal caffeine  (1-2 cups daily) and avoiding alcohol.   She will schedule her nurse visit @ 7-[redacted] wks pregnant, u/s for dating @10  wk, and NOB visit at [redacted] wk pregnant.    Feel free to call with any questions.     Harlene Gander, CMA

## 2024-08-10 ENCOUNTER — Telehealth

## 2024-08-15 ENCOUNTER — Other Ambulatory Visit (INDEPENDENT_AMBULATORY_CARE_PROVIDER_SITE_OTHER): Payer: Self-pay

## 2024-08-15 ENCOUNTER — Ambulatory Visit: Admitting: *Deleted

## 2024-08-15 VITALS — BP 155/95 | HR 84

## 2024-08-15 DIAGNOSIS — Z3481 Encounter for supervision of other normal pregnancy, first trimester: Secondary | ICD-10-CM | POA: Diagnosis not present

## 2024-08-15 DIAGNOSIS — O34219 Maternal care for unspecified type scar from previous cesarean delivery: Secondary | ICD-10-CM | POA: Insufficient documentation

## 2024-08-15 DIAGNOSIS — Z3A01 Less than 8 weeks gestation of pregnancy: Secondary | ICD-10-CM

## 2024-08-15 DIAGNOSIS — O3429 Maternal care due to uterine scar from other previous surgery: Secondary | ICD-10-CM | POA: Insufficient documentation

## 2024-08-15 DIAGNOSIS — O3680X0 Pregnancy with inconclusive fetal viability, not applicable or unspecified: Secondary | ICD-10-CM

## 2024-08-15 DIAGNOSIS — O09529 Supervision of elderly multigravida, unspecified trimester: Secondary | ICD-10-CM | POA: Insufficient documentation

## 2024-08-15 DIAGNOSIS — O09899 Supervision of other high risk pregnancies, unspecified trimester: Secondary | ICD-10-CM | POA: Insufficient documentation

## 2024-08-15 DIAGNOSIS — O0990 Supervision of high risk pregnancy, unspecified, unspecified trimester: Secondary | ICD-10-CM | POA: Insufficient documentation

## 2024-08-15 NOTE — Progress Notes (Signed)
 New OB Intake  I explained I am completing New OB Intake today. We discussed EDD of 03/29/2025, by Last Menstrual Period. Pt is [id number]. I reviewed her allergies, medications and Medical/Surgical/OB history.    Patient Active Problem List   Diagnosis Date Noted   Previous cesarean section complicating pregnancy 08/15/2024   Supervision of high risk pregnancy, antepartum 08/15/2024   Pregnancy with history of uterine myomectomy 08/15/2024   AMA (advanced maternal age) multigravida 35+ 08/15/2024   Short interval between pregnancies affecting pregnancy, antepartum 08/15/2024   History of uterine fibroid 04/29/2021   Overweight (BMI 25.0-29.9) 04/29/2021   Essential hypertension 11/19/2018    Concerns addressed today  Patient informed that the ultrasound is considered a limited obstetric ultrasound and is not intended to be a complete ultrasound exam.  Patient also informed that the ultrasound is not being completed with the intent of assessing for fetal or placental anomalies or any pelvic abnormalities. Explained that the purpose of today's ultrasound is to assess for viability.  Patient acknowledges the purpose of the exam and the limitations of the study.     Delivery Plans Plans to deliver at [facility]. Discussed the nature of our practice with multiple providers including residents and students. Due to the size of the practice, the delivering provider may not be the same as those providing prenatal care.   MyChart/Babyscripts MyChart access verified. I explained pt will have some visits in office and some virtually. Babyscripts app discussed and ordered.   Blood Pressure Cuff Blood pressure cuff discussed and pt has one at homeDiscussed to be used for virtual visits and or if needed BP checks weekly.  Anatomy US  Explained first scheduled US  will be around 19 weeks.   Last Pap Diagnosis  Date Value Ref Range Status  11/09/2022   Final   - Negative for intraepithelial lesion or  malignancy (NILM)    First visit review I reviewed new OB appt with patient. Explained pt will be seen by Dr Herchel at first visit. Discussed [place] genetic screening with patient will get panorama at [facility]. Routine prenatal labs to be collected at [facility].  Pt had stopped her procardia  once she found out she was pregnant as it was well controlled. Advised pt to restart her procardia  after seeing her blood pressure today and to send an updated blood pressure reading this weekend. Pt verbalizes.      Wanda Buckles, RN 08/15/2024  3:04 PM

## 2024-08-30 ENCOUNTER — Other Ambulatory Visit (HOSPITAL_COMMUNITY)
Admission: RE | Admit: 2024-08-30 | Discharge: 2024-08-30 | Disposition: A | Discharge status: Home / Self Care | Source: Ambulatory Visit | Attending: Obstetrics & Gynecology | Admitting: Obstetrics & Gynecology

## 2024-08-30 ENCOUNTER — Encounter: Payer: Self-pay | Admitting: Obstetrics & Gynecology

## 2024-08-30 ENCOUNTER — Ambulatory Visit (INDEPENDENT_AMBULATORY_CARE_PROVIDER_SITE_OTHER): Admitting: Obstetrics & Gynecology

## 2024-08-30 VITALS — BP 128/82 | HR 86 | Wt 181.0 lb

## 2024-08-30 DIAGNOSIS — O99211 Obesity complicating pregnancy, first trimester: Secondary | ICD-10-CM

## 2024-08-30 DIAGNOSIS — O3429 Maternal care due to uterine scar from other previous surgery: Secondary | ICD-10-CM | POA: Diagnosis not present

## 2024-08-30 DIAGNOSIS — O10911 Unspecified pre-existing hypertension complicating pregnancy, first trimester: Secondary | ICD-10-CM | POA: Insufficient documentation

## 2024-08-30 DIAGNOSIS — O0990 Supervision of high risk pregnancy, unspecified, unspecified trimester: Secondary | ICD-10-CM | POA: Insufficient documentation

## 2024-08-30 DIAGNOSIS — Z3A09 9 weeks gestation of pregnancy: Secondary | ICD-10-CM | POA: Insufficient documentation

## 2024-08-30 DIAGNOSIS — O34219 Maternal care for unspecified type scar from previous cesarean delivery: Secondary | ICD-10-CM

## 2024-08-30 DIAGNOSIS — Z6791 Unspecified blood type, Rh negative: Secondary | ICD-10-CM

## 2024-08-30 DIAGNOSIS — O26899 Other specified pregnancy related conditions, unspecified trimester: Secondary | ICD-10-CM

## 2024-08-30 DIAGNOSIS — O0991 Supervision of high risk pregnancy, unspecified, first trimester: Secondary | ICD-10-CM | POA: Diagnosis present

## 2024-08-30 DIAGNOSIS — Z113 Encounter for screening for infections with a predominantly sexual mode of transmission: Secondary | ICD-10-CM | POA: Insufficient documentation

## 2024-08-30 DIAGNOSIS — N644 Mastodynia: Secondary | ICD-10-CM

## 2024-08-30 DIAGNOSIS — O26891 Other specified pregnancy related conditions, first trimester: Secondary | ICD-10-CM

## 2024-08-30 DIAGNOSIS — O10919 Unspecified pre-existing hypertension complicating pregnancy, unspecified trimester: Secondary | ICD-10-CM

## 2024-08-30 DIAGNOSIS — O09521 Supervision of elderly multigravida, first trimester: Secondary | ICD-10-CM

## 2024-08-30 DIAGNOSIS — O99210 Obesity complicating pregnancy, unspecified trimester: Secondary | ICD-10-CM | POA: Insufficient documentation

## 2024-08-30 MED ORDER — ASPIRIN 81 MG PO TBEC: 162.0000 mg | DELAYED_RELEASE_TABLET | Freq: Every day | ORAL | 2 refills | Status: AC

## 2024-08-30 NOTE — Progress Notes (Signed)
 INITIAL PRENATAL VISIT  History:  Gabriela Thomas is a 43 y.o. H4E7977 at [redacted]w[redacted]d by LMP, early ultrasound being seen today for her first obstetrical visit.  Her obstetrical history is significant for term SVD, followed by two SABs, then term cesarean section (done because she had a myomectomy in 2019) .  Operative note indicates that several submucosal and subserosal fibroids were removed but there was no endometrial cavity breach.  However, patient declines vaginal delivery attempt and desired cesarean section.  She desires RCS and BTS for this pregnancy.  Patient is also on Nifedipine  XL 30 mg daily for BP control. Pregnancy history fully reviewed.  Patient reports left breast pain for weeks, in the inferior portion of the breast.  No masses or drainage.  HISTORY: OB History  Gravida Para Term Preterm AB Living  5 2 2  0 2 2  SAB IAB Ectopic Multiple Live Births  2 0 0 0 2    # Outcome Date GA Lbr Len/2nd Weight Sex Type Anes PTL Lv  5 Current           4 Term 05/20/23 [redacted]w[redacted]d  6 lb 9.5 oz (2.99 kg) F CS-LTranv Spinal  LIV     Name: Hoben,GIRL Anabelen     Apgar1: 8  Apgar5: 9  3 SAB 04/10/22     SAB     2 SAB 06/2021     SAB     1 Term 10/15/98 [redacted]w[redacted]d  5 lb 5 oz (2.41 kg) F Vag-Spont  N LIV  Last pap smear was done 11/09/2022 and was normal  Past Medical History:  Diagnosis Date   Anxiety    Childhood asthma    Fibroid    GERD (gastroesophageal reflux disease)    Headache    Hypertension    Macromastia    Maternal chronic hypertension, third trimester    Miscarriage    PONV (postoperative nausea and vomiting)    Past Surgical History:  Procedure Laterality Date   BREAST REDUCTION SURGERY Bilateral 07/14/2020   Procedure: MAMMARY REDUCTION  (BREAST);  Surgeon: Elisabeth Craig GORMAN, MD;  Location: [facility];  Service: Plastics;  Laterality: Bilateral;   CESAREAN SECTION N/A 05/20/2023   Procedure: PLTCS (PRIMARY LOW TRANSVERSE C-SECTION);  Surgeon: Connell Davies,  MD;  Location: [facility];  Service: Obstetrics;  Laterality: N/A;   LAPAROTOMY N/A 12/12/2018   Procedure: EXPLORATORY LAPAROTOMY, evacuation of hemoperitoneum, aspiration of left adnexal cyst;  Surgeon: Connell Davies, MD;  Location: [facility];  Service: Gynecology;  Laterality: N/A;   MYOMECTOMY N/A 12/11/2018   Procedure: ABDOMINAL MYOMECTOMY;  Surgeon: Connell Davies, MD;  Location: [facility];  Service: Gynecology;  Laterality: N/A;   Family History  Problem Relation Age of Onset   Hypertension Mother    Diabetes Mother    Congestive Heart Failure Mother    Obesity Mother    Alcohol abuse Father    Hypertension Maternal Grandfather    Hypertension Maternal Aunt    Hypertension Maternal Uncle    Social History   Tobacco Use   Smoking status: Never   Smokeless tobacco: Never  Vaping Use   Vaping status: Never Used  Substance Use Topics   Alcohol use: Not Currently    Alcohol/week: 7.0 standard drinks of alcohol    Types: 7 Glasses of wine per week   Drug use: No   No Known Allergies Current Outpatient Medications on File Prior to Visit  Medication Sig Dispense Refill   folic acid (  FOLVITE) 800 MCG tablet Take 800 mcg by mouth daily.     NIFEdipine  (PROCARDIA -XL/NIFEDICAL-XL) 30 MG 24 hr tablet Take 1 tablet (30 mg total) by mouth daily. Can increase to twice a day as needed for symptomatic contractions (Patient not taking: Reported on 08/15/2024) 90 tablet 3   Prenatal Vit-Fe Fumarate-FA (MULTIVITAMIN-PRENATAL) 27-0.8 MG TABS tablet Take 1 tablet by mouth daily at 12 noon.     No current facility-administered medications on file prior to visit.    Review of Systems Pertinent items noted in HPI and remainder of comprehensive ROS otherwise negative.  Indications for ASA therapy  One of the following: (Needs 162 mg daily) Chronic hypertension Yes   Physical Exam:   Vitals:   08/30/24 1546  BP: 128/82  Pulse: 86  Weight: 181 lb (82.1 kg)   Fetal Heart Rate (bpm): U/S   Uterine size:   Bedside Ultrasound for FHR check: Intrauterine pregnancy with positive cardiac activity noted, unable to calculate rate, difficult abdominal ultrasound.  General: well-developed, well-nourished female in no acute distress  Breasts:  Point tenderness in inner, lower quadrant of left breast, no erythema, no masses, no skin changes, no nipple drainage.  Normal appearance, no masses, skin changes or tenderness bilaterally, exam done in the presence of a chaperone.   Skin: normal coloration and turgor, no rashes  Neurologic: oriented, normal, negative, normal mood  Extremities: normal strength, tone, and muscle mass, ROM of all joints is normal  HEENT PERRLA, extraocular movement intact and sclera clear, anicteric  Neck supple and no masses  Cardiovascular: regular rate and rhythm  Respiratory:  no respiratory distress, normal breath sounds  Abdomen: soft, non-tender; bowel sounds normal; no masses,  no organomegaly  Pelvic: deferred    Assessment:  Pregnancy: H4E7977 Patient Active Problem List   Diagnosis Date Noted   Obesity in pregnancy, antepartum 08/30/2024   Previous cesarean section complicating pregnancy 08/15/2024   Supervision of high risk pregnancy, antepartum 08/15/2024   Pregnancy with history of uterine myomectomy 08/15/2024   AMA (advanced maternal age) multigravida 40+ 08/15/2024   Preexisting hypertension complicating pregnancy, antepartum 02/24/2023   Rh negative state in antepartum period 01/05/2023   Overweight (BMI 25.0-29.9) 04/29/2021   Essential hypertension 11/19/2018    Plan:  1. Pain of left breast Breast imaging ordered, will follow up results and manage accordingly. - US  LIMITED ULTRASOUND INCLUDING AXILLA LEFT BREAST ; Future - MM 3D DIAGNOSTIC MAMMOGRAM BILATERAL BREAST; Future  2. Previous cesarean section complicating pregnancy 3. Pregnancy with history of uterine myomectomy Desires RCS and BTS.  4. Preexisting hypertension  complicating pregnancy, antepartum Continue Nifedipine  for BP control Labs ordered.  Already scheduled for detailed anatomy scan. Discussed need for serial growth scans, and weekly antenatal screening in third trimester.  ASA 162 mg recommended Referred to Cardio Obstetrics. - CBC/D/Plt+RPR+Rh+ABO+RubIgG...; Future - Comprehensive metabolic panel with GFR; Future - aspirin  EC 81 MG tablet; Take 2 tablets (162 mg total) by mouth at bedtime. Start taking when you are [redacted] weeks pregnant for rest of pregnancy for prevention of preeclampsia  Dispense: 300 tablet; Refill: 2 - AMB Referral to Cardio Obstetrics  5. Multigravida of advanced maternal age in first trimester Genetic screening labs ordered. Discussed need for serial growth scans, and weekly antenatal screening in third trimester.  - PANORAMA PRENATAL TEST; Future - HORIZON CUSTOM; Future  6. Rh negative state in antepartum period Will get Rhogam during pregnancy  7. Obesity in pregnancy, antepartum TWG 11-20 lbs recommended  8. [redacted]  weeks gestation of pregnancy 9. Supervision of high risk pregnancy, antepartum (Primary) - CBC/D/Plt+RPR+Rh+ABO+RubIgG...; Future - Culture, OB Urine - Comprehensive metabolic panel with GFR; Future - Hemoglobin A1c; Future - Cervicovaginal ancillary only - TSH Rfx on Abnormal to Free T4; Future - AMB Referral to Cardio Obstetrics   Labs to be drawn in two weeks, maximize chance of having proper fetal fraction for NIPS lab. Continue prenatal vitamins. Problem list reviewed and updated. Anticipatory guidance about prenatal visits given including labs, ultrasounds, and testing. Weight gain recommendations per IOM guidelines reviewed: underweight/BMI 18.5 or less > 28 - 40 lbs; normal weight/BMI 18.5 - 24.9 > 25 - 35 lbs; overweight/BMI 25 - 29.9 > 15 - 25 lbs; obese/BMI 30 or more > 11 - 20 lbs. Discussed usage of the Babyscripts app for more information about pregnancy, and to track blood  pressures. Also discussed usage of virtual visits as additional source of managing and completing prenatal visits.  Patient was encouraged to use MyChart to review results, send requests, and have questions addressed.   The nature of [facility] for [facility]Faculty Practice with multiple MDs and Advanced Practice Providers was explained to patient; also emphasized that residents, students are part of our team. Routine obstetric precautions reviewed. Encouraged to seek out care at our office or emergency room [facility] preferred) for urgent and/or emergent concerns. Return in 4 weeks (on 09/27/2024) for OFFICE OB VISIT (MD only).     GLORIS HUGGER, MD, FACOG Obstetrician & Gynecologist, [facility] for [employer], [facility]

## 2024-08-31 LAB — URINE CULTURE, OB REFLEX

## 2024-08-31 LAB — CULTURE, OB URINE

## 2024-09-02 ENCOUNTER — Ambulatory Visit: Payer: Self-pay | Admitting: Obstetrics & Gynecology

## 2024-09-02 DIAGNOSIS — O0990 Supervision of high risk pregnancy, unspecified, unspecified trimester: Secondary | ICD-10-CM

## 2024-09-03 LAB — CERVICOVAGINAL ANCILLARY ONLY
Chlamydia: NEGATIVE
Comment: NEGATIVE
Comment: NEGATIVE
Comment: NORMAL
Neisseria Gonorrhea: NEGATIVE
Trichomonas: NEGATIVE

## 2024-09-06 ENCOUNTER — Ambulatory Visit
Admission: RE | Admit: 2024-09-06 | Discharge: 2024-09-06 | Disposition: A | Discharge status: Home / Self Care | Source: Ambulatory Visit | Attending: Obstetrics & Gynecology | Admitting: Obstetrics & Gynecology

## 2024-09-06 ENCOUNTER — Ambulatory Visit: Admitting: Obstetrics & Gynecology

## 2024-09-06 ENCOUNTER — Encounter: Payer: Self-pay | Admitting: Obstetrics & Gynecology

## 2024-09-06 VITALS — BP 148/88 | HR 73 | Wt 182.0 lb

## 2024-09-06 DIAGNOSIS — O99211 Obesity complicating pregnancy, first trimester: Secondary | ICD-10-CM

## 2024-09-06 DIAGNOSIS — O0990 Supervision of high risk pregnancy, unspecified, unspecified trimester: Secondary | ICD-10-CM

## 2024-09-06 DIAGNOSIS — Z3A10 10 weeks gestation of pregnancy: Secondary | ICD-10-CM

## 2024-09-06 DIAGNOSIS — O10911 Unspecified pre-existing hypertension complicating pregnancy, first trimester: Secondary | ICD-10-CM

## 2024-09-06 DIAGNOSIS — O10919 Unspecified pre-existing hypertension complicating pregnancy, unspecified trimester: Secondary | ICD-10-CM

## 2024-09-06 DIAGNOSIS — O34219 Maternal care for unspecified type scar from previous cesarean delivery: Secondary | ICD-10-CM | POA: Diagnosis not present

## 2024-09-06 DIAGNOSIS — O99210 Obesity complicating pregnancy, unspecified trimester: Secondary | ICD-10-CM

## 2024-09-06 DIAGNOSIS — N644 Mastodynia: Secondary | ICD-10-CM

## 2024-09-06 DIAGNOSIS — O3429 Maternal care due to uterine scar from other previous surgery: Secondary | ICD-10-CM

## 2024-09-06 DIAGNOSIS — O09521 Supervision of elderly multigravida, first trimester: Secondary | ICD-10-CM

## 2024-09-06 MED ORDER — NIFEDIPINE ER 90 MG PO TB24: 90.0000 mg | ORAL_TABLET | Freq: Every day | ORAL | Status: DC

## 2024-09-06 NOTE — Progress Notes (Signed)
   PRENATAL VISIT NOTE  Subjective:  Gabriela Thomas is a 43 y.o. [phone] at [redacted]w[redacted]d being seen today for ongoing prenatal care, had initial visit last week.  She is currently monitored for the following issues for this high-risk pregnancy and has Essential hypertension; Overweight (BMI 25.0-29.9); Rh negative state in antepartum period; Preexisting hypertension complicating pregnancy, antepartum; Previous cesarean section complicating pregnancy; Supervision of high risk pregnancy, antepartum; Pregnancy with history of uterine myomectomy; AMA (advanced maternal age) multigravida 66+; and Obesity in pregnancy, antepartum on their problem list.  Patient reports no complaints. Having breast imaging today due to reported left breast pain, will follow up results and manage accordingly. Contractions: Not present. Vag. Bleeding: None.  Movement: Present. Denies leaking of fluid.   Here with husband.  The following portions of the patient's history were reviewed and updated as appropriate: allergies, current medications, past family history, past medical history, past social history, past surgical history and problem list.   Objective:    Vitals:   09/06/24 1015  BP: (!) 148/88  Pulse: 73  Weight: 182 lb (82.6 kg)    Fetal Status:  Fetal Heart Rate (bpm): 147 (On U/S)   Movement: Present    General: Alert, oriented and cooperative. Patient is in no acute distress.  Skin: Skin is warm and dry. No rash noted.   Cardiovascular: Normal heart rate noted  Respiratory: Normal respiratory effort, no problems with respiration noted  Abdomen: Soft, gravid, appropriate for gestational age.  Pain/Pressure: Absent     Pelvic: Cervical exam deferred        Extremities: Normal range of motion.  Edema: None  Mental Status: Normal mood and affect. Normal behavior. Normal judgment and thought content.   Assessment and Plan:  Pregnancy: H4E7977 at [redacted]w[redacted]d 1. Preexisting hypertension complicating pregnancy,  antepartum Patient reports she actually takes 90 mg of Nifedipine , this dosage was corrected. ASA already prescribed for PEC prophylaxis. Labs today.  Will have serial scans and antenatal testing later as per MFM. - TSH Rfx on Abnormal to Free T4 - Hemoglobin A1c - Comprehensive metabolic panel with GFR - CBC/D/Plt+RPR+Rh+ABO+RubIgG... - NIFEdipine  (ADALAT  CC) 90 MG 24 hr tablet; Take 1 tablet (90 mg total) by mouth daily.  2. Previous cesarean section complicating pregnancy 3. Pregnancy with history of uterine myomectomy Desires RCS+BTS, to be done ~37 weeks  4. Obesity in pregnancy, antepartum TWG recommended 11-20 lbs  5. Multigravida of advanced maternal age in first trimester - HORIZON CUSTOM - PANORAMA PRENATAL TEST  6. [redacted] weeks gestation of pregnancy 7. Supervision of high risk pregnancy, antepartum (Primary) Labs done today, will follow up results and manage accordingly. - TSH Rfx on Abnormal to Free T4 - Hemoglobin A1c - Comprehensive metabolic panel with GFR - CBC/D/Plt+RPR+Rh+ABO+RubIgG... - Ferritin No other complaints or concerns.  Routine obstetric precautions reviewed.  Please refer to After Visit Summary for other counseling recommendations.   Return in about 4 weeks (around 10/04/2024) for OFFICE OB VISIT (MD only).  Future Appointments  Date Time Provider Department Center  09/06/2024  3:00 PM [facility] MM [facility]  1 [facility]  09/12/2024  8:15 AM [facility]  09/27/2024  9:55 AM Fredirick Glenys GORMAN, MD [facility]  11/07/2024  9:45 AM [facility] PROVIDER 1 [facility] None  11/07/2024 10:00 AM [facility] US1 [facility]    Gloris Hugger, MD

## 2024-09-07 LAB — CBC/D/PLT+RPR+RH+ABO+RUBIGG...
Antibody Screen: NEGATIVE
Basophils Absolute: 0 x10E3/uL (ref 0.0–0.2)
Basos: 0 %
EOS (ABSOLUTE): 0 x10E3/uL (ref 0.0–0.4)
Eos: 1 %
HCV Ab: NONREACTIVE
HIV Screen 4th Generation wRfx: NONREACTIVE
Hematocrit: 40.6 % (ref 34.0–46.6)
Hemoglobin: 13.3 g/dL (ref 11.1–15.9)
Hepatitis B Surface Ag: NEGATIVE
Immature Grans (Abs): 0 x10E3/uL (ref 0.0–0.1)
Immature Granulocytes: 0 %
Lymphocytes Absolute: 1.6 x10E3/uL (ref 0.7–3.1)
Lymphs: 33 %
MCH: 31.4 pg (ref 26.6–33.0)
MCHC: 32.8 g/dL (ref 31.5–35.7)
MCV: 96 fL (ref 79–97)
Monocytes Absolute: 0.2 x10E3/uL (ref 0.1–0.9)
Monocytes: 4 %
Neutrophils Absolute: 3 x10E3/uL (ref 1.4–7.0)
Neutrophils: 62 %
Platelets: 202 x10E3/uL (ref 150–450)
RBC: 4.23 x10E6/uL (ref 3.77–5.28)
RDW: 12.8 % (ref 11.7–15.4)
RPR Ser Ql: NONREACTIVE
Rh Factor: NEGATIVE
Rubella Antibodies, IGG: 3.97 {index} (ref >0.99)
WBC: 4.9 x10E3/uL (ref 3.4–10.8)

## 2024-09-07 LAB — HCV INTERPRETATION

## 2024-09-07 LAB — COMPREHENSIVE METABOLIC PANEL WITH GFR
ALT: 12 IU/L (ref 0–32)
AST: 14 IU/L (ref 0–40)
Albumin: 4.2 g/dL (ref 3.9–4.9)
Alkaline Phosphatase: 62 IU/L (ref 44–121)
BUN/Creatinine Ratio: 13 (ref 9–23)
BUN: 12 mg/dL (ref 6–24)
Bilirubin Total: 0.4 mg/dL (ref 0.0–1.2)
CO2: 22 mmol/L (ref 20–29)
Calcium: 9.4 mg/dL (ref 8.7–10.2)
Chloride: 103 mmol/L (ref 96–106)
Creatinine, Ser: 0.91 mg/dL (ref 0.57–1.00)
Globulin, Total: 2.7 g/dL (ref 1.5–4.5)
Glucose: 68 mg/dL — ABNORMAL LOW (ref 70–99)
Potassium: 4 mmol/L (ref 3.5–5.2)
Sodium: 139 mmol/L (ref 134–144)
Total Protein: 6.9 g/dL (ref 6.0–8.5)
eGFR: 80 mL/min/1.73 (ref >59)

## 2024-09-07 LAB — FERRITIN: Ferritin: 71 ng/mL (ref 15–150)

## 2024-09-07 LAB — TSH RFX ON ABNORMAL TO FREE T4: TSH: 2.23 u[IU]/mL (ref 0.450–4.500)

## 2024-09-07 LAB — HEMOGLOBIN A1C
Est. average glucose Bld gHb Est-mCnc: 111 mg/dL
Hgb A1c MFr Bld: 5.5 % (ref 4.8–5.6)

## 2024-09-12 ENCOUNTER — Other Ambulatory Visit

## 2024-09-12 ENCOUNTER — Ambulatory Visit: Admitting: *Deleted

## 2024-09-12 DIAGNOSIS — O0990 Supervision of high risk pregnancy, unspecified, unspecified trimester: Secondary | ICD-10-CM

## 2024-09-12 DIAGNOSIS — Z3A11 11 weeks gestation of pregnancy: Secondary | ICD-10-CM

## 2024-09-12 DIAGNOSIS — O0991 Supervision of high risk pregnancy, unspecified, first trimester: Secondary | ICD-10-CM

## 2024-09-12 LAB — PANORAMA PRENATAL TEST FULL PANEL:PANORAMA TEST PLUS 5 ADDITIONAL MICRODELETIONS: FETAL FRACTION: 6.6

## 2024-09-12 NOTE — Progress Notes (Signed)
 Pt here for FHR check. Denies andy concerns at this time.   FHR 161 via doppler.    Wanda Buckles, RN

## 2024-09-19 ENCOUNTER — Encounter: Admitting: Advanced Practice Midwife

## 2024-09-19 ENCOUNTER — Encounter: Payer: Self-pay | Admitting: *Deleted

## 2024-09-19 LAB — HORIZON CUSTOM: REPORT SUMMARY: NEGATIVE

## 2024-09-27 ENCOUNTER — Encounter: Admitting: Family Medicine

## 2024-10-02 ENCOUNTER — Ambulatory Visit: Admitting: Obstetrics & Gynecology

## 2024-10-02 ENCOUNTER — Encounter: Payer: Self-pay | Admitting: Obstetrics & Gynecology

## 2024-10-02 VITALS — BP 145/88 | HR 74 | Wt 181.4 lb

## 2024-10-02 DIAGNOSIS — O09522 Supervision of elderly multigravida, second trimester: Secondary | ICD-10-CM | POA: Diagnosis not present

## 2024-10-02 DIAGNOSIS — O34219 Maternal care for unspecified type scar from previous cesarean delivery: Secondary | ICD-10-CM | POA: Diagnosis not present

## 2024-10-02 DIAGNOSIS — O10919 Unspecified pre-existing hypertension complicating pregnancy, unspecified trimester: Secondary | ICD-10-CM | POA: Diagnosis not present

## 2024-10-02 DIAGNOSIS — Z3A14 14 weeks gestation of pregnancy: Secondary | ICD-10-CM

## 2024-10-02 DIAGNOSIS — O3429 Maternal care due to uterine scar from other previous surgery: Secondary | ICD-10-CM | POA: Diagnosis not present

## 2024-10-02 DIAGNOSIS — O0990 Supervision of high risk pregnancy, unspecified, unspecified trimester: Secondary | ICD-10-CM

## 2024-10-02 MED ORDER — LABETALOL HCL 200 MG PO TABS: 200.0000 mg | ORAL_TABLET | Freq: Two times a day (BID) | ORAL | 3 refills | Status: DC

## 2024-10-02 NOTE — Progress Notes (Signed)
   PRENATAL VISIT NOTE  Subjective:  Gabriela Thomas is a 43 y.o. H4E7977 at [redacted]w[redacted]d being seen today for ongoing prenatal care.  She is currently monitored for the following issues for this high-risk pregnancy and has Essential hypertension; Overweight (BMI 25.0-29.9); Rh negative state in antepartum period; Preexisting hypertension complicating pregnancy, antepartum; Previous cesarean section complicating pregnancy; Supervision of high risk pregnancy, antepartum; Pregnancy with history of uterine myomectomy; AMA (advanced maternal age) multigravida 61+; and Obesity in pregnancy, antepartum on their problem list.  Patient reports having headaches after taking Nifedipine , went from usual 90 mg dose to 30 mg and still had bad headaches.  Wants to try other medication.  Contractions: Not present. Vag. Bleeding: None.  Movement: Absent. Denies leaking of fluid.   The following portions of the patient's history were reviewed and updated as appropriate: allergies, current medications, past family history, past medical history, past social history, past surgical history and problem list.   Objective:    Vitals:   10/02/24 1640  BP: (!) 145/88  Pulse: 74  Weight: 181 lb 6.4 oz (82.3 kg)    Fetal Status:  Fetal Heart Rate (bpm): 165   Movement: Absent    General: Alert, oriented and cooperative. Patient is in no acute distress.  Skin: Skin is warm and dry. No rash noted.   Cardiovascular: Normal heart rate noted  Respiratory: Normal respiratory effort, no problems with respiration noted  Abdomen: Soft, gravid, appropriate for gestational age.  Pain/Pressure: Absent     Pelvic: Cervical exam deferred        Extremities: Normal range of motion.  Edema: None  Mental Status: Normal mood and affect. Normal behavior. Normal judgment and thought content.   Assessment and Plan:  Pregnancy: H4E7977 at [redacted]w[redacted]d 1. Preexisting hypertension complicating pregnancy, antepartum (Primary) Will switch to  Labetalol, titrate as needed.  Will monitor response, continue to watch BP closely. Continue ASA 162 mg daily. Awaiting Cardio OB appointment - labetalol (NORMODYNE) 200 MG tablet; Take 1 tablet (200 mg total) by mouth 2 (two) times daily.  Dispense: 60 tablet; Refill: 3  2. Previous cesarean section complicating pregnancy 3. Pregnancy with history of uterine myomectomy Desires RCS+BTS, to be done ~37 weeks  4. Multigravida of advanced maternal age in second trimester 5. [redacted] weeks gestation of pregnancy 6. Supervision of high risk pregnancy, antepartum [O09.90] Anatomy scan ordered.  LR NIPS. No other complaints or concerns.  Routine obstetric precautions reviewed.  Please refer to After Visit Summary for other counseling recommendations.   Return in about 4 weeks (around 10/30/2024) for OFFICE OB VISIT (MD only).  Future Appointments  Date Time Provider Department Center  11/01/2024 11:15 AM Fredirick Glenys GORMAN, MD [facility]  11/07/2024  9:45 AM [facility] PROVIDER 1 [facility] None  11/07/2024 10:00 AM [facility] US1 [facility]    Gloris Hugger, MD

## 2024-11-01 ENCOUNTER — Ambulatory Visit: Admitting: Family Medicine

## 2024-11-01 VITALS — BP 144/83 | HR 92 | Wt 185.0 lb

## 2024-11-01 DIAGNOSIS — O0992 Supervision of high risk pregnancy, unspecified, second trimester: Secondary | ICD-10-CM

## 2024-11-01 DIAGNOSIS — Z3A19 19 weeks gestation of pregnancy: Secondary | ICD-10-CM

## 2024-11-01 DIAGNOSIS — O0990 Supervision of high risk pregnancy, unspecified, unspecified trimester: Secondary | ICD-10-CM

## 2024-11-01 DIAGNOSIS — Z6791 Unspecified blood type, Rh negative: Secondary | ICD-10-CM

## 2024-11-01 DIAGNOSIS — O10912 Unspecified pre-existing hypertension complicating pregnancy, second trimester: Secondary | ICD-10-CM

## 2024-11-01 DIAGNOSIS — O34219 Maternal care for unspecified type scar from previous cesarean delivery: Secondary | ICD-10-CM

## 2024-11-01 DIAGNOSIS — O26892 Other specified pregnancy related conditions, second trimester: Secondary | ICD-10-CM

## 2024-11-01 DIAGNOSIS — O09522 Supervision of elderly multigravida, second trimester: Secondary | ICD-10-CM

## 2024-11-01 DIAGNOSIS — O10919 Unspecified pre-existing hypertension complicating pregnancy, unspecified trimester: Secondary | ICD-10-CM

## 2024-11-01 MED ORDER — LABETALOL HCL 200 MG PO TABS: 200.0000 mg | ORAL_TABLET | Freq: Two times a day (BID) | ORAL | 3 refills | Status: AC

## 2024-11-03 ENCOUNTER — Ambulatory Visit: Payer: Self-pay | Admitting: Family Medicine

## 2024-11-03 DIAGNOSIS — O0990 Supervision of high risk pregnancy, unspecified, unspecified trimester: Secondary | ICD-10-CM

## 2024-11-03 LAB — AFP, SERUM, OPEN SPINA BIFIDA
AFP MoM: 0.98
AFP Value: 43.6 ng/mL
Gest. Age on Collection Date: 18.6 wk
Maternal Age At EDD: 44.2 a
OSBR Risk 1 IN: 10000
Test Results:: NEGATIVE
Weight: 185 [lb_av]

## 2024-11-03 NOTE — Progress Notes (Signed)
 PRENATAL VISIT NOTE  Subjective:  Gabriela Thomas is a 43 y.o. H4E7977 at [redacted]w[redacted]d being seen today for ongoing prenatal care.  She is currently monitored for the following issues for this high-risk pregnancy and has Essential hypertension; Overweight (BMI 25.0-29.9); Rh negative state in antepartum period; Preexisting hypertension complicating pregnancy, antepartum; Previous cesarean section complicating pregnancy; Supervision of high risk pregnancy, antepartum; Pregnancy with history of uterine myomectomy; AMA (advanced maternal age) multigravida 12+; and Obesity in pregnancy, antepartum on their problem list.  Patient reports no complaints.  Contractions: Not present. Vag. Bleeding: None.  Movement: Present. Denies leaking of fluid.   The following portions of the patient's history were reviewed and updated as appropriate: allergies, current medications, past family history, past medical history, past social history, past surgical history and problem list.   Objective:   Vitals:   11/01/24 1145  BP: (!) 144/83  Pulse: 92  Weight: 185 lb (83.9 kg)    Fetal Status:      Movement: Present    General: Alert, oriented and cooperative. Patient is in no acute distress.  Skin: Skin is warm and dry. No rash noted.   Cardiovascular: Normal heart rate noted  Respiratory: Normal respiratory effort, no problems with respiration noted  Abdomen: Soft, gravid, appropriate for gestational age.  Pain/Pressure: Absent     Pelvic: Cervical exam deferred        Extremities: Normal range of motion.  Edema: None  Mental Status: Normal mood and affect. Normal behavior. Normal judgment and thought content.      09/06/2024   12:00 PM  Depression screen PHQ 2/9  Decreased Interest 0  Down, Depressed, Hopeless 0  PHQ - 2 Score 0  Altered sleeping 0  Tired, decreased energy 3  Change in appetite 0  Feeling bad or failure about yourself  0  Trouble concentrating 0  Moving slowly or fidgety/restless 0   Suicidal thoughts 0  PHQ-9 Score 3   Difficult doing work/chores Not difficult at all     Data saved with a previous flowsheet row definition        09/06/2024   12:00 PM  GAD 7 : Generalized Anxiety Score  Nervous, Anxious, on Edge 1  Control/stop worrying 0  Worry too much - different things 1  Trouble relaxing 0  Restless 0  Easily annoyed or irritable 0  Afraid - awful might happen 0  Total GAD 7 Score 2  Anxiety Difficulty Not difficult at all    Assessment and Plan:  Pregnancy: H4E7977 at [redacted]w[redacted]d 1. Preexisting hypertension complicating pregnancy, antepartum (Primary) Refilled her Labetalol, has not picked this up - labetalol (NORMODYNE) 200 MG tablet; Take 1 tablet (200 mg total) by mouth 2 (two) times daily.  Dispense: 60 tablet; Refill: 3  2. Supervision of high risk pregnancy, antepartum AFP today Continue prenatal care. Has anatomy u/s coming up - AFP, Serum, Open Spina Bifida  3. Rh negative state in antepartum period Rhogam @ 28 weeks and pp if indicated  4. Previous cesarean section complicating pregnancy For RCS due to myomectomy  5. Multigravida of advanced maternal age in second trimester LR NIPT  6. [redacted] weeks gestation of pregnancy  Preterm labor symptoms and general obstetric precautions including but not limited to vaginal bleeding, contractions, leaking of fluid and fetal movement were reviewed in detail with the patient. Please refer to After Visit Summary for other counseling recommendations.   Return in 4 weeks (on 11/29/2024).  Future Appointments  Date Time  Provider Department Center  11/07/2024  9:45 AM [facility] 1 [facility] None  11/07/2024 10:00 AM [facility] US[facility]  11/29/2024  8:55 AM Izell Harari, Gabriela Thomas [facility]  01/01/2025  8:55 AM Anyanwu, Gloris LABOR, Gabriela Thomas [facility]    Gabriela GORMAN Birk, Gabriela Thomas

## 2024-11-07 ENCOUNTER — Ambulatory Visit

## 2024-11-07 ENCOUNTER — Ambulatory Visit: Attending: Maternal & Fetal Medicine

## 2024-11-07 ENCOUNTER — Other Ambulatory Visit: Payer: Self-pay

## 2024-11-07 VITALS — BP 143/82 | HR 88

## 2024-11-07 DIAGNOSIS — O99210 Obesity complicating pregnancy, unspecified trimester: Secondary | ICD-10-CM

## 2024-11-07 DIAGNOSIS — D259 Leiomyoma of uterus, unspecified: Secondary | ICD-10-CM | POA: Insufficient documentation

## 2024-11-07 DIAGNOSIS — E669 Obesity, unspecified: Secondary | ICD-10-CM | POA: Diagnosis not present

## 2024-11-07 DIAGNOSIS — Z3A19 19 weeks gestation of pregnancy: Secondary | ICD-10-CM | POA: Insufficient documentation

## 2024-11-07 DIAGNOSIS — O3412 Maternal care for benign tumor of corpus uteri, second trimester: Secondary | ICD-10-CM | POA: Diagnosis not present

## 2024-11-07 DIAGNOSIS — O10012 Pre-existing essential hypertension complicating pregnancy, second trimester: Secondary | ICD-10-CM | POA: Diagnosis not present

## 2024-11-07 DIAGNOSIS — O09892 Supervision of other high risk pregnancies, second trimester: Secondary | ICD-10-CM | POA: Diagnosis not present

## 2024-11-07 DIAGNOSIS — O34219 Maternal care for unspecified type scar from previous cesarean delivery: Secondary | ICD-10-CM | POA: Diagnosis not present

## 2024-11-07 DIAGNOSIS — O3429 Maternal care due to uterine scar from other previous surgery: Secondary | ICD-10-CM | POA: Insufficient documentation

## 2024-11-07 DIAGNOSIS — O10919 Unspecified pre-existing hypertension complicating pregnancy, unspecified trimester: Secondary | ICD-10-CM

## 2024-11-07 DIAGNOSIS — O99212 Obesity complicating pregnancy, second trimester: Secondary | ICD-10-CM | POA: Insufficient documentation

## 2024-11-07 DIAGNOSIS — O09522 Supervision of elderly multigravida, second trimester: Secondary | ICD-10-CM | POA: Insufficient documentation

## 2024-11-07 DIAGNOSIS — O09899 Supervision of other high risk pregnancies, unspecified trimester: Secondary | ICD-10-CM

## 2024-11-07 DIAGNOSIS — O0992 Supervision of high risk pregnancy, unspecified, second trimester: Secondary | ICD-10-CM | POA: Insufficient documentation

## 2024-11-07 DIAGNOSIS — O09529 Supervision of elderly multigravida, unspecified trimester: Secondary | ICD-10-CM

## 2024-11-07 DIAGNOSIS — O0990 Supervision of high risk pregnancy, unspecified, unspecified trimester: Secondary | ICD-10-CM

## 2024-11-29 ENCOUNTER — Ambulatory Visit: Admitting: Obstetrics and Gynecology

## 2024-11-29 VITALS — BP 114/75 | HR 79 | Wt 187.6 lb

## 2024-11-29 DIAGNOSIS — O10912 Unspecified pre-existing hypertension complicating pregnancy, second trimester: Secondary | ICD-10-CM

## 2024-11-29 DIAGNOSIS — Z6791 Unspecified blood type, Rh negative: Secondary | ICD-10-CM

## 2024-11-29 DIAGNOSIS — O26899 Other specified pregnancy related conditions, unspecified trimester: Secondary | ICD-10-CM

## 2024-11-29 DIAGNOSIS — O34219 Maternal care for unspecified type scar from previous cesarean delivery: Secondary | ICD-10-CM | POA: Diagnosis not present

## 2024-11-29 DIAGNOSIS — O3429 Maternal care due to uterine scar from other previous surgery: Secondary | ICD-10-CM | POA: Diagnosis not present

## 2024-11-29 DIAGNOSIS — O09522 Supervision of elderly multigravida, second trimester: Secondary | ICD-10-CM | POA: Diagnosis not present

## 2024-11-29 DIAGNOSIS — Z3A22 22 weeks gestation of pregnancy: Secondary | ICD-10-CM

## 2024-11-29 NOTE — Progress Notes (Signed)
 PRENATAL VISIT NOTE  Subjective:  Gabriela Thomas is a 43 y.o. H4E7977 at [redacted]w[redacted]d being seen today for ongoing prenatal care.  She is currently monitored for the following issues for this high-risk pregnancy and has Essential hypertension; Overweight (BMI 25.0-29.9); Rh negative state in antepartum period; Preexisting hypertension complicating pregnancy, antepartum; Previous cesarean section complicating pregnancy; Supervision of high risk pregnancy, antepartum; Pregnancy with history of uterine myomectomy; AMA (advanced maternal age) multigravida 106+; and Obesity in pregnancy, antepartum on their problem list.  Patient reports no complaints.  Contractions: Not present. Vag. Bleeding: None.  Movement: Present. Denies leaking of fluid.   The following portions of the patient's history were reviewed and updated as appropriate: allergies, current medications, past family history, past medical history, past social history, past surgical history and problem list.   Objective:   Vitals:   11/29/24 0921  BP: 114/75  Pulse: 79  Weight: 187 lb 9.6 oz (85.1 kg)    Fetal Status:  Fetal Heart Rate (bpm): 151   Movement: Present    General: Alert, oriented and cooperative. Patient is in no acute distress.  Skin: Skin is warm and dry. No rash noted.   Cardiovascular: Normal heart rate noted  Respiratory: Normal respiratory effort, no problems with respiration noted  Abdomen: Soft, gravid, appropriate for gestational age.  Pain/Pressure: Absent     Pelvic: Cervical exam deferred        Extremities: Normal range of motion.  Edema: None  Mental Status: Normal mood and affect. Normal behavior. Normal judgment and thought content.      09/06/2024   12:00 PM  Depression screen PHQ 2/9  Decreased Interest 0  Down, Depressed, Hopeless 0  PHQ - 2 Score 0  Altered sleeping 0  Tired, decreased energy 3  Change in appetite 0  Feeling bad or failure about yourself  0  Trouble concentrating 0  Moving  slowly or fidgety/restless 0  Suicidal thoughts 0  PHQ-9 Score 3   Difficult doing work/chores Not difficult at all     Data saved with a previous flowsheet row definition        09/06/2024   12:00 PM  GAD 7 : Generalized Anxiety Score  Nervous, Anxious, on Edge 1  Control/stop worrying 0  Worry too much - different things 1  Trouble relaxing 0  Restless 0  Easily annoyed or irritable 0  Afraid - awful might happen 0  Total GAD 7 Score 2  Anxiety Difficulty Not difficult at all    Assessment and Plan:  Pregnancy: H4E7977 at [redacted]w[redacted]d 1. [redacted] weeks gestation of pregnancy (Primary) 28wk labs next visit  2. Multigravida of advanced maternal age in second trimester Continue with serial growth u/s. Start qwk testing in 3rd trimester  3. Chronic hypertension complicating or reason for care during pregnancy, second trimester Doing well on labetalol  200 bid  4. Previous cesarean section complicating pregnancy See below  5. Pregnancy with history of uterine myomectomy Needs 37wk c/s  6. Rh negative state in antepartum period Rhogam and ab screen next visit  Preterm labor symptoms and general obstetric precautions including but not limited to vaginal bleeding, contractions, leaking of fluid and fetal movement were reviewed in detail with the patient. Please refer to After Visit Summary for other counseling recommendations.   No follow-ups on file.  Future Appointments  Date Time Provider Department Center  12/12/2024  9:00 AM [facility]  01/01/2025  8:55 AM Anyanwu, Gloris LABOR, MD [facility]  [facility]  01/09/2025  9:00 AM [facility]    Bebe Furry, MD

## 2024-12-12 ENCOUNTER — Ambulatory Visit

## 2024-12-12 ENCOUNTER — Other Ambulatory Visit: Payer: Self-pay

## 2024-12-12 ENCOUNTER — Ambulatory Visit: Admitting: Maternal & Fetal Medicine

## 2024-12-12 VITALS — BP 117/76 | HR 79

## 2024-12-12 DIAGNOSIS — O3429 Maternal care due to uterine scar from other previous surgery: Secondary | ICD-10-CM

## 2024-12-12 DIAGNOSIS — O10919 Unspecified pre-existing hypertension complicating pregnancy, unspecified trimester: Secondary | ICD-10-CM

## 2024-12-12 DIAGNOSIS — O34219 Maternal care for unspecified type scar from previous cesarean delivery: Secondary | ICD-10-CM

## 2024-12-12 DIAGNOSIS — O99210 Obesity complicating pregnancy, unspecified trimester: Secondary | ICD-10-CM

## 2024-12-12 DIAGNOSIS — O09522 Supervision of elderly multigravida, second trimester: Secondary | ICD-10-CM

## 2024-12-12 DIAGNOSIS — O0990 Supervision of high risk pregnancy, unspecified, unspecified trimester: Secondary | ICD-10-CM

## 2024-12-12 DIAGNOSIS — I10 Essential (primary) hypertension: Secondary | ICD-10-CM

## 2024-12-12 NOTE — Progress Notes (Signed)
 After review, MFM consult with provider is not indicated for today  Gabriela Nathanel Pipe, MD 12/12/2024 4:03 PM  [facility]

## 2024-12-25 ENCOUNTER — Other Ambulatory Visit

## 2024-12-25 ENCOUNTER — Ambulatory Visit (INDEPENDENT_AMBULATORY_CARE_PROVIDER_SITE_OTHER): Admitting: Family Medicine

## 2024-12-25 VITALS — BP 123/77 | HR 89 | Wt 185.6 lb

## 2024-12-25 DIAGNOSIS — O10919 Unspecified pre-existing hypertension complicating pregnancy, unspecified trimester: Secondary | ICD-10-CM

## 2024-12-25 DIAGNOSIS — Z3A26 26 weeks gestation of pregnancy: Secondary | ICD-10-CM

## 2024-12-25 DIAGNOSIS — O360931 Maternal care for other rhesus isoimmunization, third trimester, fetus 1: Secondary | ICD-10-CM

## 2024-12-25 DIAGNOSIS — O34219 Maternal care for unspecified type scar from previous cesarean delivery: Secondary | ICD-10-CM | POA: Diagnosis not present

## 2024-12-25 DIAGNOSIS — O09522 Supervision of elderly multigravida, second trimester: Secondary | ICD-10-CM

## 2024-12-25 DIAGNOSIS — O3429 Maternal care due to uterine scar from other previous surgery: Secondary | ICD-10-CM | POA: Diagnosis not present

## 2024-12-25 DIAGNOSIS — O26892 Other specified pregnancy related conditions, second trimester: Secondary | ICD-10-CM

## 2024-12-25 DIAGNOSIS — Z6791 Unspecified blood type, Rh negative: Secondary | ICD-10-CM | POA: Diagnosis not present

## 2024-12-25 DIAGNOSIS — O26899 Other specified pregnancy related conditions, unspecified trimester: Secondary | ICD-10-CM

## 2024-12-25 DIAGNOSIS — Z23 Encounter for immunization: Secondary | ICD-10-CM | POA: Diagnosis not present

## 2024-12-25 DIAGNOSIS — O10912 Unspecified pre-existing hypertension complicating pregnancy, second trimester: Secondary | ICD-10-CM | POA: Diagnosis not present

## 2024-12-25 DIAGNOSIS — O0992 Supervision of high risk pregnancy, unspecified, second trimester: Secondary | ICD-10-CM | POA: Diagnosis not present

## 2024-12-25 DIAGNOSIS — O0990 Supervision of high risk pregnancy, unspecified, unspecified trimester: Secondary | ICD-10-CM

## 2024-12-25 MED ORDER — RHO D IMMUNE GLOBULIN 1500 UNIT/2ML IJ SOSY
300.0000 ug | PREFILLED_SYRINGE | Freq: Once | INTRAMUSCULAR | Status: AC
  Administered 2024-12-25: 300 ug via INTRAMUSCULAR

## 2024-12-25 NOTE — Progress Notes (Signed)
 "  PRENATAL VISIT NOTE  Subjective:  Gabriela Thomas is a 43 y.o. [phone] at [redacted]w[redacted]d being seen today for ongoing prenatal care.  She is currently monitored for the following issues for this high-risk pregnancy and has Essential hypertension; Overweight (BMI 25.0-29.9); Rh negative state in antepartum period; Preexisting hypertension complicating pregnancy, antepartum; Previous cesarean section complicating pregnancy; Supervision of high risk pregnancy, antepartum; Pregnancy with history of uterine myomectomy; AMA (advanced maternal age) multigravida 72+; and Obesity in pregnancy, antepartum on their problem list.  Patient reports no complaints.  Contractions: Irritability. Vag. Bleeding: None.  Movement: Present. Denies leaking of fluid.   The following portions of the patient's history were reviewed and updated as appropriate: allergies, current medications, past family history, past medical history, past social history, past surgical history and problem list.   Objective:   Vitals:   12/25/24 0833  BP: 123/77  Pulse: 89  Weight: 185 lb 9.6 oz (84.2 kg)    Fetal Status:  Fetal Heart Rate (bpm): 158   Movement: Present    General: Alert, oriented and cooperative. Patient is in no acute distress.  Skin: Skin is warm and dry. No rash noted.   Cardiovascular: Normal heart rate noted  Respiratory: Normal respiratory effort, no problems with respiration noted  Abdomen: Soft, gravid, appropriate for gestational age.  Pain/Pressure: Absent     Pelvic: Cervical exam deferred        Extremities: Normal range of motion.  Edema: None  Mental Status: Normal mood and affect. Normal behavior. Normal judgment and thought content.      09/06/2024   12:00 PM  Depression screen PHQ 2/9  Decreased Interest 0  Down, Depressed, Hopeless 0  PHQ - 2 Score 0  Altered sleeping 0  Tired, decreased energy 3  Change in appetite 0  Feeling bad or failure about yourself  0  Trouble concentrating 0  Moving  slowly or fidgety/restless 0  Suicidal thoughts 0  PHQ-9 Score 3   Difficult doing work/chores Not difficult at all     Data saved with a previous flowsheet row definition        09/06/2024   12:00 PM  GAD 7 : Generalized Anxiety Score  Nervous, Anxious, on Edge 1  Control/stop worrying 0  Worry too much - different things 1  Trouble relaxing 0  Restless 0  Easily annoyed or irritable 0  Afraid - awful might happen 0  Total GAD 7 Score 2  Anxiety Difficulty Not difficult at all    Assessment and Plan:  Pregnancy: [id number] at [redacted]w[redacted]d 1. Rh negative state in antepartum period S/p Rhogam today - rho (d) immune globulin  (RHIG/RHOPHYLAC ) injection 300 mcg  2. Need for Tdap vaccination Given today - Tdap vaccine greater than or equal to 7yo IM  3. Preexisting hypertension complicating pregnancy, antepartum BP is well controlled on labetalol  On ASA EFW 32%  4. Supervision of high risk pregnancy, antepartum (Primary) Continue prenatal care. 28 week labs today  5. Previous cesarean section complicating pregnancy With myomectomy Plan for delivery @ 37 weeks, scheduled today - Ambulatory Referral For Surgery Scheduling  6. Pregnancy with history of uterine myomectomy Will need RCS  7. Multigravida of advanced maternal age in second trimester LR NIPT  8. [redacted] weeks gestation of pregnancy   Preterm labor symptoms and general obstetric precautions including but not limited to vaginal bleeding, contractions, leaking of fluid and fetal movement were reviewed in detail with the patient. Please refer to After Visit Summary  for other counseling recommendations.   Return in 2 weeks (on 01/08/2025).  Future Appointments  Date Time Provider Department Center  01/09/2025  9:00 AM [facility] [facility]  [facility] [place]    Glenys GORMAN Birk, MD  "

## 2024-12-26 LAB — CBC
Hematocrit: 36.7 % (ref 34.0–46.6)
Hemoglobin: 12.2 g/dL (ref 11.1–15.9)
MCH: 33.2 pg — ABNORMAL HIGH (ref 26.6–33.0)
MCHC: 33.2 g/dL (ref 31.5–35.7)
MCV: 100 fL — ABNORMAL HIGH (ref 79–97)
Platelets: 212 x10E3/uL (ref 150–450)
RBC: 3.67 x10E6/uL — ABNORMAL LOW (ref 3.77–5.28)
RDW: 12.7 % (ref 11.7–15.4)
WBC: 6.1 x10E3/uL (ref 3.4–10.8)

## 2024-12-26 LAB — SYPHILIS: RPR W/REFLEX TO RPR TITER AND TREPONEMAL ANTIBODIES, TRADITIONAL SCREENING AND DIAGNOSIS ALGORITHM: RPR Ser Ql: NONREACTIVE

## 2024-12-26 LAB — ANTIBODY SCREEN: Antibody Screen: NEGATIVE

## 2024-12-26 LAB — HIV ANTIBODY (ROUTINE TESTING W REFLEX): HIV Screen 4th Generation wRfx: NONREACTIVE

## 2024-12-26 LAB — GLUCOSE TOLERANCE, 2 HOURS W/ 1HR
Glucose, 1 hour: 88 mg/dL (ref 70–179)
Glucose, 2 hour: 89 mg/dL (ref 70–152)
Glucose, Fasting: 80 mg/dL (ref 70–91)

## 2025-01-01 ENCOUNTER — Encounter: Admitting: Obstetrics & Gynecology

## 2025-01-09 ENCOUNTER — Ambulatory Visit (INDEPENDENT_AMBULATORY_CARE_PROVIDER_SITE_OTHER): Admitting: Obstetrics and Gynecology

## 2025-01-09 ENCOUNTER — Ambulatory Visit: Admitting: Maternal & Fetal Medicine

## 2025-01-09 ENCOUNTER — Ambulatory Visit

## 2025-01-09 ENCOUNTER — Other Ambulatory Visit: Payer: Self-pay

## 2025-01-09 VITALS — BP 117/73 | HR 87 | Wt 185.0 lb

## 2025-01-09 VITALS — BP 132/83 | HR 89

## 2025-01-09 DIAGNOSIS — O34219 Maternal care for unspecified type scar from previous cesarean delivery: Secondary | ICD-10-CM

## 2025-01-09 DIAGNOSIS — O26893 Other specified pregnancy related conditions, third trimester: Secondary | ICD-10-CM | POA: Diagnosis not present

## 2025-01-09 DIAGNOSIS — O26899 Other specified pregnancy related conditions, unspecified trimester: Secondary | ICD-10-CM

## 2025-01-09 DIAGNOSIS — O10919 Unspecified pre-existing hypertension complicating pregnancy, unspecified trimester: Secondary | ICD-10-CM

## 2025-01-09 DIAGNOSIS — O0990 Supervision of high risk pregnancy, unspecified, unspecified trimester: Secondary | ICD-10-CM

## 2025-01-09 DIAGNOSIS — O10913 Unspecified pre-existing hypertension complicating pregnancy, third trimester: Secondary | ICD-10-CM | POA: Diagnosis not present

## 2025-01-09 DIAGNOSIS — O09522 Supervision of elderly multigravida, second trimester: Secondary | ICD-10-CM

## 2025-01-09 DIAGNOSIS — O3429 Maternal care due to uterine scar from other previous surgery: Secondary | ICD-10-CM

## 2025-01-09 DIAGNOSIS — Z6791 Unspecified blood type, Rh negative: Secondary | ICD-10-CM | POA: Diagnosis not present

## 2025-01-09 DIAGNOSIS — O09523 Supervision of elderly multigravida, third trimester: Secondary | ICD-10-CM

## 2025-01-09 DIAGNOSIS — O0993 Supervision of high risk pregnancy, unspecified, third trimester: Secondary | ICD-10-CM | POA: Diagnosis not present

## 2025-01-09 DIAGNOSIS — O99210 Obesity complicating pregnancy, unspecified trimester: Secondary | ICD-10-CM

## 2025-01-09 DIAGNOSIS — I10 Essential (primary) hypertension: Secondary | ICD-10-CM

## 2025-01-09 NOTE — Progress Notes (Signed)
 "  PRENATAL VISIT NOTE  Subjective:  Gabriela Thomas is a 44 y.o. [phone] at [redacted]w[redacted]d being seen today for ongoing prenatal care.  She is currently monitored for the following issues for this high-risk pregnancy and has Essential hypertension; Overweight (BMI 25.0-29.9); Rh negative state in antepartum period; Preexisting hypertension complicating pregnancy, antepartum; Previous cesarean section complicating pregnancy; Supervision of high risk pregnancy, antepartum; Pregnancy with history of uterine myomectomy; AMA (advanced maternal age) multigravida 29+; and Obesity in pregnancy, antepartum on their problem list.  Patient reports no complaints.  Contractions: Not present. Vag. Bleeding: None.  Movement: Present. Denies leaking of fluid.   The following portions of the patient's history were reviewed and updated as appropriate: allergies, current medications, past family history, past medical history, past social history, past surgical history and problem list.   Objective:   Vitals:   01/09/25 1328  BP: 117/73  Pulse: 87  Weight: 185 lb (83.9 kg)    Fetal Status:      Movement: Present    General: Alert, oriented and cooperative. Patient is in no acute distress.  Skin: Skin is warm and dry. No rash noted.   Cardiovascular: Normal heart rate noted  Respiratory: Normal respiratory effort, no problems with respiration noted  Abdomen: Soft, gravid, appropriate for gestational age.  Pain/Pressure: Absent     Pelvic: Cervical exam deferred        Extremities: Normal range of motion.     Mental Status: Normal mood and affect. Normal behavior. Normal judgment and thought content.      09/06/2024   12:00 PM  Depression screen PHQ 2/9  Decreased Interest 0  Down, Depressed, Hopeless 0  PHQ - 2 Score 0  Altered sleeping 0  Tired, decreased energy 3  Change in appetite 0  Feeling bad or failure about yourself  0  Trouble concentrating 0  Moving slowly or fidgety/restless 0  Suicidal  thoughts 0  PHQ-9 Score 3   Difficult doing work/chores Not difficult at all     Data saved with a previous flowsheet row definition        09/06/2024   12:00 PM  GAD 7 : Generalized Anxiety Score  Nervous, Anxious, on Edge 1  Control/stop worrying 0  Worry too much - different things 1  Trouble relaxing 0  Restless 0  Easily annoyed or irritable 0  Afraid - awful might happen 0  Total GAD 7 Score 2  Anxiety Difficulty Not difficult at all    Assessment and Plan:  Pregnancy: [id number] at [redacted]w[redacted]d 1. Preexisting hypertension complicating pregnancy, antepartum (Primary) Doing well on labetalol  200 bid 1/14: efw 12%, 1131g, ac 26%, afi 20.6  2. Supervision of high risk pregnancy, antepartum 28wk labs neg  3. Rh negative state in antepartum period S/p rhogam  4. Previous cesarean section complicating pregnancy 37wk rpt already scheduled  5. Pregnancy with history of uterine myomectomy  6. Multigravida of advanced maternal age in third trimester Start qwk testing at 34wks  Preterm labor symptoms and general obstetric precautions including but not limited to vaginal bleeding, contractions, leaking of fluid and fetal movement were reviewed in detail with the patient. Please refer to After Visit Summary for other counseling recommendations.   Return in about 2 weeks (around 01/23/2025) for in person, md or app, high risk ob.  Future Appointments  Date Time Provider Department Center  01/23/2025  8:15 AM Izell Harari, MD [facility]  02/06/2025  2:30 PM [facility]  [facility]  03/06/2025  9:00 AM [facility] [facility]  [facility] [place]    Bebe Furry, MD  "

## 2025-01-09 NOTE — Progress Notes (Signed)
 After review, MFM consult with provider is not indicated for today  Kizzie Nathanel Pipe, MD 01/09/2025 4:15 PM  [facility]

## 2025-01-12 ENCOUNTER — Encounter: Payer: Self-pay | Admitting: Obstetrics & Gynecology

## 2025-01-23 ENCOUNTER — Ambulatory Visit: Admitting: Obstetrics and Gynecology

## 2025-01-23 VITALS — BP 109/72 | HR 98 | Wt 186.0 lb

## 2025-01-23 DIAGNOSIS — O3429 Maternal care due to uterine scar from other previous surgery: Secondary | ICD-10-CM

## 2025-01-23 DIAGNOSIS — O34219 Maternal care for unspecified type scar from previous cesarean delivery: Secondary | ICD-10-CM

## 2025-01-23 DIAGNOSIS — O26899 Other specified pregnancy related conditions, unspecified trimester: Secondary | ICD-10-CM

## 2025-01-23 DIAGNOSIS — I10 Essential (primary) hypertension: Secondary | ICD-10-CM

## 2025-01-23 DIAGNOSIS — Z6831 Body mass index (BMI) 31.0-31.9, adult: Secondary | ICD-10-CM

## 2025-01-23 DIAGNOSIS — O99210 Obesity complicating pregnancy, unspecified trimester: Secondary | ICD-10-CM

## 2025-01-23 DIAGNOSIS — Z3A30 30 weeks gestation of pregnancy: Secondary | ICD-10-CM

## 2025-01-23 DIAGNOSIS — O365930 Maternal care for other known or suspected poor fetal growth, third trimester, not applicable or unspecified: Secondary | ICD-10-CM | POA: Insufficient documentation

## 2025-01-23 DIAGNOSIS — O09523 Supervision of elderly multigravida, third trimester: Secondary | ICD-10-CM

## 2025-01-23 NOTE — Progress Notes (Signed)
 "  PRENATAL VISIT NOTE  Subjective:  Gabriela Thomas is a 44 y.o. [phone] at [redacted]w[redacted]d being seen today for ongoing prenatal care.  She is currently monitored for the following issues for this high-risk pregnancy and has Essential hypertension; Overweight (BMI 25.0-29.9); Rh negative state in antepartum period; Preexisting hypertension complicating pregnancy, antepartum; Previous cesarean section complicating pregnancy; Supervision of high risk pregnancy, antepartum; Pregnancy with history of uterine myomectomy; AMA (advanced maternal age) multigravida 16+; Obesity in pregnancy, antepartum; and Poor fetal growth affecting management of mother in third trimester on their problem list.  Patient reports recurrent episodes of feeling hard to catch her breath. She had these s/s during her last pregnancy and was seen by Dr. Renford and had zio patch work up that showed PVCs and expectant management recommended and for the patient to let her know if s/s persisted postpartum; she was not on meds for her CHTN during that pregnancy. She states she had s/s after her last pregnancy too but s/s have come back starting late December. She confirms on her labetalol  200 bid, which she has been on during this pregnancy since early October.    Contractions: Not present. Vag. Bleeding: None.  Movement: Present. Denies leaking of fluid.   The following portions of the patient's history were reviewed and updated as appropriate: allergies, current medications, past family history, past medical history, past social history, past surgical history and problem list.   Objective:   Vitals:   01/23/25 0826  BP: 109/72  Pulse: 98  Weight: 186 lb (84.4 kg)    Fetal Status:  Fetal Heart Rate (bpm): 140s   Movement: Present    General: Alert, oriented and cooperative. Patient is in no acute distress.  Skin: Skin is warm and dry. No rash noted.   Cardiovascular: Normal heart rate noted. Normal s1 and s2, no MRGs  Respiratory:  CTAB Normal respiratory effort, no problems with respiration noted  Abdomen: Soft, gravid, appropriate for gestational age.  Pain/Pressure: Absent     Pelvic: Cervical exam deferred        Extremities: Normal range of motion.     Mental Status: Normal mood and affect. Normal behavior. Normal judgment and thought content.      09/06/2024   12:00 PM  Depression screen PHQ 2/9  Decreased Interest 0  Down, Depressed, Hopeless 0  PHQ - 2 Score 0  Altered sleeping 0  Tired, decreased energy 3  Change in appetite 0  Feeling bad or failure about yourself  0  Trouble concentrating 0  Moving slowly or fidgety/restless 0  Suicidal thoughts 0  PHQ-9 Score 3   Difficult doing work/chores Not difficult at all     Data saved with a previous flowsheet row definition        09/06/2024   12:00 PM  GAD 7 : Generalized Anxiety Score  Nervous, Anxious, on Edge 1   Control/stop worrying 0   Worry too much - different things 1   Trouble relaxing 0   Restless 0   Easily annoyed or irritable 0   Afraid - awful might happen 0   Total GAD 7 Score 2  Anxiety Difficulty Not difficult at all     Data saved with a previous flowsheet row definition   Assessment and Plan:  Pregnancy: [id number] at [redacted]w[redacted]d 1. [redacted] weeks gestation of pregnancy (Primary) 28wk labs wnl   2. Multigravida of advanced maternal age in third trimester Follow up growth on 2/11. To start weekly testing  then.  1/14: efw 12%, 1131g, ac 26%, afi 20.7  3. Essential hypertension See above. Will send a message to Dr. Renford for an appointment. ED precautions given  4. Obesity in pregnancy, antepartum  5. BMI 31.0-31.9,adult  6. Rh negative state in antepartum period S/p rhogam on 12/30.   7. Pregnancy with history of uterine myomectomy Already scheduled for rpt at 37wks  8. Previous cesarean section complicating pregnancy  9. Poor fetal growth See above  Preterm labor symptoms and general obstetric precautions including  but not limited to vaginal bleeding, contractions, leaking of fluid and fetal movement were reviewed in detail with the patient. Please refer to After Visit Summary for other counseling recommendations.   No follow-ups on file.  Future Appointments  Date Time Provider Department Center  02/06/2025  2:30 PM [facility] [facility]  [facility] [place]  03/06/2025  9:00 AM [facility] [facility]  [facility] [place]    Bebe Furry, MD  "

## 2025-02-05 ENCOUNTER — Encounter: Admitting: Obstetrics & Gynecology

## 2025-02-06 ENCOUNTER — Ambulatory Visit

## 2025-02-19 ENCOUNTER — Encounter: Admitting: Family Medicine

## 2025-03-06 ENCOUNTER — Ambulatory Visit

## 2025-03-07 ENCOUNTER — Encounter: Admitting: Obstetrics & Gynecology

## 2025-03-09 ENCOUNTER — Inpatient Hospital Stay (HOSPITAL_COMMUNITY): Admit: 2025-03-09 | Payer: Self-pay | Admitting: Obstetrics & Gynecology

## 2025-03-09 ENCOUNTER — Encounter (HOSPITAL_COMMUNITY): Payer: Self-pay

## 2025-03-09 DIAGNOSIS — O34219 Maternal care for unspecified type scar from previous cesarean delivery: Secondary | ICD-10-CM

## 2025-03-09 SURGERY — Surgical Case
Anesthesia: Regional | Laterality: Bilateral
